# Patient Record
Sex: Female | Born: 1935 | Race: White | Hispanic: No | Smoking: Never smoker
Health system: Southern US, Community
[De-identification: ages and names within clinical notes are randomized; demographics above are authoritative.]

## PROBLEM LIST (undated history)

## (undated) DIAGNOSIS — I5189 Other ill-defined heart diseases: Secondary | ICD-10-CM

## (undated) DIAGNOSIS — I10 Essential (primary) hypertension: Secondary | ICD-10-CM

## (undated) DIAGNOSIS — M1990 Unspecified osteoarthritis, unspecified site: Secondary | ICD-10-CM

## (undated) DIAGNOSIS — M549 Dorsalgia, unspecified: Secondary | ICD-10-CM

## (undated) DIAGNOSIS — N8110 Cystocele, unspecified: Secondary | ICD-10-CM

## (undated) DIAGNOSIS — D509 Iron deficiency anemia, unspecified: Secondary | ICD-10-CM

## (undated) DIAGNOSIS — I5032 Chronic diastolic (congestive) heart failure: Secondary | ICD-10-CM

## (undated) DIAGNOSIS — I517 Cardiomegaly: Secondary | ICD-10-CM

## (undated) DIAGNOSIS — R51 Headache: Secondary | ICD-10-CM

## (undated) DIAGNOSIS — D329 Benign neoplasm of meninges, unspecified: Secondary | ICD-10-CM

## (undated) DIAGNOSIS — G8929 Other chronic pain: Secondary | ICD-10-CM

## (undated) HISTORY — DX: Other ill-defined heart diseases: I51.89

## (undated) HISTORY — DX: Hypercalcemia: E83.52

## (undated) HISTORY — PX: OTHER SURGICAL HISTORY: SHX169

## (undated) HISTORY — PX: ABDOMINAL HYSTERECTOMY: SHX81

---

## 1999-02-17 ENCOUNTER — Encounter: Payer: Self-pay | Admitting: Orthopedic Surgery

## 1999-02-18 ENCOUNTER — Encounter: Payer: Self-pay | Admitting: Orthopedic Surgery

## 1999-02-18 ENCOUNTER — Inpatient Hospital Stay (HOSPITAL_COMMUNITY): Admission: RE | Admit: 1999-02-18 | Discharge: 1999-02-20 | Payer: Self-pay | Admitting: Orthopedic Surgery

## 2002-06-24 ENCOUNTER — Emergency Department (HOSPITAL_COMMUNITY): Admission: EM | Admit: 2002-06-24 | Discharge: 2002-06-24 | Payer: Self-pay | Admitting: *Deleted

## 2004-01-16 ENCOUNTER — Encounter (HOSPITAL_COMMUNITY): Admission: RE | Admit: 2004-01-16 | Discharge: 2004-02-15 | Payer: Self-pay | Admitting: Preventative Medicine

## 2004-02-04 ENCOUNTER — Ambulatory Visit: Admission: RE | Admit: 2004-02-04 | Discharge: 2004-02-04 | Payer: Self-pay | Admitting: Orthopaedic Surgery

## 2008-05-08 ENCOUNTER — Emergency Department (HOSPITAL_COMMUNITY): Admission: EM | Admit: 2008-05-08 | Discharge: 2008-05-08 | Payer: Self-pay | Admitting: Emergency Medicine

## 2010-06-25 ENCOUNTER — Emergency Department (HOSPITAL_COMMUNITY)
Admission: EM | Admit: 2010-06-25 | Discharge: 2010-06-25 | Payer: Self-pay | Source: Home / Self Care | Admitting: Emergency Medicine

## 2010-07-22 ENCOUNTER — Ambulatory Visit (HOSPITAL_COMMUNITY): Admission: RE | Admit: 2010-07-22 | Payer: Self-pay | Source: Home / Self Care | Admitting: Internal Medicine

## 2010-08-06 ENCOUNTER — Encounter: Payer: Self-pay | Admitting: Internal Medicine

## 2010-09-15 LAB — BASIC METABOLIC PANEL
CO2: 26 mEq/L (ref 19–32)
Chloride: 108 mEq/L (ref 96–112)
GFR calc Af Amer: >60 mL/min (ref >60)
GFR calc non Af Amer: 56 mL/min — ABNORMAL LOW (ref >60)
Potassium: 3.4 mEq/L — ABNORMAL LOW (ref 3.5–5.1)
Sodium: 143 mEq/L (ref 135–145)

## 2010-09-15 LAB — URINE CULTURE: Colony Count: 75000

## 2010-09-15 LAB — CBC
HCT: 39.3 % (ref 36.0–46.0)
MCH: 30.9 pg (ref 26.0–34.0)
MCHC: 33.8 g/dL (ref 30.0–36.0)
Platelets: 258 10*3/uL (ref 150–400)
RBC: 4.31 MIL/uL (ref 3.87–5.11)

## 2010-09-15 LAB — DIFFERENTIAL
Basophils Relative: 1 % (ref 0–1)
Lymphs Abs: 1.2 10*3/uL (ref 0.7–4.0)
Neutro Abs: 5.1 10*3/uL (ref 1.7–7.7)
Neutrophils Relative %: 74 % (ref 43–77)

## 2010-09-15 LAB — URINALYSIS, ROUTINE W REFLEX MICROSCOPIC
Hgb urine dipstick: NEGATIVE
Nitrite: NEGATIVE
Protein, ur: NEGATIVE mg/dL
Urobilinogen, UA: 0.2 mg/dL (ref 0.0–1.0)

## 2010-12-17 ENCOUNTER — Emergency Department (HOSPITAL_COMMUNITY): Payer: Medicare Other

## 2010-12-17 ENCOUNTER — Emergency Department (HOSPITAL_COMMUNITY)
Admission: EM | Admit: 2010-12-17 | Discharge: 2010-12-17 | Disposition: A | Discharge status: Home / Self Care | Payer: Medicare Other | Attending: Emergency Medicine | Admitting: Emergency Medicine

## 2010-12-17 DIAGNOSIS — I4949 Other premature depolarization: Secondary | ICD-10-CM | POA: Insufficient documentation

## 2010-12-17 DIAGNOSIS — I10 Essential (primary) hypertension: Secondary | ICD-10-CM | POA: Insufficient documentation

## 2010-12-17 DIAGNOSIS — J984 Other disorders of lung: Secondary | ICD-10-CM | POA: Insufficient documentation

## 2010-12-17 DIAGNOSIS — I700 Atherosclerosis of aorta: Secondary | ICD-10-CM | POA: Insufficient documentation

## 2010-12-17 DIAGNOSIS — M546 Pain in thoracic spine: Secondary | ICD-10-CM | POA: Insufficient documentation

## 2010-12-17 DIAGNOSIS — I252 Old myocardial infarction: Secondary | ICD-10-CM | POA: Insufficient documentation

## 2010-12-17 DIAGNOSIS — M129 Arthropathy, unspecified: Secondary | ICD-10-CM | POA: Insufficient documentation

## 2010-12-17 DIAGNOSIS — R071 Chest pain on breathing: Secondary | ICD-10-CM | POA: Insufficient documentation

## 2010-12-17 LAB — URINALYSIS, ROUTINE W REFLEX MICROSCOPIC
Bilirubin Urine: NEGATIVE
Glucose, UA: NEGATIVE mg/dL
Ketones, ur: NEGATIVE mg/dL
Leukocytes, UA: NEGATIVE
Specific Gravity, Urine: 1.015 (ref 1.005–1.030)
Urobilinogen, UA: 0.2 mg/dL (ref 0.0–1.0)
pH: 6.5 (ref 5.0–8.0)

## 2011-01-16 ENCOUNTER — Emergency Department (HOSPITAL_COMMUNITY)
Admission: EM | Admit: 2011-01-16 | Discharge: 2011-01-16 | Disposition: A | Discharge status: Home / Self Care | Payer: Medicare Other | Attending: Emergency Medicine | Admitting: Emergency Medicine

## 2011-01-16 ENCOUNTER — Encounter: Payer: Self-pay | Admitting: Emergency Medicine

## 2011-01-16 DIAGNOSIS — M546 Pain in thoracic spine: Secondary | ICD-10-CM | POA: Insufficient documentation

## 2011-01-16 DIAGNOSIS — M549 Dorsalgia, unspecified: Secondary | ICD-10-CM

## 2011-01-16 DIAGNOSIS — I10 Essential (primary) hypertension: Secondary | ICD-10-CM | POA: Insufficient documentation

## 2011-01-16 HISTORY — DX: Dorsalgia, unspecified: M54.9

## 2011-01-16 HISTORY — DX: Essential (primary) hypertension: I10

## 2011-01-16 HISTORY — DX: Other chronic pain: G89.29

## 2011-01-16 HISTORY — DX: Headache: R51

## 2011-01-16 HISTORY — DX: Unspecified osteoarthritis, unspecified site: M19.90

## 2011-01-16 MED ORDER — DIAZEPAM 5 MG PO TABS
5.0000 mg | ORAL_TABLET | Freq: Once | ORAL | Status: AC
  Administered 2011-01-16: 5 mg via ORAL
  Filled 2011-01-16: qty 1

## 2011-01-16 MED ORDER — OXYCODONE-ACETAMINOPHEN 5-325 MG PO TABS: 2.0000 | ORAL_TABLET | ORAL | Status: AC | PRN

## 2011-01-16 MED ORDER — METHOCARBAMOL 500 MG PO TABS: 500.0000 mg | ORAL_TABLET | Freq: Two times a day (BID) | ORAL | Status: AC

## 2011-01-16 MED ORDER — OXYCODONE-ACETAMINOPHEN 5-325 MG PO TABS
1.0000 | ORAL_TABLET | Freq: Once | ORAL | Status: AC
  Administered 2011-01-16: 1 via ORAL
  Filled 2011-01-16: qty 1

## 2011-01-16 NOTE — ED Notes (Signed)
Patient had to use restroom - urine specimen collected in case was ordered

## 2011-01-16 NOTE — ED Notes (Signed)
Pt c/o her chronic back pain since last night. Pain to L side mid area. States has this pain a lot. This episode started last night. Nad. C/o dizziness starting pta.

## 2011-01-16 NOTE — ED Provider Notes (Signed)
History     Chief Complaint  Patient presents with  . Back Pain   Patient is a 77 y.o. female presenting with back pain. The history is provided by the patient.  Back Pain  This is a chronic problem. The current episode started more than 1 week ago. The problem occurs constantly. The problem has not changed since onset.The pain is associated with no known injury. The pain is present in the thoracic spine. The quality of the pain is described as stabbing. The pain does not radiate. The pain is at a severity of 6/10. The pain is moderate. The symptoms are aggravated by bending and twisting. The pain is the same all the time. Stiffness is present all day. Pertinent negatives include no numbness, no bowel incontinence, no leg pain, no paresthesias, no tingling and no weakness. She has tried muscle relaxants for the symptoms. The treatment provided no relief.    Past Medical History  Diagnosis Date  . Back pain, chronic   . Headache   . Hypertension   . Arthritis     Past Surgical History  Procedure Date  . Abdominal hysterectomy   . Arm surgery     History reviewed. No pertinent family history.  History  Substance Use Topics  . Smoking status: Not on file  . Smokeless tobacco: Not on file  . Alcohol Use: No    OB History    Grav Para Term Preterm Abortions TAB SAB Ect Mult Living                  Review of Systems  Gastrointestinal: Negative for bowel incontinence.  Musculoskeletal: Positive for back pain.  Neurological: Negative for tingling, weakness, numbness and paresthesias.  All other systems reviewed and are negative.    Physical Exam  BP 166/79  Pulse 66  Temp(Src) 98 F (36.7 C) (Oral)  Resp 16  Ht 5\' 1"  (1.549 m)  Wt 102 lb (46.267 kg)  BMI 19.27 kg/m2  SpO2 99%  Physical Exam  Nursing note and vitals reviewed. Constitutional: She is oriented to person, place, and time. She appears well-developed and well-nourished.  Non-toxic appearance.  HENT:    Head: Normocephalic and atraumatic.  Eyes: Conjunctivae are normal. Pupils are equal, round, and reactive to light.  Neck: Normal range of motion.  Cardiovascular: Normal rate.   Pulmonary/Chest: Effort normal.  Abdominal: Soft. Normal appearance.  Musculoskeletal:       Thoracic back: She exhibits tenderness, pain and spasm. She exhibits no bony tenderness and no deformity.  Neurological: She is alert and oriented to person, place, and time. She has normal strength and normal reflexes. No sensory deficit.  Skin: Skin is warm and dry.  Psychiatric: She has a normal mood and affect.    ED Course  Procedures  MDM   Pt given pain meds and feels better, ambulatory      Toy Baker, MD 01/16/11 1620

## 2011-01-16 NOTE — ED Notes (Signed)
Pt standing at door, dressed. States she feels better and is ready to go. edp aware

## 2011-01-26 ENCOUNTER — Other Ambulatory Visit (HOSPITAL_COMMUNITY): Payer: Self-pay | Admitting: Orthopaedic Surgery

## 2011-01-26 DIAGNOSIS — M549 Dorsalgia, unspecified: Secondary | ICD-10-CM

## 2011-01-28 ENCOUNTER — Encounter (HOSPITAL_COMMUNITY)
Admission: RE | Admit: 2011-01-28 | Discharge: 2011-01-28 | Disposition: A | Discharge status: Home / Self Care | Payer: Medicare Other | Source: Ambulatory Visit | Attending: Orthopaedic Surgery | Admitting: Orthopaedic Surgery

## 2011-01-28 ENCOUNTER — Encounter (HOSPITAL_COMMUNITY): Payer: Self-pay

## 2011-01-28 DIAGNOSIS — M4120 Other idiopathic scoliosis, site unspecified: Secondary | ICD-10-CM | POA: Insufficient documentation

## 2011-01-28 DIAGNOSIS — M549 Dorsalgia, unspecified: Secondary | ICD-10-CM | POA: Insufficient documentation

## 2011-01-28 MED ORDER — TECHNETIUM TC 99M MEDRONATE IV KIT
25.0000 | PACK | Freq: Once | INTRAVENOUS | Status: AC | PRN
  Administered 2011-01-28: 25.1 via INTRAVENOUS

## 2011-04-08 LAB — POCT CARDIAC MARKERS: Myoglobin, poc: 174

## 2011-04-08 LAB — CREATININE, SERUM: GFR calc non Af Amer: >60

## 2011-07-06 ENCOUNTER — Other Ambulatory Visit (HOSPITAL_COMMUNITY): Payer: Self-pay | Admitting: Orthopaedic Surgery

## 2011-07-06 DIAGNOSIS — M8430XA Stress fracture, unspecified site, initial encounter for fracture: Secondary | ICD-10-CM

## 2011-07-08 ENCOUNTER — Encounter (HOSPITAL_COMMUNITY): Payer: Self-pay

## 2011-07-08 ENCOUNTER — Encounter (HOSPITAL_COMMUNITY)
Admission: RE | Admit: 2011-07-08 | Discharge: 2011-07-08 | Disposition: A | Discharge status: Home / Self Care | Payer: Medicare Other | Source: Ambulatory Visit | Attending: Orthopaedic Surgery | Admitting: Orthopaedic Surgery

## 2011-07-08 DIAGNOSIS — M8430XA Stress fracture, unspecified site, initial encounter for fracture: Secondary | ICD-10-CM | POA: Insufficient documentation

## 2011-07-08 MED ORDER — TECHNETIUM TC 99M MEDRONATE IV KIT
25.0000 | PACK | Freq: Once | INTRAVENOUS | Status: AC | PRN
  Administered 2011-07-08: 25 via INTRAVENOUS

## 2012-02-14 ENCOUNTER — Emergency Department (HOSPITAL_COMMUNITY)
Admission: EM | Admit: 2012-02-14 | Discharge: 2012-02-14 | Disposition: A | Discharge status: Home / Self Care | Payer: Medicare Other | Attending: Emergency Medicine | Admitting: Emergency Medicine

## 2012-02-14 ENCOUNTER — Encounter (HOSPITAL_COMMUNITY): Payer: Self-pay | Admitting: Emergency Medicine

## 2012-02-14 ENCOUNTER — Emergency Department (HOSPITAL_COMMUNITY): Payer: Medicare Other

## 2012-02-14 DIAGNOSIS — I10 Essential (primary) hypertension: Secondary | ICD-10-CM | POA: Insufficient documentation

## 2012-02-14 DIAGNOSIS — M549 Dorsalgia, unspecified: Secondary | ICD-10-CM

## 2012-02-14 DIAGNOSIS — K5900 Constipation, unspecified: Secondary | ICD-10-CM | POA: Insufficient documentation

## 2012-02-14 DIAGNOSIS — M546 Pain in thoracic spine: Secondary | ICD-10-CM | POA: Insufficient documentation

## 2012-02-14 DIAGNOSIS — G8929 Other chronic pain: Secondary | ICD-10-CM | POA: Insufficient documentation

## 2012-02-14 MED ORDER — PEG 3350-KCL-NABCB-NACL-NASULF 236 G PO SOLR: ORAL | Status: DC

## 2012-02-14 MED ORDER — HYDROCODONE-ACETAMINOPHEN 5-325 MG PO TABS: 1.0000 | ORAL_TABLET | ORAL | Status: AC | PRN

## 2012-02-14 NOTE — ED Provider Notes (Signed)
History   This chart was scribed for [name], MD scribed by Magnus Sinning. The patient was seen in room APA10/APA10 at 15:08    CSN: [id number]  Arrival date & time 02/14/12  1406   First MD Initiated Contact with Patient 02/14/12 1504      Chief Complaint  Patient presents with  . Back Pain    (Consider location/radiation/quality/duration/timing/severity/associated sxs/prior treatment) HPI Madison Hickman is a 78 y.o. female who presents to the Emergency Department complaining of gradually worsening chronic back pain ,located at  bilateral mid back , with associated constipation, onset yesterday. Patient explains she was constipated yesterday, and that she was relieved after taking a laxative. She explains shortly after taking the supplement and having BMs she began having increasingly worse back pain. Pt states she has history of gradually worsening arthritis in her back . Explains the back pain began 10 years ago on the left side and now intermittently moves from left to right. Pt reports she has been taking Vicodin provided by Dr. Hilda Lias  for treatment of back pain with moderate relief.    Orthopedic Surgeon: Dr. Hilda Lias      Past Medical History  Diagnosis Date  . Back pain, chronic   . Headache   . Hypertension   . Arthritis     Past Surgical History  Procedure Date  . Abdominal hysterectomy   . Arm surgery     History reviewed. No pertinent family history.  History  Substance Use Topics  . Smoking status: Not on file  . Smokeless tobacco: Not on file  . Alcohol Use: No    Review of Systems  All other systems reviewed and are negative.    Allergies  Review of patient's allergies indicates no known allergies.  Home Medications   Current Outpatient Rx  Name Route Sig Dispense Refill  . TRAMADOL HCL ER 100 MG PO TB24 Oral Take 100 mg by mouth daily.      Marland Kitchen UNKNOWN TO PATIENT  BP pill       BP 168/114  Pulse 81  Temp 98.4 F (36.9 C) (Oral)   Resp 18  SpO2 96%  Physical Exam  Nursing note and vitals reviewed. Constitutional: She is oriented to person, place, and time. She appears well-developed and well-nourished. No distress.  HENT:  Head: Normocephalic and atraumatic.  Eyes: Conjunctivae and EOM are normal.  Neck: Neck supple. No tracheal deviation present.  Cardiovascular: Normal rate.   Pulmonary/Chest: Effort normal. No respiratory distress.  Abdominal: She exhibits no distension.  Musculoskeletal: Normal range of motion.  Neurological: She is alert and oriented to person, place, and time. No sensory deficit.  Skin: Skin is dry.  Psychiatric: She has a normal mood and affect. Her behavior is normal.    ED Course  Procedures (including critical care time) DIAGNOSTIC STUDIES: Oxygen Saturation is 96% on room air, normal by my interpretation.    COORDINATION OF CARE:  Dg Abd 1 View  02/14/2012  *RADIOLOGY REPORT*  Clinical Data: Back pain.  ABDOMEN - 1 VIEW  Comparison: 05/08/2008  Findings: Prominent lumbar scoliosis and spondylosis noted.  Cardiomegaly is present. Prominence of stool throughout the colon suggests constipation.  No dilated small bowel is observed.  IMPRESSION:  1.  Prominent lumbar spondylosis and scoliosis. 2.  Prominence of stool throughout the colon suggests constipation. 3.  Cardiomegaly.  Original Report Authenticated By: Dellia Cloud, M.D.     1. Back pain   2. Constipation  MDM  Constipation, aggravated by use of narcotics. KUB x-ray will be obtained to evaluate stool burden.  KUB confirms a large amount of stool in the colon. She'll be sent home with prescription for GoLYTELY prescription for Norco. Active management of constipation.  No exam data present  [name], MD 02/14/12 1610

## 2012-02-14 NOTE — ED Notes (Signed)
Pt comes from home with c/o back pain and "constipation". Pt states she's had back pain "for years" but it is worse now. Pt has been taking percocet for the pain but it is no longer effective and her PCP doesn't want to give her anything else stronger. Pt states that she was constipated but her family gave her meds for the constipation and she is now having BMs. Pt states BMs are more formed than usual and painful but denies any blood in stool. Pt is A&O x4.

## 2012-02-14 NOTE — ED Notes (Signed)
Pt ambulated to restroom with assistance.  

## 2012-04-05 DIAGNOSIS — I5189 Other ill-defined heart diseases: Secondary | ICD-10-CM

## 2012-04-05 HISTORY — DX: Other ill-defined heart diseases: I51.89

## 2012-04-15 ENCOUNTER — Encounter (HOSPITAL_COMMUNITY): Payer: Self-pay

## 2012-04-15 ENCOUNTER — Inpatient Hospital Stay (HOSPITAL_COMMUNITY)
Admission: EM | Admit: 2012-04-15 | Discharge: 2012-04-20 | DRG: 291 | Disposition: A | Discharge status: Skilled Nursing Facility | Payer: Medicare Other | Attending: Internal Medicine | Admitting: Internal Medicine

## 2012-04-15 ENCOUNTER — Emergency Department (HOSPITAL_COMMUNITY): Payer: Medicare Other

## 2012-04-15 ENCOUNTER — Inpatient Hospital Stay (HOSPITAL_COMMUNITY): Payer: Medicare Other

## 2012-04-15 DIAGNOSIS — E611 Iron deficiency: Secondary | ICD-10-CM

## 2012-04-15 DIAGNOSIS — E876 Hypokalemia: Secondary | ICD-10-CM | POA: Diagnosis present

## 2012-04-15 DIAGNOSIS — R410 Disorientation, unspecified: Secondary | ICD-10-CM

## 2012-04-15 DIAGNOSIS — A498 Other bacterial infections of unspecified site: Secondary | ICD-10-CM | POA: Diagnosis present

## 2012-04-15 DIAGNOSIS — M129 Arthropathy, unspecified: Secondary | ICD-10-CM | POA: Diagnosis present

## 2012-04-15 DIAGNOSIS — Z7401 Bed confinement status: Secondary | ICD-10-CM

## 2012-04-15 DIAGNOSIS — R519 Headache, unspecified: Secondary | ICD-10-CM

## 2012-04-15 DIAGNOSIS — K2960 Other gastritis without bleeding: Secondary | ICD-10-CM | POA: Diagnosis present

## 2012-04-15 DIAGNOSIS — G92 Toxic encephalopathy: Secondary | ICD-10-CM | POA: Diagnosis present

## 2012-04-15 DIAGNOSIS — I10 Essential (primary) hypertension: Secondary | ICD-10-CM

## 2012-04-15 DIAGNOSIS — D649 Anemia, unspecified: Secondary | ICD-10-CM

## 2012-04-15 DIAGNOSIS — I5032 Chronic diastolic (congestive) heart failure: Secondary | ICD-10-CM | POA: Diagnosis present

## 2012-04-15 DIAGNOSIS — I359 Nonrheumatic aortic valve disorder, unspecified: Secondary | ICD-10-CM

## 2012-04-15 DIAGNOSIS — M549 Dorsalgia, unspecified: Secondary | ICD-10-CM | POA: Diagnosis present

## 2012-04-15 DIAGNOSIS — G936 Cerebral edema: Secondary | ICD-10-CM | POA: Diagnosis present

## 2012-04-15 DIAGNOSIS — R404 Transient alteration of awareness: Secondary | ICD-10-CM | POA: Diagnosis present

## 2012-04-15 DIAGNOSIS — C700 Malignant neoplasm of cerebral meninges: Secondary | ICD-10-CM | POA: Diagnosis present

## 2012-04-15 DIAGNOSIS — D509 Iron deficiency anemia, unspecified: Secondary | ICD-10-CM | POA: Diagnosis present

## 2012-04-15 DIAGNOSIS — I509 Heart failure, unspecified: Secondary | ICD-10-CM

## 2012-04-15 DIAGNOSIS — I959 Hypotension, unspecified: Secondary | ICD-10-CM | POA: Diagnosis present

## 2012-04-15 DIAGNOSIS — Z90710 Acquired absence of both cervix and uterus: Secondary | ICD-10-CM

## 2012-04-15 DIAGNOSIS — G929 Unspecified toxic encephalopathy: Secondary | ICD-10-CM | POA: Diagnosis present

## 2012-04-15 DIAGNOSIS — R1319 Other dysphagia: Secondary | ICD-10-CM | POA: Diagnosis present

## 2012-04-15 DIAGNOSIS — E538 Deficiency of other specified B group vitamins: Secondary | ICD-10-CM | POA: Diagnosis present

## 2012-04-15 DIAGNOSIS — D329 Benign neoplasm of meninges, unspecified: Secondary | ICD-10-CM | POA: Diagnosis present

## 2012-04-15 DIAGNOSIS — Z66 Do not resuscitate: Secondary | ICD-10-CM | POA: Diagnosis present

## 2012-04-15 DIAGNOSIS — N390 Urinary tract infection, site not specified: Secondary | ICD-10-CM | POA: Diagnosis present

## 2012-04-15 DIAGNOSIS — R1310 Dysphagia, unspecified: Secondary | ICD-10-CM | POA: Diagnosis present

## 2012-04-15 DIAGNOSIS — R51 Headache: Secondary | ICD-10-CM | POA: Diagnosis present

## 2012-04-15 DIAGNOSIS — K5900 Constipation, unspecified: Secondary | ICD-10-CM | POA: Diagnosis present

## 2012-04-15 DIAGNOSIS — I5031 Acute diastolic (congestive) heart failure: Principal | ICD-10-CM | POA: Diagnosis present

## 2012-04-15 DIAGNOSIS — R6881 Early satiety: Secondary | ICD-10-CM | POA: Diagnosis present

## 2012-04-15 DIAGNOSIS — Z23 Encounter for immunization: Secondary | ICD-10-CM

## 2012-04-15 DIAGNOSIS — Z87891 Personal history of nicotine dependence: Secondary | ICD-10-CM

## 2012-04-15 DIAGNOSIS — Z79899 Other long term (current) drug therapy: Secondary | ICD-10-CM

## 2012-04-15 DIAGNOSIS — K219 Gastro-esophageal reflux disease without esophagitis: Secondary | ICD-10-CM | POA: Diagnosis present

## 2012-04-15 DIAGNOSIS — R079 Chest pain, unspecified: Secondary | ICD-10-CM

## 2012-04-15 DIAGNOSIS — G8929 Other chronic pain: Secondary | ICD-10-CM | POA: Diagnosis present

## 2012-04-15 HISTORY — DX: Benign neoplasm of meninges, unspecified: D32.9

## 2012-04-15 HISTORY — DX: Iron deficiency anemia, unspecified: D50.9

## 2012-04-15 LAB — HEPATIC FUNCTION PANEL
ALT: 10 U/L (ref 0–35)
Albumin: 2.9 g/dL — ABNORMAL LOW (ref 3.5–5.2)
Alkaline Phosphatase: 56 U/L (ref 39–117)
Indirect Bilirubin: 0.3 mg/dL (ref 0.3–0.9)
Total Bilirubin: 0.4 mg/dL (ref 0.3–1.2)
Total Protein: 5.7 g/dL — ABNORMAL LOW (ref 6.0–8.3)

## 2012-04-15 LAB — CBC WITH DIFFERENTIAL/PLATELET
Basophils Relative: 1 % (ref 0–1)
HCT: 23.6 % — ABNORMAL LOW (ref 36.0–46.0)
Hemoglobin: 7.1 g/dL — ABNORMAL LOW (ref 12.0–15.0)
Lymphocytes Relative: 24 % (ref 12–46)
Lymphs Abs: 1.1 10*3/uL (ref 0.7–4.0)
MCHC: 30.1 g/dL (ref 30.0–36.0)
Monocytes Absolute: 0.5 10*3/uL (ref 0.1–1.0)
Monocytes Relative: 10 % (ref 3–12)
Neutro Abs: 2.8 10*3/uL (ref 1.7–7.7)
Neutrophils Relative %: 61 % (ref 43–77)
RBC: 3.14 MIL/uL — ABNORMAL LOW (ref 3.87–5.11)
WBC: 4.6 10*3/uL (ref 4.0–10.5)

## 2012-04-15 LAB — VITAMIN B12: Vitamin B-12: 163 pg/mL — ABNORMAL LOW (ref 211–911)

## 2012-04-15 LAB — BASIC METABOLIC PANEL
BUN: 15 mg/dL (ref 6–23)
CO2: 28 mEq/L (ref 19–32)
Chloride: 109 mEq/L (ref 96–112)
GFR calc Af Amer: 85 mL/min — ABNORMAL LOW (ref >90)
Potassium: 2.8 mEq/L — ABNORMAL LOW (ref 3.5–5.1)

## 2012-04-15 LAB — URINALYSIS, ROUTINE W REFLEX MICROSCOPIC
Glucose, UA: NEGATIVE mg/dL
Leukocytes, UA: NEGATIVE
Protein, ur: NEGATIVE mg/dL
Specific Gravity, Urine: 1.015 (ref 1.005–1.030)
Urobilinogen, UA: 0.2 mg/dL (ref 0.0–1.0)

## 2012-04-15 LAB — URINE MICROSCOPIC-ADD ON

## 2012-04-15 LAB — FOLATE: Folate: >20 ng/mL

## 2012-04-15 LAB — TYPE AND SCREEN: Antibody Screen: NEGATIVE

## 2012-04-15 LAB — RETICULOCYTES
RBC.: 3.45 MIL/uL — ABNORMAL LOW (ref 3.87–5.11)
Retic Count, Absolute: 41.4 10*3/uL (ref 19.0–186.0)

## 2012-04-15 LAB — MRSA PCR SCREENING: MRSA by PCR: NEGATIVE

## 2012-04-15 LAB — TROPONIN I: Troponin I: <0.3 ng/mL (ref <0.30)

## 2012-04-15 MED ORDER — PNEUMOCOCCAL VAC POLYVALENT 25 MCG/0.5ML IJ INJ
0.5000 mL | INJECTION | Freq: Once | INTRAMUSCULAR | Status: AC
  Administered 2012-04-15: 0.5 mL via INTRAMUSCULAR
  Filled 2012-04-15: qty 0.5

## 2012-04-15 MED ORDER — SODIUM CHLORIDE 0.9 % IJ SOLN
3.0000 mL | Freq: Two times a day (BID) | INTRAMUSCULAR | Status: DC
  Administered 2012-04-15 – 2012-04-18 (×4): 3 mL via INTRAVENOUS
  Filled 2012-04-15 (×5): qty 3

## 2012-04-15 MED ORDER — NITROGLYCERIN IN D5W 200-5 MCG/ML-% IV SOLN
2.0000 ug/min | INTRAVENOUS | Status: DC
  Administered 2012-04-15: 5 ug/min via INTRAVENOUS
  Filled 2012-04-15: qty 250

## 2012-04-15 MED ORDER — ACETAMINOPHEN 325 MG PO TABS
650.0000 mg | ORAL_TABLET | Freq: Four times a day (QID) | ORAL | Status: DC | PRN
  Administered 2012-04-15 – 2012-04-19 (×3): 650 mg via ORAL
  Filled 2012-04-15 (×3): qty 2

## 2012-04-15 MED ORDER — ONDANSETRON HCL 4 MG PO TABS: 4.0000 mg | ORAL_TABLET | Freq: Four times a day (QID) | ORAL | Status: DC | PRN

## 2012-04-15 MED ORDER — ASPIRIN 81 MG PO CHEW
324.0000 mg | CHEWABLE_TABLET | Freq: Once | ORAL | Status: AC
  Administered 2012-04-15: 324 mg via ORAL
  Filled 2012-04-15: qty 4

## 2012-04-15 MED ORDER — POTASSIUM CHLORIDE CRYS ER 20 MEQ PO TBCR
40.0000 meq | EXTENDED_RELEASE_TABLET | Freq: Three times a day (TID) | ORAL | Status: DC
  Administered 2012-04-15 (×2): 40 meq via ORAL
  Filled 2012-04-15 (×2): qty 2

## 2012-04-15 MED ORDER — NITROGLYCERIN 0.4 MG SL SUBL
0.4000 mg | SUBLINGUAL_TABLET | Freq: Once | SUBLINGUAL | Status: AC
  Administered 2012-04-15: 0.4 mg via SUBLINGUAL
  Filled 2012-04-15: qty 25

## 2012-04-15 MED ORDER — TRAMADOL HCL 50 MG PO TABS
50.0000 mg | ORAL_TABLET | Freq: Once | ORAL | Status: AC
  Administered 2012-04-15: 50 mg via ORAL

## 2012-04-15 MED ORDER — ACETAMINOPHEN 650 MG RE SUPP: 650.0000 mg | Freq: Four times a day (QID) | RECTAL | Status: DC | PRN

## 2012-04-15 MED ORDER — POTASSIUM CHLORIDE 10 MEQ/100ML IV SOLN
10.0000 meq | Freq: Once | INTRAVENOUS | Status: AC
  Administered 2012-04-15: 10 meq via INTRAVENOUS
  Filled 2012-04-15: qty 100

## 2012-04-15 MED ORDER — NITROGLYCERIN IN D5W 200-5 MCG/ML-% IV SOLN
5.0000 ug/min | Freq: Once | INTRAVENOUS | Status: AC
  Administered 2012-04-15: 5 ug/min via INTRAVENOUS
  Filled 2012-04-15: qty 250

## 2012-04-15 MED ORDER — ASPIRIN EC 81 MG PO TBEC
81.0000 mg | DELAYED_RELEASE_TABLET | Freq: Every day | ORAL | Status: DC
  Administered 2012-04-16: 81 mg via ORAL
  Filled 2012-04-15: qty 1

## 2012-04-15 MED ORDER — INFLUENZA VIRUS VACC SPLIT PF IM SUSP
0.2500 mL | Freq: Once | INTRAMUSCULAR | Status: AC
  Administered 2012-04-15: 0.25 mL via INTRAMUSCULAR
  Filled 2012-04-15: qty 0.25

## 2012-04-15 MED ORDER — FUROSEMIDE 10 MG/ML IJ SOLN
40.0000 mg | Freq: Two times a day (BID) | INTRAMUSCULAR | Status: DC
  Administered 2012-04-15 – 2012-04-16 (×2): 40 mg via INTRAVENOUS
  Filled 2012-04-15 (×2): qty 4

## 2012-04-15 MED ORDER — NITROFURANTOIN MONOHYD MACRO 100 MG PO CAPS
100.0000 mg | ORAL_CAPSULE | Freq: Two times a day (BID) | ORAL | Status: DC
  Administered 2012-04-15 – 2012-04-16 (×2): 100 mg via ORAL
  Filled 2012-04-15 (×2): qty 1

## 2012-04-15 MED ORDER — TRAMADOL HCL 50 MG PO TABS
50.0000 mg | ORAL_TABLET | Freq: Four times a day (QID) | ORAL | Status: DC | PRN
  Administered 2012-04-15: 50 mg via ORAL
  Filled 2012-04-15 (×2): qty 1

## 2012-04-15 MED ORDER — MAGNESIUM HYDROXIDE 400 MG/5ML PO SUSP
30.0000 mL | Freq: Every day | ORAL | Status: DC | PRN
  Administered 2012-04-15: 30 mL via ORAL
  Filled 2012-04-15: qty 30

## 2012-04-15 MED ORDER — ALUM & MAG HYDROXIDE-SIMETH 200-200-20 MG/5ML PO SUSP
30.0000 mL | Freq: Four times a day (QID) | ORAL | Status: DC | PRN
  Administered 2012-04-15 – 2012-04-16 (×2): 30 mL via ORAL
  Filled 2012-04-15 (×2): qty 30

## 2012-04-15 MED ORDER — DOCUSATE SODIUM 100 MG PO CAPS
100.0000 mg | ORAL_CAPSULE | Freq: Two times a day (BID) | ORAL | Status: DC
  Administered 2012-04-15 – 2012-04-16 (×3): 100 mg via ORAL
  Filled 2012-04-15 (×4): qty 1

## 2012-04-15 MED ORDER — ALBUTEROL SULFATE (5 MG/ML) 0.5% IN NEBU: 2.5000 mg | INHALATION_SOLUTION | RESPIRATORY_TRACT | Status: DC | PRN

## 2012-04-15 MED ORDER — POTASSIUM CHLORIDE CRYS ER 20 MEQ PO TBCR
40.0000 meq | EXTENDED_RELEASE_TABLET | Freq: Once | ORAL | Status: AC
  Administered 2012-04-15: 40 meq via ORAL
  Filled 2012-04-15: qty 2

## 2012-04-15 MED ORDER — TRAMADOL HCL 50 MG PO TABS
ORAL_TABLET | ORAL | Status: AC
  Administered 2012-04-15: 50 mg via ORAL
  Filled 2012-04-15: qty 1

## 2012-04-15 MED ORDER — FUROSEMIDE 10 MG/ML IJ SOLN
20.0000 mg | Freq: Once | INTRAMUSCULAR | Status: AC
  Administered 2012-04-15: 20 mg via INTRAVENOUS

## 2012-04-15 MED ORDER — SODIUM CHLORIDE 0.9 % IJ SOLN
INTRAMUSCULAR | Status: AC
  Filled 2012-04-15: qty 3

## 2012-04-15 MED ORDER — DEXTROSE 5 % IV SOLN
1.0000 g | Freq: Once | INTRAVENOUS | Status: AC
  Administered 2012-04-15: 1 g via INTRAVENOUS
  Filled 2012-04-15: qty 10

## 2012-04-15 MED ORDER — ONDANSETRON HCL 4 MG/2ML IJ SOLN
4.0000 mg | Freq: Four times a day (QID) | INTRAMUSCULAR | Status: DC | PRN
  Administered 2012-04-15 (×2): 4 mg via INTRAVENOUS
  Filled 2012-04-15 (×2): qty 2

## 2012-04-15 MED ORDER — LISINOPRIL 10 MG PO TABS
10.0000 mg | ORAL_TABLET | Freq: Every day | ORAL | Status: DC
  Administered 2012-04-15: 10 mg via ORAL
  Filled 2012-04-15: qty 1

## 2012-04-15 MED ORDER — FUROSEMIDE 10 MG/ML IJ SOLN
20.0000 mg | Freq: Once | INTRAMUSCULAR | Status: DC
  Filled 2012-04-15: qty 2
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
## 2012-04-15 NOTE — ED Provider Notes (Signed)
History    This chart was scribed for Madison Booze, MD, MD by Smitty Pluck. The patient was seen in room APA14 and the patient's care was started at 7:17AM.   CSN: [id number]  Arrival date & time 04/15/12  4098      Chief Complaint  Patient presents with  . Shortness of Breath    (Consider location/radiation/quality/duration/timing/severity/associated sxs/prior treatment) Patient is a 78 y.o. female presenting with shortness of breath. The history is provided by the patient. No language interpreter was used.  Shortness of Breath  Associated symptoms include shortness of breath.   Madison Hickman is a 78 y.o. female who presents to the Emergency Department complaining of constant, moderate SOB onset 1 day ago at night. Pt reports having chest heaviness and nausea. Pt reports that walking relieves symptoms. Pt denies diaphoresis, cough, vomiting, fever and chills. She reports having swelling in bilateral legs.   PCP is Dr. Fransico Him   Past Medical History  Diagnosis Date  . Back pain, chronic   . Headache   . Hypertension   . Arthritis   . Atrial fibrillation     Past Surgical History  Procedure Date  . Abdominal hysterectomy   . Arm surgery     No family history on file.  History  Substance Use Topics  . Smoking status: Not on file  . Smokeless tobacco: Not on file  . Alcohol Use: No    OB History    Grav Para Term Preterm Abortions TAB SAB Ect Mult Living                  Review of Systems  Respiratory: Positive for shortness of breath.   Gastrointestinal: Positive for nausea.       Constipation  All other systems reviewed and are negative.    Allergies  Review of patient's allergies indicates no known allergies.  Home Medications   Current Outpatient Rx  Name Route Sig Dispense Refill  . PEG 3350-KCL-NABCB-NACL-NASULF 236 G PO SOLR  8 ounces every 15 minutes until you are passing clear liquid 4000 mL 0  . TRAMADOL HCL ER 100 MG PO TB24 Oral Take 100 mg  by mouth daily.      Marland Kitchen UNKNOWN TO PATIENT  BP pill       BP 189/85  Pulse 58  Temp 97.9 F (36.6 C) (Oral)  Resp 20  Ht 5\' 7"  (1.702 m)  Wt 95 lb (43.092 kg)  BMI 14.88 kg/m2  SpO2 95%  Physical Exam  Nursing note and vitals reviewed. Constitutional: She is oriented to person, place, and time. She appears well-developed and well-nourished. No distress.  HENT:  Head: Normocephalic and atraumatic.  Eyes: EOM are normal. Pupils are equal, round, and reactive to light.  Neck: Normal range of motion. Neck supple. No tracheal deviation present.  Cardiovascular: Normal rate.        occasional premature beats with loud S4  Pulmonary/Chest: Effort normal. No respiratory distress. She has rales (bibasilar and louder on the left).  Abdominal: Soft. She exhibits no distension. There is no tenderness.  Musculoskeletal: Normal range of motion.       +3 pitting edema   Neurological: She is alert and oriented to person, place, and time.  Skin: Skin is warm and dry. There is pallor.  Psychiatric: She has a normal mood and affect. Her behavior is normal.    ED Course  Procedures (including critical care time) DIAGNOSTIC STUDIES: Oxygen Saturation is 95% on [place],  normal by my interpretation.    COORDINATION OF CARE: 7:24 AM Discussed ED treatment with pt  7:35 AM Ordered:   Medications  ibuprofen (ADVIL,MOTRIN) 200 MG tablet (not administered)  nitroGLYCERIN (NITROSTAT) SL tablet 0.4 mg (0.4 mg Sublingual Given 04/15/12 0741)  aspirin chewable tablet 324 mg (324 mg Oral Given 04/15/12 0741)  furosemide (LASIX) injection 20 mg (20 mg Intravenous Given 04/15/12 0743)       Labs Reviewed  CBC WITH DIFFERENTIAL - Abnormal; Notable for the following:    RBC 3.14 (*)     Hemoglobin 7.1 (*)     HCT 23.6 (*)     MCV 75.2 (*)     MCH 22.6 (*)     RDW 17.2 (*)     All other components within normal limits  BASIC METABOLIC PANEL - Abnormal; Notable for the following:    Potassium 2.8 (*)      GFR calc non Af Amer 73 (*)     GFR calc Af Amer 85 (*)     All other components within normal limits  PRO B NATRIURETIC PEPTIDE - Abnormal; Notable for the following:    Pro B Natriuretic peptide (BNP) 5133.0 (*)     All other components within normal limits  URINALYSIS, ROUTINE W REFLEX MICROSCOPIC - Abnormal; Notable for the following:    APPearance HAZY (*)     Nitrite POSITIVE (*)     All other components within normal limits  URINE MICROSCOPIC-ADD ON - Abnormal; Notable for the following:    Bacteria, UA MANY (*)     All other components within normal limits  TROPONIN I   Dg Chest Portable 1 View  04/15/2012  *RADIOLOGY REPORT*  Clinical Data: Shortness of breath  PORTABLE CHEST - 1 VIEW  Comparison: Prior chest x-ray 11/19/2011  Findings: Interval development of a increased pulmonary vascular congestion, cephalization and indistinctness of the interstitial markings suggesting mild interstitial edema.  Marked enlargement of the cardiopericardial silhouette is similar to prior.  Enlarged main central pulmonary arteries.  Ectatic, tortuous and atherosclerotic thoracic aorta.  Developing linear opacities in the bilateral bases.  Background changes of hyperinflation and chronic coarsening of the interstitial markings persist consistent with underlying COPD/emphysema.  No acute osseous abnormality.  Bones appear osteopenic.  IMPRESSION:  1.  Mild interstitial edema and unchanged cardiomegaly suggest mild CHF. 2.  Background changes of underlying COPD/emphysema persist 3.  Linear opacities the bilateral bases most consistent with subsegmental atelectasis 4.  Ectatic, atherosclerotic and highly tortuous thoracic aorta   Original Report Authenticated By: Sterling Big, M.D.     Date: 04/15/2012  Rate: 62  Rhythm: normal sinus rhythm and sinus arrhythmia  QRS Axis: normal  Intervals: normal  ST/T Wave abnormalities: ST depression in anterolateral leads  Conduction Disutrbances:none   Narrative Interpretation: Left ventricular hypertrophy, ST depression in anterolateral leads worrisome for ischemia. When compared with ECG of 12/17/2010, nonspecific T-wave changes are no longer present, ST depression is now present.  Old EKG Reviewed: changes noted     1. Congestive heart failure   2. Urinary tract infection   3. Hypokalemia   4. Anemia       MDM  Dyspnea which seems most likely to be due to congestive heart failure based on rales and peripheral edema. Chest heaviness is worrisome for angina pectoris. She will be given Lasix and nitroglycerin and reassessed. Cardiac markers have been ordered.  She got good relief from nitroglycerin and has had a good  he diuresis from low-dose furosemide. She states she is feeling much better. Severe anemia is noted and this may be contributing to her congestive heart failure. Rectal exam is done showing suddenly decreased sphincter tone, no masses. Stool was normal color and Hemoccult negative. Anemia panel has been drawn. She will need to be admitted and consideration will need to be given to transfusion. Oral and IV potassium of been given to treat hypokalemia. Ceftriaxone has been given to treat urinary tract infection.  Is discussed with Dr. Lendell Caprice who agrees to admit the patient.  I personally performed the services described in this documentation, which was scribed in my presence. The recorded information has been reviewed and considered.         Madison Booze, MD 04/15/12 [phone]

## 2012-04-15 NOTE — ED Notes (Signed)
Dr. Sullivan at bedside to examine.   

## 2012-04-15 NOTE — ED Notes (Signed)
C/o lower back pain which she states is chronic - states normally takes a pain pill every 4 hours at home for same.  EDP notified and orders rec'd.

## 2012-04-15 NOTE — Progress Notes (Signed)
PT REPORTS AND IS OBSERVED AT THIS TIME TO HAVE MUCH DIFFICULTY SWALLOWING. BECOMES EASILY CHOKED. SHE STATES SHE EATS SO LITTLE AT HOME. DIET CHANGED TO MECHANICAL SOFT FOR DINNER TO SEE IF THIS IMPROVES.

## 2012-04-15 NOTE — ED Notes (Signed)
Sob onset this am, denies cp,
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
## 2012-04-15 NOTE — Progress Notes (Signed)
*  PRELIMINARY RESULTS* Echocardiogram 2D Echocardiogram has been performed.  Madison Hickman M 04/15/2012, 2:26 PM

## 2012-04-15 NOTE — H&P (Signed)
Hospital Admission Note Date: 04/15/2012  Patient name: Madison Hickman Medical record number: [id number] Date of birth: 09-May-1924 Age: 78 y.o. Gender: female PCP: none  Attending physician: No att. providers found  Chief Complaint:   History of Present Illness:  Madison Hickman is an 78 y.o. female who presents to the ED with chest pressure lasting for about an hour. She was bowling close when it occurred. It is currently gone. She has no primary care physician. She has become progressively more short of breath with exertion over the past several weeks. Her legs have become more swollen over the past month. She has chest pain occasionally in the past, but not this severe. She has never had a cardiac workup. Her family members have been encouraging her to see a physician for years, but she only sees Dr. Hilda Lias for her chronic back pain. She has a history of hypertension, but has not been on medications recently. In the emergency room, she was found to have a BNP of over 5000. Hemoglobin 7.1. Heme-negative on rectal exam. Chest x-ray consistent with mild pulmonary edema. Blood pressure initially was 189/85. Urinalysis consistent with urinary tract infection. She was started on a nitroglycerin drip, given Lasix, Rocephin, aspirin. Troponin normal. EKG shows unchanged lateral T-wave inversion. Denies fevers, cough, bleeding. No known endoscopy.  Past Medical History  Diagnosis Date  . Back pain, chronic   . Headache   . Hypertension   . Arthritis   . Meningioma 04/15/2012    Meds: Prescriptions prior to admission  Medication Sig Dispense Refill  . ibuprofen (ADVIL,MOTRIN) 200 MG tablet Take 600 mg by mouth every 6 (six) hours as needed. For headache      . polyethylene glycol (GOLYTELY) 236 G solution 8 ounces every 15 minutes until you are passing clear liquid  4000 mL  0  . traMADol (ULTRAM-ER) 100 MG 24 hr tablet Take 100 mg by mouth daily.        Marland Kitchen UNKNOWN TO PATIENT BP pill          Allergies: Review of patient's allergies indicates no known allergies.  No family history on file. Past Surgical History  Procedure Date  . Abdominal hysterectomy   . Arm surgery     Review of Systems: Systems reviewed and as per HPI, otherwise negative.  Social history: Previous smoker, used to smoke about a pack and a half of cigarettes a day. Quit "a few years ago. Denies heavy alcohol use. Widowed. Lives with a son, who reportedly has psychiatric and medical problems. Patient is here with 2 daughters-in-law who voice concern about her home living situation. Close status is DO NOT RESUSCITATE.  Family history: Her mom had heart failure and died in her 4s. Her son has a heart problem and psychiatric issues. Her father died of "old age.  Past surgical history hysterectomy  Review of systems: In general no fevers chills weight loss HEENT: Benign brain tumor, chronic headache. Neck no neck stiffness or growths Respiratory as above Cardiovascular as above GI no change in appetite. No nausea vomiting diarrhea. No melena or hematochezia. GU no dysuria or hematuria. M Musculoskeletal: She suffers from chronic back pain and has an injury on her back which she attributes to a heating pad. Hematologic no history of bleeding or blood clots Endocrine no diabetes Skin no rash Neurologic no history of stroke or seizure Psychiatric denies depression hallucinations  Physical Exam: Blood pressure 165/88, pulse 53, temperature 98.2 F (36.8 C), temperature source Oral,  resp. rate 17, height 5\' 1"  (1.549 m), weight 45.8 kg (100 lb 15.5 oz), SpO2 92.00%. BP 165/88  Pulse 53  Temp 98.2 F (36.8 C) (Oral)  Resp 17  Ht 5\' 1"  (1.549 m)  Wt 45.8 kg (100 lb 15.5 oz)  BMI 19.08 kg/m2  SpO2 92%  General Appearance:    Alert, cooperative, no distress, appears stated age  Head:    Normocephalic, without obvious abnormality, atraumatic  Eyes:    PERRL, conjunctiva/corneas clear, EOM's  intact, fundi    benign, both eyes  Ears:    Normal TM's and external ear canals, both ears  Nose:   Nares normal, septum midline, mucosa normal, no drainage    or sinus tenderness  Throat:   Lips, mucosa, and tongue normal; teeth and gums normal  Neck:   Supple, symmetrical, trachea midline, no adenopathy;    thyroid:  no enlargement/tenderness/nodules; no carotid   bruit or JVD  Back:     Symmetric, no curvature, ROM normal, no CVA tenderness  Lungs:     Clear to auscultation bilaterally, respirations unlabored  Chest Wall:    No tenderness or deformity   Heart:    Regular rate and rhythm, S1 and S2 normal, no murmur, rub   or gallop  Breast Exam:    No tenderness, masses, or nipple abnormality  Abdomen:     Soft, non-tender, bowel sounds active all four quadrants,    no masses, no organomegaly  Genitalia:    Normal female without lesion, discharge or tenderness  Rectal:    Normal tone, normal prostate, no masses or tenderness;   guaiac negative stool  Extremities:   Extremities normal, atraumatic, no cyanosis or edema  Pulses:   2+ and symmetric all extremities  Skin:   Skin color, texture, turgor normal, no rashes or lesions  Lymph nodes:   Cervical, supraclavicular, and axillary nodes normal  Neurologic:   CNII-XII intact, normal strength, sensation and reflexes    throughout    Lab results: Basic Metabolic Panel:  Basename 04/15/12 0726  NA 144  K 2.8*  CL 109  CO2 28  GLUCOSE 90  BUN 15  CREATININE 0.79  CALCIUM 10.0  MG --  PHOS --    Basename 04/15/12 0726  WBC 4.6  NEUTROABS 2.8  HGB 7.1*  HCT 23.6*  MCV 75.2*  PLT 285   Cardiac Enzymes:  Basename 04/15/12 0726  CKTOTAL --  CKMB --  CKMBINDEX --  TROPONINI <0.30   BNP:  Basename 04/15/12 0726  PROBNP 5133.0*   Anemia Panel:  Basename 04/15/12 0830  VITAMINB12 --  FOLATE --  FERRITIN --  TIBC --  IRON --  RETICCTPCT 1.2   Urinalysis:  Basename 04/15/12 0758  COLORURINE YELLOW   LABSPEC 1.015  PHURINE 8.0  GLUCOSEU NEGATIVE  HGBUR NEGATIVE  BILIRUBINUR NEGATIVE  KETONESUR NEGATIVE  PROTEINUR NEGATIVE  UROBILINOGEN 0.2  NITRITE POSITIVE*  LEUKOCYTESUR NEGATIVE   Imaging results:  Dg Chest Portable 1 View  04/15/2012  *RADIOLOGY REPORT*  Clinical Data: Shortness of breath  PORTABLE CHEST - 1 VIEW  Comparison: Prior chest x-ray 11/19/2011  Findings: Interval development of a increased pulmonary vascular congestion, cephalization and indistinctness of the interstitial markings suggesting mild interstitial edema.  Marked enlargement of the cardiopericardial silhouette is similar to prior.  Enlarged main central pulmonary arteries.  Ectatic, tortuous and atherosclerotic thoracic aorta.  Developing linear opacities in the bilateral bases.  Background changes of hyperinflation and chronic coarsening of the  interstitial markings persist consistent with underlying COPD/emphysema.  No acute osseous abnormality.  Bones appear osteopenic.  IMPRESSION:  1.  Mild interstitial edema and unchanged cardiomegaly suggest mild CHF. 2.  Background changes of underlying COPD/emphysema persist 3.  Linear opacities the bilateral bases most consistent with subsegmental atelectasis 4.  Ectatic, atherosclerotic and highly tortuous thoracic aorta   Original Report Authenticated By: Sterling Big, M.D.    EKG: Normal sinus rhythm with sinus arrhythmia Minimal voltage criteria for LVH, may be normal variant ST & T wave abnormality, consider inferior ischemia  Assessment & Plan: Active Problems:  CHF (congestive heart failure), new onset: Will continue Lasix, nitroglycerin drip for blood pressure control and vasodilation. Admit to step down because of the nitroglycerin drip. Await echocardiogram. Add ACE inhibitor for accelerated blood pressure. Daily weights. Low salt diet. Oxygen. Check TSH.  Microcytic anemia, with heme-negative stool. Anemia panel is pending. No known history of  endoscopy. Will likely need endoscopy, but possibly outpatient.  Hypokalemia: Replete. Check magnesium level.  Malignant hypertension: See above.  Chest pain: Continue telemetry. Check serial cardiac enzymes.  UTI (urinary tract infection), mild. Rather asymptomatic. Will add Macrobid twice daily.  Chronic back pain: Stop nonsteroidal anti-inflammatories. Tylenol and tramadol is safe  Chronic headache  Meningioma: Last imaging of the brain was in 2009. Questionable home situation: Will consult physical therapy for evaluation. Consult social work.  Calisa Luckenbaugh L 04/15/2012, 3:38 PM

## 2012-04-15 NOTE — ED Notes (Signed)
Patient giving bedpan for urine specimen.

## 2012-04-16 DIAGNOSIS — I10 Essential (primary) hypertension: Secondary | ICD-10-CM

## 2012-04-16 DIAGNOSIS — R404 Transient alteration of awareness: Secondary | ICD-10-CM

## 2012-04-16 DIAGNOSIS — E611 Iron deficiency: Secondary | ICD-10-CM | POA: Diagnosis present

## 2012-04-16 DIAGNOSIS — R1310 Dysphagia, unspecified: Secondary | ICD-10-CM | POA: Diagnosis present

## 2012-04-16 DIAGNOSIS — E538 Deficiency of other specified B group vitamins: Secondary | ICD-10-CM | POA: Diagnosis present

## 2012-04-16 LAB — BASIC METABOLIC PANEL
Calcium: 10.1 mg/dL (ref 8.4–10.5)
Creatinine, Ser: 0.8 mg/dL (ref 0.50–1.10)
GFR calc Af Amer: 75 mL/min — ABNORMAL LOW (ref >90)
GFR calc non Af Amer: 65 mL/min — ABNORMAL LOW (ref >90)
Sodium: 143 mEq/L (ref 135–145)

## 2012-04-16 LAB — CBC
Platelets: 301 10*3/uL (ref 150–400)
RDW: 17.4 % — ABNORMAL HIGH (ref 11.5–15.5)
WBC: 5.7 10*3/uL (ref 4.0–10.5)

## 2012-04-16 MED ORDER — CYANOCOBALAMIN 1000 MCG/ML IJ SOLN
1000.0000 ug | Freq: Every day | INTRAMUSCULAR | Status: DC
  Administered 2012-04-16 – 2012-04-19 (×4): 1000 ug via INTRAMUSCULAR
  Filled 2012-04-16 (×4): qty 1

## 2012-04-16 MED ORDER — LISINOPRIL 10 MG PO TABS
20.0000 mg | ORAL_TABLET | Freq: Every day | ORAL | Status: DC
  Administered 2012-04-16: 20 mg via ORAL
  Filled 2012-04-16: qty 2

## 2012-04-16 MED ORDER — HYDROCODONE-ACETAMINOPHEN 5-325 MG PO TABS
1.0000 | ORAL_TABLET | Freq: Four times a day (QID) | ORAL | Status: DC | PRN
  Administered 2012-04-16 – 2012-04-20 (×6): 1 via ORAL
  Filled 2012-04-16 (×6): qty 1

## 2012-04-16 MED ORDER — POTASSIUM CHLORIDE 20 MEQ/15ML (10%) PO SOLN
ORAL | Status: AC
  Filled 2012-04-16: qty 30

## 2012-04-16 MED ORDER — SODIUM CHLORIDE 0.9 % IJ SOLN
INTRAMUSCULAR | Status: AC
  Administered 2012-04-16: 3 mL via INTRAVENOUS
  Filled 2012-04-16: qty 3

## 2012-04-16 MED ORDER — METOPROLOL TARTRATE 1 MG/ML IV SOLN
5.0000 mg | Freq: Four times a day (QID) | INTRAVENOUS | Status: DC
  Administered 2012-04-16 – 2012-04-18 (×6): 5 mg via INTRAVENOUS
  Filled 2012-04-16 (×7): qty 5

## 2012-04-16 MED ORDER — POTASSIUM CHLORIDE CRYS ER 10 MEQ PO TBCR: 10.0000 meq | EXTENDED_RELEASE_TABLET | Freq: Every day | ORAL | Status: DC

## 2012-04-16 MED ORDER — LORAZEPAM 2 MG/ML IJ SOLN
1.0000 mg | INTRAMUSCULAR | Status: DC | PRN
  Administered 2012-04-16 – 2012-04-18 (×7): 1 mg via INTRAVENOUS
  Administered 2012-04-19: via INTRAVENOUS
  Administered 2012-04-19 – 2012-04-20 (×3): 1 mg via INTRAVENOUS
  Filled 2012-04-16 (×11): qty 1

## 2012-04-16 MED ORDER — FUROSEMIDE 40 MG PO TABS: 40.0000 mg | ORAL_TABLET | Freq: Every day | ORAL | Status: DC

## 2012-04-16 MED ORDER — SODIUM CHLORIDE 0.9 % IJ SOLN
INTRAMUSCULAR | Status: AC
  Filled 2012-04-16: qty 9

## 2012-04-16 MED ORDER — POTASSIUM CHLORIDE CRYS ER 10 MEQ PO TBCR
10.0000 meq | EXTENDED_RELEASE_TABLET | Freq: Every day | ORAL | Status: DC
  Administered 2012-04-16: 10 meq via ORAL
  Filled 2012-04-16: qty 1

## 2012-04-16 MED ORDER — HALOPERIDOL LACTATE 5 MG/ML IJ SOLN
2.5000 mg | Freq: Once | INTRAMUSCULAR | Status: AC
  Administered 2012-04-16: 2.5 mg via INTRAVENOUS
  Filled 2012-04-16: qty 1

## 2012-04-16 NOTE — Progress Notes (Signed)
pt noted to have increased confusion and agitation. Pt hallucinating  about sons being in room. Attempt to reorient caused increased agitation. Pt pulled off scds, and monitor wires. Notified md of change  And of increased bp. New orders received.

## 2012-04-16 NOTE — Progress Notes (Signed)
Overnight, became confused, agitated and combative.  Haldol has not helped much. Son reported to RN that patient was on hydrocodone, not tramadol. No known benzodiazepines, and patient denied alcohol use yesterday while lucid. Nurse reported dysphagia and difficulty swallowing pills yesterday, so patient was to dysphagia diet with thickened liquids, crushing pills.  Subjective: Upset that she has safety mittens on. No headache. No shortness of breath. No chest pain. Some back pain.  Objective: Vital signs in last 24 hours: Filed Vitals:   04/16/12 0419 04/16/12 0500 04/16/12 0600 04/16/12 0750  BP:  184/92    Pulse:  76 79   Temp: 97.6 F (36.4 C)   98 F (36.7 C)  TempSrc: Oral   Oral  Resp:  22 20   Height:      Weight: 50 kg (110 lb 3.7 oz)     SpO2:  93% 92%    Weight change: 2.708 kg (5 lb 15.5 oz)  Intake/Output Summary (Last 24 hours) at 04/16/12 0757 Last data filed at 04/16/12 0710  Gross per 24 hour  Intake  764.5 ml  Output   5228 ml  Net -4463.5 ml   Telemetry, normal sinus rhythm  General: Patient is crouched at the end of the bed, lying sideways. Alert. Disoriented. Answers questions and follows a few commands Lungs: Clear to auscultation bilaterally without wheeze rhonchi or rales Cardiovascular regular rate rhythm without murmurs gallops rubs Abdomen soft nontender nondistended Extremities less edema.  Lab Results: Basic Metabolic Panel:  Lab 04/16/12 1610 04/15/12 1039 04/15/12 0726  NA 143 -- 144  K 4.3 -- 2.8*  CL 109 -- 109  CO2 26 -- 28  GLUCOSE 85 -- 90  BUN 14 -- 15  CREATININE 0.80 -- 0.79  CALCIUM 10.1 -- 10.0  MG -- 2.2 --  PHOS -- -- --   Liver Function Tests:  Lab 04/15/12 1039  AST 20  ALT 10  ALKPHOS 56  BILITOT 0.4  PROT 5.7*  ALBUMIN 2.9*   No results found for this basename: LIPASE:2,AMYLASE:2 in the last 168 hours No results found for this basename: AMMONIA:2 in the last 168 hours CBC:  Lab 04/16/12 0451 04/15/12 0726   WBC 5.7 4.6  NEUTROABS -- 2.8  HGB 7.6* 7.1*  HCT 26.2* 23.6*  MCV 76.4* 75.2*  PLT 301 285   Cardiac Enzymes:  Lab 04/15/12 1736 04/15/12 1231 04/15/12 0726  CKTOTAL -- -- --  CKMB -- -- --  CKMBINDEX -- -- --  TROPONINI <0.30 <0.30 <0.30   BNP:  Lab 04/15/12 0726  PROBNP 5133.0*   D-Dimer: No results found for this basename: DDIMER:2 in the last 168 hours CBG: No results found for this basename: GLUCAP:6 in the last 168 hours Hemoglobin A1C: No results found for this basename: HGBA1C in the last 168 hours Fasting Lipid Panel: No results found for this basename: CHOL,HDL,LDLCALC,TRIG,CHOLHDL,LDLDIRECT in the last 960 hours Thyroid Function Tests:  Lab 04/15/12 1039  TSH 1.805  T4TOTAL --  FREET4 --  T3FREE --  THYROIDAB --   Coagulation: No results found for this basename: LABPROT:4,INR:4 in the last 168 hours Anemia Panel:  Lab 04/15/12 0830  VITAMINB12 163*  FOLATE >20.0  FERRITIN 6*  TIBC 374  IRON 15*  RETICCTPCT 1.2   Urinalysis:  Lab 04/15/12 0758  COLORURINE YELLOW  LABSPEC 1.015  PHURINE 8.0  GLUCOSEU NEGATIVE  HGBUR NEGATIVE  BILIRUBINUR NEGATIVE  KETONESUR NEGATIVE  PROTEINUR NEGATIVE  UROBILINOGEN 0.2  NITRITE POSITIVE*  LEUKOCYTESUR NEGATIVE  Micro Results: Recent Results (from the past 240 hour(s))  MRSA PCR SCREENING     Status: Normal   Collection Time   04/15/12 12:53 PM      Component Value Range Status Comment   MRSA by PCR NEGATIVE  NEGATIVE Final    Studies/Results: US Venous Img Lower Bilateral  04/15/2012  *RADIOLOGY REPORT*  Clinical Data: Bilateral leg pain with edema.  VENOUS DUPLEX ULTRASOUND OF BILATERAL LOWER EXTREMITIES  Technique:  Gray-scale sonography with graded compression, as well as color Doppler and duplex ultrasound, were performed to evaluate the deep venous system of both lower extremities from the level of the common femoral vein through the popliteal and proximal calf veins.  Spectral Doppler was  utilized to evaluate flow at rest and with distal augmentation maneuvers.  Comparison:  None.  Findings: From the level of the common femoral vein to the popliteal vein, there is adequate color flow, compression and augmentation without evidence of a lower extremity deep venous thrombosis on either side.  Limited imaging of calf veins and superficial veins without thrombus.  Bilateral lower extremity edema.  IMPRESSION: No evidence of lower extremity deep venous thrombosis.  This is a call report.   Original Report Authenticated By: Fuller Canada, M.D.    Dg Chest Portable 1 View  04/15/2012  *RADIOLOGY REPORT*  Clinical Data: Shortness of breath  PORTABLE CHEST - 1 VIEW  Comparison: Prior chest x-ray 11/19/2011  Findings: Interval development of a increased pulmonary vascular congestion, cephalization and indistinctness of the interstitial markings suggesting mild interstitial edema.  Marked enlargement of the cardiopericardial silhouette is similar to prior.  Enlarged main central pulmonary arteries.  Ectatic, tortuous and atherosclerotic thoracic aorta.  Developing linear opacities in the bilateral bases.  Background changes of hyperinflation and chronic coarsening of the interstitial markings persist consistent with underlying COPD/emphysema.  No acute osseous abnormality.  Bones appear osteopenic.  IMPRESSION:  1.  Mild interstitial edema and unchanged cardiomegaly suggest mild CHF. 2.  Background changes of underlying COPD/emphysema persist 3.  Linear opacities the bilateral bases most consistent with subsegmental atelectasis 4.  Ectatic, atherosclerotic and highly tortuous thoracic aorta   Original Report Authenticated By: Sterling Big, M.D.    Echocardiogram result pending.  Scheduled Meds:   . aspirin EC  81 mg Oral Daily  . cefTRIAXone (ROCEPHIN) IVPB 1 gram/50 mL D5W  1 g Intravenous Once  . docusate sodium  100 mg Oral BID  . furosemide  40 mg Intravenous BID  . haloperidol lactate   2.5 mg Intravenous Once  . influenza  inactive virus vaccine  0.25 mL Intramuscular Once  . lisinopril  20 mg Oral Daily  . nitrofurantoin (macrocrystal-monohydrate)  100 mg Oral Q12H  . nitroGLYCERIN  5-200 mcg/min Intravenous Once  . pneumococcal 23 valent vaccine  0.5 mL Intramuscular Once  . potassium chloride  10 mEq Intravenous Once  . potassium chloride SA  40 mEq Oral Once  . potassium chloride  40 mEq Oral TID  . sodium chloride  3 mL Intravenous Q12H  . sodium chloride      . sodium chloride      . traMADol  50 mg Oral Once  . DISCONTD: lisinopril  10 mg Oral Daily   Continuous Infusions:   . nitroGLYCERIN 5 mcg/min (04/16/12 0600)   PRN Meds:.acetaminophen, acetaminophen, albuterol, alum & mag hydroxide-simeth, magnesium hydroxide, ondansetron (ZOFRAN) IV, ondansetron, traMADol Assessment/Plan: Active Problems:  CHF (congestive heart failure): Has diuresed over 4 L. Await  echocardiogram report. Will change Lasix to once daily. Lung sounds are clear. Edema has improved.  Microcytic anemia: Patient is iron deficient and B12 deficient. Will eventually need workup of the iron deficiency anemia. Patient is too confused at this time. Could even be done as an outpatient as she is not actively bleeding. Will start iron and B12 supplementation.  Hypokalemia resolved  Malignant hypertension not optimally controlled. May somewhat be related to agitation. Will increase lisinopril. We nitroglycerin drip as able.  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 Haldol has not helped much. Will try IV Ativan.  Chest pain, MI ruled out  UTI (urinary tract infection) continue antibiotics  Iron deficiency  B12 deficiency  Chronic back pain  Chronic headache  Meningioma needs repeat imaging electively. Too agitated at this time to cooperate. Dysphagia: Diet changed. Aspiration precautions. Speech consult therapy pending.   LOS: 1 day   Hoyle Barkdull L 04/16/2012, 7:57 AM

## 2012-04-17 ENCOUNTER — Inpatient Hospital Stay (HOSPITAL_COMMUNITY): Payer: Medicare Other

## 2012-04-17 DIAGNOSIS — R1310 Dysphagia, unspecified: Secondary | ICD-10-CM

## 2012-04-17 DIAGNOSIS — D320 Benign neoplasm of cerebral meninges: Secondary | ICD-10-CM

## 2012-04-17 LAB — BASIC METABOLIC PANEL
Chloride: 101 mEq/L (ref 96–112)
Creatinine, Ser: 0.75 mg/dL (ref 0.50–1.10)
GFR calc Af Amer: 86 mL/min — ABNORMAL LOW (ref >90)
Potassium: 3 mEq/L — ABNORMAL LOW (ref 3.5–5.1)
Sodium: 137 mEq/L (ref 135–145)

## 2012-04-17 LAB — HEMOGLOBIN AND HEMATOCRIT, BLOOD
HCT: 26.2 % — ABNORMAL LOW (ref 36.0–46.0)
Hemoglobin: 7.7 g/dL — ABNORMAL LOW (ref 12.0–15.0)

## 2012-04-17 MED ORDER — HYDRALAZINE HCL 20 MG/ML IJ SOLN
10.0000 mg | Freq: Four times a day (QID) | INTRAMUSCULAR | Status: DC | PRN
  Administered 2012-04-18: 10 mg via INTRAVENOUS
  Filled 2012-04-17: qty 1

## 2012-04-17 MED ORDER — FUROSEMIDE 10 MG/ML IJ SOLN
40.0000 mg | Freq: Every day | INTRAMUSCULAR | Status: DC
  Administered 2012-04-17: 40 mg via INTRAVENOUS
  Filled 2012-04-17: qty 4

## 2012-04-17 MED ORDER — SODIUM CHLORIDE 0.9 % IJ SOLN
INTRAMUSCULAR | Status: AC
  Filled 2012-04-17: qty 3

## 2012-04-17 MED ORDER — POTASSIUM CHLORIDE 10 MEQ/100ML IV SOLN
10.0000 meq | INTRAVENOUS | Status: AC
  Administered 2012-04-17 (×4): 10 meq via INTRAVENOUS
  Filled 2012-04-17 (×4): qty 100

## 2012-04-17 MED ORDER — ENALAPRILAT 1.25 MG/ML IV SOLN
0.6250 mg | Freq: Four times a day (QID) | INTRAVENOUS | Status: DC
  Administered 2012-04-17 – 2012-04-19 (×5): 0.625 mg via INTRAVENOUS
  Filled 2012-04-17 (×5): qty 2

## 2012-04-17 MED ORDER — PANTOPRAZOLE SODIUM 40 MG IV SOLR
40.0000 mg | INTRAVENOUS | Status: DC
  Administered 2012-04-17 – 2012-04-18 (×2): 40 mg via INTRAVENOUS
  Filled 2012-04-17 (×2): qty 40

## 2012-04-17 MED ORDER — ENALAPRILAT 1.25 MG/ML IV SOLN
1.2500 mg | Freq: Four times a day (QID) | INTRAVENOUS | Status: DC
  Administered 2012-04-17 (×2): 1.25 mg via INTRAVENOUS
  Filled 2012-04-17 (×3): qty 2

## 2012-04-17 MED ORDER — DEXTROSE 5 % IV SOLN
1.0000 g | INTRAVENOUS | Status: DC
  Administered 2012-04-17 – 2012-04-19 (×3): 1 g via INTRAVENOUS
  Filled 2012-04-17 (×4): qty 10

## 2012-04-17 NOTE — Progress Notes (Signed)
Remained agitated overnight. Not taking pills reliably. Required sedation. Discussed with patient's son, [name]. He is able to provide more history.  Subjective: Asleep. Son reports that she does not drink alcohol or take benzodiazepines. He reports that she gets confused at home occasionally, worse at night. He also reports that she has complained in the past that she has difficulty eating, with the sensation of food "getting stuck". Confirms that she does not take tramadol. Just hydrocodone.  Objective: Vital signs in last 24 hours: Filed Vitals:   04/17/12 0600 04/17/12 0700 04/17/12 0800 04/17/12 0804  BP: 176/91 174/96 133/72   Pulse: 83 79 74   Temp:    99.3 F (37.4 C)  TempSrc:    Axillary  Resp: 20 26 23    Height:      Weight:      SpO2: 93% 94% 95%    Weight change: 0.1 kg (3.5 oz)  Intake/Output Summary (Last 24 hours) at 04/17/12 0853 Last data filed at 04/17/12 0800  Gross per 24 hour  Intake  420.5 ml  Output   3850 ml  Net -3429.5 ml   Telemetry, normal sinus rhythm with pacs  General: asleep. arousable Lungs: Clear to auscultation bilaterally without wheeze rhonchi or rales Cardiovascular regular rate rhythm without murmurs gallops rubs Abdomen soft nontender nondistended Extremities less edema.  Lab Results: Basic Metabolic Panel:  Lab 04/17/12 1610 04/16/12 0451 04/15/12 1039  NA 137 143 --  K 3.0* 4.3 --  CL 101 109 --  CO2 26 26 --  GLUCOSE 161* 85 --  BUN 11 14 --  CREATININE 0.75 0.80 --  CALCIUM 10.5 10.1 --  MG -- -- 2.2  PHOS -- -- --   Liver Function Tests:  Lab 04/15/12 1039  AST 20  ALT 10  ALKPHOS 56  BILITOT 0.4  PROT 5.7*  ALBUMIN 2.9*   No results found for this basename: LIPASE:2,AMYLASE:2 in the last 168 hours No results found for this basename: AMMONIA:2 in the last 168 hours CBC:  Lab 04/17/12 0455 04/16/12 0451 04/15/12 0726  WBC -- 5.7 4.6  NEUTROABS -- -- 2.8  HGB 7.7* 7.6* --  HCT 26.2* 26.2* --  MCV -- 76.4*  75.2*  PLT -- 301 285   Cardiac Enzymes:  Lab 04/15/12 1736 04/15/12 1231 04/15/12 0726  CKTOTAL -- -- --  CKMB -- -- --  CKMBINDEX -- -- --  TROPONINI <0.30 <0.30 <0.30   BNP:  Lab 04/15/12 0726  PROBNP 5133.0*   D-Dimer: No results found for this basename: DDIMER:2 in the last 168 hours CBG: No results found for this basename: GLUCAP:6 in the last 168 hours Hemoglobin A1C: No results found for this basename: HGBA1C in the last 168 hours Fasting Lipid Panel: No results found for this basename: CHOL,HDL,LDLCALC,TRIG,CHOLHDL,LDLDIRECT in the last 960 hours Thyroid Function Tests:  Lab 04/15/12 1039  TSH 1.805  T4TOTAL --  FREET4 --  T3FREE --  THYROIDAB --   Coagulation: No results found for this basename: LABPROT:4,INR:4 in the last 168 hours Anemia Panel:  Lab 04/15/12 0830  VITAMINB12 163*  FOLATE >20.0  FERRITIN 6*  TIBC 374  IRON 15*  RETICCTPCT 1.2   Urinalysis:  Lab 04/15/12 0758  COLORURINE YELLOW  LABSPEC 1.015  PHURINE 8.0  GLUCOSEU NEGATIVE  HGBUR NEGATIVE  BILIRUBINUR NEGATIVE  KETONESUR NEGATIVE  PROTEINUR NEGATIVE  UROBILINOGEN 0.2  NITRITE POSITIVE*  LEUKOCYTESUR NEGATIVE   Micro Results: Recent Results (from the past 240 hour(s))  MRSA PCR  SCREENING     Status: Normal   Collection Time   04/15/12 12:53 PM      Component Value Range Status Comment   MRSA by PCR NEGATIVE  NEGATIVE Final    Studies/Results: US Venous Img Lower Bilateral  04/15/2012  *RADIOLOGY REPORT*  Clinical Data: Bilateral leg pain with edema.  VENOUS DUPLEX ULTRASOUND OF BILATERAL LOWER EXTREMITIES  Technique:  Gray-scale sonography with graded compression, as well as color Doppler and duplex ultrasound, were performed to evaluate the deep venous system of both lower extremities from the level of the common femoral vein through the popliteal and proximal calf veins.  Spectral Doppler was utilized to evaluate flow at rest and with distal augmentation maneuvers.   Comparison:  None.  Findings: From the level of the common femoral vein to the popliteal vein, there is adequate color flow, compression and augmentation without evidence of a lower extremity deep venous thrombosis on either side.  Limited imaging of calf veins and superficial veins without thrombus.  Bilateral lower extremity edema.  IMPRESSION: No evidence of lower extremity deep venous thrombosis.  This is a call report.   Original Report Authenticated By: Fuller Canada, M.D.    Echocardiogram result pending.  Scheduled Meds:    . cefTRIAXone (ROCEPHIN)  IV  1 g Intravenous Q24H  . cyanocobalamin  1,000 mcg Intramuscular Daily  . enalaprilat  1.25 mg Intravenous Q6H  . furosemide  40 mg Intravenous Daily  . metoprolol  5 mg Intravenous Q6H  . pantoprazole (PROTONIX) IV  40 mg Intravenous Q24H  . potassium chloride  10 mEq Intravenous Q1 Hr x 4  . sodium chloride  3 mL Intravenous Q12H  . sodium chloride      . DISCONTD: aspirin EC  81 mg Oral Daily  . DISCONTD: docusate sodium  100 mg Oral BID  . DISCONTD: furosemide  40 mg Oral Daily  . DISCONTD: lisinopril  20 mg Oral Daily  . DISCONTD: nitrofurantoin (macrocrystal-monohydrate)  100 mg Oral Q12H  . DISCONTD: potassium chloride  10 mEq Oral Daily  . DISCONTD: potassium chloride  10 mEq Oral Daily   Continuous Infusions:    . nitroGLYCERIN 66.667 mcg/min (04/17/12 0800)   PRN Meds:.acetaminophen, acetaminophen, albuterol, alum & mag hydroxide-simeth, HYDROcodone-acetaminophen, LORazepam, magnesium hydroxide, ondansetron (ZOFRAN) IV, ondansetron Assessment/Plan: Active Problems:  CHF (congestive heart failure), echocardiogram pending. Continue Lasix. Patient is diuresising well.   Microcytic anemia, stable without evidence of bleeding currently.   Hypokalemia: Replete IV, as patient is not taking medications.   Malignant hypertension: Patient was started on IV metoprolol last night. Will change ACE inhibitor to IV, do to  delirium and agitation, not taking medications by mouth. Try to wean off nitroglycerin drip. Will give when necessary IV hydralazine.   Acute delirium likely a combination of sundowner syndrome and toxic encephalopathy. Also however, patient has not had any imaging of her brain since 2009. She had a large meningioma in the right frontal area which may be contributing. Will get a CT of the brain today as patient is less agitated.   Chest pain, MI ruled out. Await echocardiogram.   UTI (urinary tract infection): Cultures pending. Switch back to IV antibiotics, she's not taking pills reliably.   Iron deficiency, with heme-negative stool. Given patient's history of oral versus esophageal dysphagia and continued delirium, oral repletion may not be feasible. Will order IV iron for now. Will need eventual upper and lower endoscopy. See below. empiric protoniX. Patient has no known history  of endoscopy.   B12 deficiency, receiving injections   Dysphagia: Nurse reported oral pharyngeal dysphasia symptoms. However, patient's son is reporting also symptoms concerning for esophageal stricture or other. Therefore, inpatient GI evaluation is warranted, once delirium and medical issues stabilized a bit.   Chronic back pain   Chronic headache   Meningioma: The patient's son reports that patient was evaluated by neurology 2009, who did not recommend resection. See above.  Disposition: According to patient's son, [name], patient cares for her mentally ill son, [name]. He has bipolar disorder and is unable to care for himself, or his mother. [name] reports that he is unable to stay with his mother. Social work will be reconsulted. It is not safe for patient to go back home prior to transfer to a facility, unless other family members are available to provide 24 hour supervision.    LOS: 2 days   Gloris Shiroma L 04/17/2012, 8:53 AM

## 2012-04-18 ENCOUNTER — Inpatient Hospital Stay (HOSPITAL_COMMUNITY)
Admit: 2012-04-18 | Discharge: 2012-04-18 | Disposition: A | Discharge status: Home / Self Care | Payer: Medicare Other | Attending: Neurology | Admitting: Neurology

## 2012-04-18 ENCOUNTER — Encounter (HOSPITAL_COMMUNITY): Payer: Self-pay | Admitting: Urgent Care

## 2012-04-18 ENCOUNTER — Encounter (HOSPITAL_COMMUNITY)
Admission: EM | Disposition: A | Discharge status: Skilled Nursing Facility | Payer: Self-pay | Source: Home / Self Care | Attending: Internal Medicine

## 2012-04-18 DIAGNOSIS — K2970 Gastritis, unspecified, without bleeding: Secondary | ICD-10-CM

## 2012-04-18 DIAGNOSIS — K2990 Gastroduodenitis, unspecified, without bleeding: Secondary | ICD-10-CM

## 2012-04-18 DIAGNOSIS — D509 Iron deficiency anemia, unspecified: Secondary | ICD-10-CM

## 2012-04-18 DIAGNOSIS — N390 Urinary tract infection, site not specified: Secondary | ICD-10-CM

## 2012-04-18 DIAGNOSIS — R1310 Dysphagia, unspecified: Secondary | ICD-10-CM

## 2012-04-18 HISTORY — PX: ESOPHAGOGASTRODUODENOSCOPY: SHX1529

## 2012-04-18 LAB — BASIC METABOLIC PANEL
BUN: 17 mg/dL (ref 6–23)
Chloride: 105 mEq/L (ref 96–112)
Creatinine, Ser: 1 mg/dL (ref 0.50–1.10)
GFR calc non Af Amer: 50 mL/min — ABNORMAL LOW (ref >90)
Glucose, Bld: 108 mg/dL — ABNORMAL HIGH (ref 70–99)
Potassium: 3.3 mEq/L — ABNORMAL LOW (ref 3.5–5.1)

## 2012-04-18 SURGERY — ESOPHAGOGASTRODUODENOSCOPY (EGD) WITH ESOPHAGEAL DILATION
Anesthesia: Moderate Sedation

## 2012-04-18 MED ORDER — SODIUM CHLORIDE 0.9 % IV SOLN
500.0000 mg | INTRAVENOUS | Status: DC
  Administered 2012-04-18: 500 mg via INTRAVENOUS
  Filled 2012-04-18 (×2): qty 4

## 2012-04-18 MED ORDER — FUROSEMIDE 20 MG PO TABS
20.0000 mg | ORAL_TABLET | Freq: Every day | ORAL | Status: DC
  Administered 2012-04-18 – 2012-04-19 (×2): 20 mg via ORAL
  Filled 2012-04-18 (×2): qty 1

## 2012-04-18 MED ORDER — MEPERIDINE HCL 100 MG/ML IJ SOLN
INTRAMUSCULAR | Status: DC | PRN
  Administered 2012-04-18: 25 mg via INTRAVENOUS

## 2012-04-18 MED ORDER — PANTOPRAZOLE SODIUM 40 MG PO TBEC
40.0000 mg | DELAYED_RELEASE_TABLET | Freq: Every day | ORAL | Status: DC
  Filled 2012-04-18: qty 1

## 2012-04-18 MED ORDER — SODIUM CHLORIDE 0.9 % IJ SOLN
INTRAMUSCULAR | Status: AC
  Administered 2012-04-18: 10 mL
  Filled 2012-04-18: qty 3

## 2012-04-18 MED ORDER — SODIUM CHLORIDE 0.9 % IV SOLN: INTRAVENOUS | Status: DC

## 2012-04-18 MED ORDER — MEPERIDINE HCL 100 MG/ML IJ SOLN
INTRAMUSCULAR | Status: AC
  Filled 2012-04-18: qty 1

## 2012-04-18 MED ORDER — MINERAL OIL PO OIL
TOPICAL_OIL | ORAL | Status: AC
  Filled 2012-04-18: qty 30

## 2012-04-18 MED ORDER — STERILE WATER FOR IRRIGATION IR SOLN
Status: DC | PRN
  Administered 2012-04-18: 17:00:00

## 2012-04-18 MED ORDER — MIDAZOLAM HCL 5 MG/5ML IJ SOLN
INTRAMUSCULAR | Status: DC | PRN
  Administered 2012-04-18: 1 mg via INTRAVENOUS

## 2012-04-18 MED ORDER — MIDAZOLAM HCL 5 MG/5ML IJ SOLN
INTRAMUSCULAR | Status: AC
  Filled 2012-04-18: qty 5

## 2012-04-18 MED ORDER — METOPROLOL TARTRATE 1 MG/ML IV SOLN
5.0000 mg | Freq: Three times a day (TID) | INTRAVENOUS | Status: DC
  Administered 2012-04-18 – 2012-04-19 (×3): 5 mg via INTRAVENOUS
  Filled 2012-04-18 (×3): qty 5

## 2012-04-18 NOTE — Progress Notes (Signed)
INITIAL ADULT NUTRITION ASSESSMENT Date: 04/18/2012   Time: 1:17 PM Reason for Assessment: Low Braden  ASSESSMENT: Female 78 y.o.  C/o :Chest Pain and dx includes CHF, Microcytic Anemia  : Past Medical History  Diagnosis Date  . Back pain, chronic   . Headache   . Hypertension   . Arthritis   . Meningioma 04/15/2012  . IDA (iron deficiency anemia)    Scheduled Meds:   . cefTRIAXone (ROCEPHIN)  IV  1 g Intravenous Q24H  . cyanocobalamin  1,000 mcg Intramuscular Daily  . enalaprilat  0.625 mg Intravenous Q6H  . furosemide  20 mg Oral Daily  . methylPREDNISolone (SOLU-MEDROL) injection  500 mg Intravenous Q24H  . metoprolol  5 mg Intravenous Q8H  . pantoprazole (PROTONIX) IV  40 mg Intravenous Q24H  . potassium chloride  10 mEq Intravenous Q1 Hr x 4  . sodium chloride  3 mL Intravenous Q12H  . sodium chloride      . sodium chloride      . sodium chloride      . DISCONTD: enalaprilat  1.25 mg Intravenous Q6H  . DISCONTD: furosemide  40 mg Intravenous Daily  . DISCONTD: metoprolol  5 mg Intravenous Q6H   Continuous Infusions:  PRN Meds:.acetaminophen, acetaminophen, albuterol, alum & mag hydroxide-simeth, hydrALAZINE, HYDROcodone-acetaminophen, LORazepam, magnesium hydroxide, ondansetron (ZOFRAN) IV, ondansetron  Ht: 5\' 1"  (154.9 cm)  Wt: 100 lb 15.5 oz (45.8 kg) (daily wt monitoring)   Ideal Wt: 47.8 kg  % Ideal Wt: 96%  Usual Wt:  Wt Readings from Last 10 Encounters:  04/18/12 100 lb 15.5 oz (45.8 kg)  04/18/12 100 lb 15.5 oz (45.8 kg)  01/16/11 102 lb (46.267 kg)     Body mass index is 19.08 kg/(m^2). Normal Range  Food/Nutrition Related Hx: hx of very poor appetite and po intake. Daughter-in-law provided hx. Reports significant wt loss but unable to quantify at this time. Daily wt monitoring. Very poor appetite and po intake 0-20%, pt always been a small person and not a "big eater". Currently she is NPO for EGD. ST eval pending. Hx of CHF. Unclear if  increased fluid may be masking wt loss. Her Braden Score is=12 which places her high risk for skin breakdown. Based on diet hx review pt is chronically undernourished. Unable to dx malnutrition at this time. Will cont to follow and re-evaluate.   CMP     Component Value Date/Time   NA 141 04/18/2012 0510   K 3.3* 04/18/2012 0510   CL 105 04/18/2012 0510   CO2 23 04/18/2012 0510   GLUCOSE 108* 04/18/2012 0510   BUN 17 04/18/2012 0510   CREATININE 1.00 04/18/2012 0510   CALCIUM 11.2* 04/18/2012 0510   PROT 5.7* 04/15/2012 1039   ALBUMIN 2.9* 04/15/2012 1039   AST 20 04/15/2012 1039   ALT 10 04/15/2012 1039   ALKPHOS 56 04/15/2012 1039   BILITOT 0.4 04/15/2012 1039   GFRNONAA 50* 04/18/2012 0510   GFRAA 57* 04/18/2012 0510    Intake/Output Summary (Last 24 hours) at 04/18/12 1320 Last data filed at 04/18/12 0946  Gross per 24 hour  Intake    503 ml  Output   2550 ml  Net  -2047 ml   *output noted  Diet Order: NPO  Supplements/Tube Feeding:  IVF:    Estimated Nutritional Needs:   Kcal:1380-1610 kcal Protein:55-65 gr Fluid:1200 ml daily   NUTRITION DIAGNOSIS: -Inadequate oral intake (NI-2.1).  Status: Ongoing  RELATED TO: swallow difficulty, decreased appetite  AS EVIDENCE  BY: pt hx early satiety, coughing with solid foods at times  MONITORING/EVALUATION(Goals): -Monitor po intake, diet advancement, wt changes, labs, results of EGD/swallow eval. -Goal: Pt to meet >/= 90% of their estimated nutrition needs; not met  EDUCATION NEEDS: -No education needs identified at this time  INTERVENTION: -Add appropriate supplement when diet advanced -RD will follow for nutrition needs  Dietitian [phone]  DOCUMENTATION CODES Per approved criteria  -Not Applicable    Francene Boyers 04/18/2012, 1:17 PM

## 2012-04-18 NOTE — Progress Notes (Signed)
Dr. Darrick Penna notified of increased HR.  No new orders received.  Will cont to monitor.

## 2012-04-18 NOTE — Consult Note (Signed)
Referring Provider:Dr Lilly Cove Primary Care Physician:  No primary provider on file. Primary Gastroenterologist:  Dr. Jonette Eva  Reason for Consultation:  IDA, dysphagia  HPI: Madison Hickman is a 78 y.o. female admitted with chest pain and CHF.  She is found to have a microcytic anemia. Hemoglobin 7.13 days ago, currently 9.3. Hemoglobin was 13 approx 1 year ago. Her iron 15 and ferritin is 6. She is also found to have B12 deficiency. She has long-standing history of dysphagia. She has problems mostly swallowing solid foods with episodes of "choking". She barely eats or drinks per her daughter-in-law who is at bedside. She has progressively lost a significant amount of weight. They have encouraged her to go to the doctor, however she rarely seeks medical care. She has had some heartburn and indigestion and has not been on PPI. It is clear that she has been taking chronic NSAIDs including Advil and others however daughter in law not sure about exactly what she has been taking.  She has an enlarging meningioma with subsequent edema shift. Neurology has been consulted.  She has chronic constipation. She denies any rectal bleeding or melena. Very poor appetite and early satiety. Her daughter-in-law states she "eats like a bird" and always has. She is easily nauseated and vomits easily, however gives no history of recent persistent emesis. She has never had colonoscopy or EGD.  She c/o left shoulder pain, weakness, fatigue & malaise.  She has had chest pain, but denies any right now.  Past Medical History  Diagnosis Date  . Back pain, chronic   . Headache   . Hypertension   . Arthritis   . Meningioma 04/15/2012  . IDA (iron deficiency anemia)     Past Surgical History  Procedure Date  . Abdominal hysterectomy   . Arm surgery     Prior to Admission medications   Medication Sig Start Date End Date Taking? Authorizing Provider  ibuprofen (ADVIL,MOTRIN) 200 MG tablet Take 600 mg by mouth  every 6 (six) hours as needed. For headache   Yes Historical Provider, MD  polyethylene glycol (GOLYTELY) 236 G solution 8 ounces every 15 minutes until you are passing clear liquid 02/14/12  Yes [name], MD  traMADol (ULTRAM-ER) 100 MG 24 hr tablet Take 100 mg by mouth daily.     Yes Historical Provider, MD  UNKNOWN TO PATIENT BP pill    Yes Historical Provider, MD    Current Facility-Administered Medications  Medication Dose Route Frequency Provider Last Rate Last Dose  . acetaminophen (TYLENOL) tablet 650 mg  650 mg Oral Q6H PRN Christiane Ha, MD   650 mg at 04/15/12 1933   Or  . acetaminophen (TYLENOL) suppository 650 mg  650 mg Rectal Q6H PRN Christiane Ha, MD      . albuterol (PROVENTIL) (5 MG/ML) 0.5% nebulizer solution 2.5 mg  2.5 mg Nebulization Q2H PRN Christiane Ha, MD      . alum & mag hydroxide-simeth (MAALOX/MYLANTA) 200-200-20 MG/5ML suspension 30 mL  30 mL Oral Q6H PRN Christiane Ha, MD   30 mL at 04/16/12 0900  . cefTRIAXone (ROCEPHIN) 1 g in dextrose 5 % 50 mL IVPB  1 g Intravenous Q24H Christiane Ha, MD   1 g at 04/17/12 1132  . cyanocobalamin ((VITAMIN B-12)) injection 1,000 mcg  1,000 mcg Intramuscular Daily Christiane Ha, MD   1,000 mcg at 04/17/12 1106  . enalaprilat (VASOTEC) injection 0.625 mg  0.625 mg Intravenous Q6H Corinna  Burman Freestone, MD   0.625 mg at 04/18/12 0520  . furosemide (LASIX) tablet 20 mg  20 mg Oral Daily Nimish C Gosrani, MD      . hydrALAZINE (APRESOLINE) injection 10 mg  10 mg Intravenous Q6H PRN Christiane Ha, MD   10 mg at 04/18/12 0013  . HYDROcodone-acetaminophen (NORCO/VICODIN) 5-325 MG per tablet 1 tablet  1 tablet Oral Q6H PRN Christiane Ha, MD   1 tablet at 04/18/12 0231  . LORazepam (ATIVAN) injection 1 mg  1 mg Intravenous Q4H PRN Christiane Ha, MD   1 mg at 04/17/12 2033  . magnesium hydroxide (MILK OF MAGNESIA) suspension 30 mL  30 mL Oral Daily PRN Christiane Ha, MD   30 mL at 04/15/12  1928  . methylPREDNISolone sodium succinate (SOLU-MEDROL) 500 mg in sodium chloride 0.9 % 50 mL IVPB  500 mg Intravenous Q24H Beryle Beams, MD      . metoprolol (LOPRESSOR) injection 5 mg  5 mg Intravenous Q8H Nimish C Gosrani, MD      . ondansetron (ZOFRAN) tablet 4 mg  4 mg Oral Q6H PRN Christiane Ha, MD       Or  . ondansetron (ZOFRAN) injection 4 mg  4 mg Intravenous Q6H PRN Christiane Ha, MD   4 mg at 04/15/12 2130  . pantoprazole (PROTONIX) injection 40 mg  40 mg Intravenous Q24H Christiane Ha, MD   40 mg at 04/17/12 4540  . potassium chloride 10 mEq in 100 mL IVPB  10 mEq Intravenous Q1 Hr x 4 Christiane Ha, MD   10 mEq at 04/17/12 1324  . sodium chloride 0.9 % injection 3 mL  3 mL Intravenous Q12H Christiane Ha, MD   3 mL at 04/16/12 0846  . sodium chloride 0.9 % injection           . sodium chloride 0.9 % injection           . sodium chloride 0.9 % injection           . sodium chloride 0.9 % injection           . DISCONTD: enalaprilat (VASOTEC) injection 1.25 mg  1.25 mg Intravenous Q6H Christiane Ha, MD   1.25 mg at 04/17/12 1323  . DISCONTD: furosemide (LASIX) injection 40 mg  40 mg Intravenous Daily Christiane Ha, MD   40 mg at 04/17/12 1106  . DISCONTD: metoprolol (LOPRESSOR) injection 5 mg  5 mg Intravenous Q6H Christiane Ha, MD   5 mg at 04/18/12 0520    Allergies as of 04/15/2012  . (No Known Allergies)    Family History:There is no known family history of colorectal carcinoma , liver disease, or inflammatory bowel disease.   History   Social History  . Marital Status: Widowed    Spouse Name: N/A    Number of Children: 3  . Years of Education: N/A   Occupational History  . retired Neurosurgeon    Social History Main Topics  . Smoking status: Never Smoker   . Smokeless tobacco: Not on file  . Alcohol Use: No  . Drug Use: No  . Sexually Active: Not on file   Other Topics Concern  . Not on file   Social History  Narrative   Lives w/ mentally-challenged son2 other living children    Review of Systems: See HPI, otherwise negative ROS  Physical Exam: Vital signs in last 24 hours: Temp:  [97.6 F (  36.4 C)-99.7 F (37.6 C)] 97.6 F (36.4 C) (10/13 2322) Pulse Rate:  [61-91] 91  (10/14 0800) Resp:  [15-35] 21  (10/14 0800) BP: (88-195)/(39-178) 123/71 mmHg (10/14 0800) SpO2:  [93 %-98 %] 97 % (10/14 0800) Weight:  [100 lb 15.5 oz (45.8 kg)] 100 lb 15.5 oz (45.8 kg) (10/14 0500) Last BM Date: 04/16/12 No LMP recorded. Patient has had a hysterectomy. General:   Alert,  Cachectic, and cooperative in NAD.  Accompanied by daughter-in-law. Head:  Normocephalic and atraumatic. Eyes:  Sclera clear, no icterus.   Conjunctiva pink. Ears:  Normal auditory acuity. Nose:  No deformity, discharge, or lesions. Mouth:  No deformity or lesions,oropharynx pink & moist. Neck:  Supple; no masses or thyromegaly. Lungs:  Clear throughout to auscultation.   No wheezes, crackles, or rhonchi. No acute distress. Heart:  Regular rate and rhythm; no murmurs, clicks, rubs,  or gallops. Abdomen:  Flat.  Normal bowel sounds.  No bruits.  Soft, non-tender and non-distended without masses, hepatosplenomegaly or hernias noted.  No guarding or rebound tenderness.   Rectal:  Deferred. Msk:  Symmetrical  Pulses:  Normal pulses noted. Extremities:  No edema.  Chronic arthritis changes. Neurologic:  Alert and oriented x2;  grossly normal neurologically. Skin:  Intact without significant lesions or rashes. Lymph Nodes:  No significant cervical adenopathy. Psych:  Alert and cooperative. Normal mood and affect.  Intake/Output from previous day: 10/13 0701 - 10/14 0700 In: 534 [P.O.:60; I.V.:20; IV Piggyback:454] Out: 2550 [Urine:2550] Intake/Output this shift: Total I/O In: 30 [P.O.:30] Out: -   Lab Results:  Basename 04/18/12 0510 04/17/12 0455 04/16/12 0451  WBC -- -- 5.7  HGB 9.3* 7.7* 7.6*  HCT 31.3* 26.2* 26.2*    PLT -- -- 301   BMET  Basename 04/18/12 0510 04/17/12 0455 04/16/12 0451  NA 141 137 143  K 3.3* 3.0* 4.3  CL 105 101 109  CO2 23 26 26   GLUCOSE 108* 161* 85  BUN 17 11 14   CREATININE 1.00 0.75 0.80  CALCIUM 11.2* 10.5 10.1   LFT  Basename 04/15/12 1039  PROT 5.7*  ALBUMIN 2.9*  AST 20  ALT 10  ALKPHOS 56  BILITOT 0.4  BILIDIR 0.1  IBILI 0.3  LIPASE --  AMYLASE --    Studies/Results: Ct Head Wo Contrast  04/17/2012  *RADIOLOGY REPORT*  Clinical Data: Headache.  Confusion.  Meningioma.  CT HEAD WITHOUT CONTRAST  Technique:  Contiguous axial images were obtained from the base of the skull through the vertex without contrast.  Comparison: 05/08/2008  Findings: Partially calcified meningioma in the right frontal region shows mild increase in size, now measuring approximately 0.5 x 3.4 cm.  There is increased associated vasogenic edema in the high right frontal lobe, with increased mass effect on the right frontal horn.  Mild right to left midline shift shows mild increase in the frontal region measuring approximately 10 mm.  No evidence of intracranial hemorrhage, acute cerebral infarction or abnormal extra-axial fluid collections.  No evidence of hydrocephalus.  IMPRESSION:  Mild increase in size of right frontal meningioma, with increased vasogenic edema in the high right frontal lobe.  Mild increase in mass effect and right to left midline shift.   Original Report Authenticated By: Danae Orleans, M.D.     Impression: Madison Hickman is a pleasant 79 y.o. female admitted with chest pain & new onset CHF.  She was found to have profound iron deficiency anemia.  Also noted to have B-12 deficiency.  She has constipation, weight loss, early satiety, GERD & dysphagia.  Cannot r/o GI malignancy including colorectal or esophageal carcinoma.  She is quite weak & family is unsure she will be able to drink prep for colonoscopy at this time.  Spoke w/ son, Melitta Tigue [phone]) & he really  does not want to pursue invasive work-up if at all possible.  Discussed w/ Dr Darrick Penna.  Plan for EGD with possible esophageal dilation if needed.  Son, [name], agrees with this plan.    Plan: 1. Agree w/ Protonix 40mg  daily 2. EGD with possible esophageal dilation with Dr Darrick Penna.  I have discussed risks & benefits which include, but are not limited to, bleeding, infection, perforation & drug reaction.  Son agrees with this plan & written consent will be obtained.   3. NPO for procedure 4. Further evaluation of IDA pending EGD.  May need CT imaging if unable to prep for colonoscopy.   LOS: 3 days   Lorenza Burton  04/18/2012, 9:43 AM [facility]

## 2012-04-18 NOTE — Progress Notes (Signed)
EEG completed at bedside as ordered °

## 2012-04-18 NOTE — Progress Notes (Addendum)
Subjective: This 78 year old frail lady has multiple problems. She has a frontal meningioma with right-to-left shift and vasogenic edema, iron deficiency anemia and a degree of delirium. She has had difficulty in controlling blood pressure. She has gram-negative rod UTI. She has difficult living situation.           Physical Exam: Blood pressure 92/75, pulse 71, temperature 97.6 F (36.4 C), temperature source Axillary, resp. rate 26, height 5\' 1"  (1.549 m), weight 45.8 kg (100 lb 15.5 oz), SpO2 98.00%. She looks very frail. She is currently hypotensive. Heart sounds are present and normal sinus rhythm. Lung fields are clear. There are no obvious murmurs. She does appear to be alert and mostly orientated in time and place. She is able to answer questions appropriately. She looks somewhat cachectic.   Investigations:  Recent Results (from the past 240 hour(s))  URINE CULTURE     Status: Normal (Preliminary result)   Collection Time   04/15/12  8:30 AM      Component Value Range Status Comment   Specimen Description URINE, CATHETERIZED   Final    Special Requests NONE   Final    Culture  Setup Time 04/16/2012 01:43   Final    Colony Count >=100,000 COLONIES/ML   Final    Culture GRAM NEGATIVE RODS   Final    Report Status PENDING   Incomplete   MRSA PCR SCREENING     Status: Normal   Collection Time   04/15/12 12:53 PM      Component Value Range Status Comment   MRSA by PCR NEGATIVE  NEGATIVE Final      Basic Metabolic Panel:  Basename 04/18/12 0510 04/17/12 0455 04/15/12 1039  NA 141 137 --  K 3.3* 3.0* --  CL 105 101 --  CO2 23 26 --  GLUCOSE 108* 161* --  BUN 17 11 --  CREATININE 1.00 0.75 --  CALCIUM 11.2* 10.5 --  MG -- -- 2.2  PHOS -- -- --   Liver Function Tests:  Basename 04/15/12 1039  AST 20  ALT 10  ALKPHOS 56  BILITOT 0.4  PROT 5.7*  ALBUMIN 2.9*     CBC:  Basename 04/18/12 0510 04/17/12 0455 04/16/12 0451  WBC -- -- 5.7  NEUTROABS  -- -- --  HGB 9.3* 7.7* --  HCT 31.3* 26.2* --  MCV -- -- 76.4*  PLT -- -- 301    Ct Head Wo Contrast  04/17/2012  *RADIOLOGY REPORT*  Clinical Data: Headache.  Confusion.  Meningioma.  CT HEAD WITHOUT CONTRAST  Technique:  Contiguous axial images were obtained from the base of the skull through the vertex without contrast.  Comparison: 05/08/2008  Findings: Partially calcified meningioma in the right frontal region shows mild increase in size, now measuring approximately 0.5 x 3.4 cm.  There is increased associated vasogenic edema in the high right frontal lobe, with increased mass effect on the right frontal horn.  Mild right to left midline shift shows mild increase in the frontal region measuring approximately 10 mm.  No evidence of intracranial hemorrhage, acute cerebral infarction or abnormal extra-axial fluid collections.  No evidence of hydrocephalus.  IMPRESSION:  Mild increase in size of right frontal meningioma, with increased vasogenic edema in the high right frontal lobe.  Mild increase in mass effect and right to left midline shift.   Original Report Authenticated By: Danae Orleans, M.D.       Medications: I have reviewed the patient's current  medications.  Impression: 1. Right frontal meningioma with vasogenic edema and right to left shift. 2. Microcytic anemia with history of dysphagia. 3. Hypercalcemia,? Related to malignancy. 4. Acute diastolic congestive heart failure, ejection fraction 50-55%,currently compensated. 5. Gram-negative rod UTI. Await identification. 6. Malignant hypertension, now improved control.     Plan: 1. Reduce IV metoprolol. 2. Gastroenterology consultation. I think this lady does need endoscopy, initially EGD. 3. Neurology consultation. I don't really think she is a candidate for surgery but I would like an opinion. In the meantime steroids could possibly be useful. 4. Disposition-this lady cannot return home and she will need alternative  disposition from the hospital, probably assisted living facility. DO NOT RESUSCITATE is appropriate.     LOS: 3 days   Wilson Singer Pager [phone]  04/18/2012, 7:47 AM

## 2012-04-18 NOTE — Consult Note (Signed)
[facility] [name] A. [name], MD     [email or web address]          Madison Hickman is an 78 y.o. female.  HPI: The patient is an 78 year old white female who presents with what appears to be congestive heart failure. The patient has been noted to be somewhat confused and hence a CT scan was obtained. The scan shows a right frontal meningioma which apparently has been old. This was evaluated 2009 and surgery was not recommended. The meningioma apparently shows increased size and hence a neurological consultation. The patient does have multiple causes to explain her confusion including hypercalcemia, microcystic anemia with hemoglobin of 7.9, and a acute urinary tract infection.The patient also has hypokalemia.   Past Medical History  Diagnosis Date  . Back pain, chronic   . Headache   . Hypertension   . Arthritis   . Meningioma 04/15/2012    Past Surgical History  Procedure Date  . Abdominal hysterectomy   . Arm surgery     No family history on file.  Social History:  does not have a smoking history on file. She does not have any smokeless tobacco history on file. She reports that she does not drink alcohol or use illicit drugs.  Allergies: No Known Allergies  Medications:  Prior to Admission medications   Medication Sig Start Date End Date Taking? Authorizing Provider  ibuprofen (ADVIL,MOTRIN) 200 MG tablet Take 600 mg by mouth every 6 (six) hours as needed. For headache   Yes Historical Provider, MD  polyethylene glycol (GOLYTELY) 236 G solution 8 ounces every 15 minutes until you are passing clear liquid 02/14/12  Yes [name], MD  traMADol (ULTRAM-ER) 100 MG 24 hr tablet Take 100 mg by mouth daily.     Yes Historical Provider, MD  UNKNOWN TO PATIENT BP pill    Yes Historical Provider, MD   Scheduled Meds:   . cefTRIAXone (ROCEPHIN)  IV  1 g Intravenous Q24H  . cyanocobalamin  1,000 mcg Intramuscular Daily  . enalaprilat  0.625 mg Intravenous Q6H  . furosemide   20 mg Oral Daily  . metoprolol  5 mg Intravenous Q8H  . pantoprazole (PROTONIX) IV  40 mg Intravenous Q24H  . potassium chloride  10 mEq Intravenous Q1 Hr x 4  . sodium chloride  3 mL Intravenous Q12H  . sodium chloride      . sodium chloride      . sodium chloride      . DISCONTD: aspirin EC  81 mg Oral Daily  . DISCONTD: docusate sodium  100 mg Oral BID  . DISCONTD: enalaprilat  1.25 mg Intravenous Q6H  . DISCONTD: furosemide  40 mg Intravenous Daily  . DISCONTD: furosemide  40 mg Oral Daily  . DISCONTD: lisinopril  20 mg Oral Daily  . DISCONTD: metoprolol  5 mg Intravenous Q6H  . DISCONTD: nitrofurantoin (macrocrystal-monohydrate)  100 mg Oral Q12H  . DISCONTD: potassium chloride  10 mEq Oral Daily   Continuous Infusions:   . DISCONTD: nitroGLYCERIN 66.667 mcg/min (04/17/12 0800)   PRN Meds:.acetaminophen, acetaminophen, albuterol, alum & mag hydroxide-simeth, hydrALAZINE, HYDROcodone-acetaminophen, LORazepam, magnesium hydroxide, ondansetron (ZOFRAN) IV, ondansetron   Results for orders placed during the hospital encounter of 04/15/12 (from the past 48 hour(s))  BASIC METABOLIC PANEL     Status: Abnormal   Collection Time   04/17/12  4:55 AM      Component Value Range Comment   Sodium 137  135 - 145 mEq/L  Potassium 3.0 (*) 3.5 - 5.1 mEq/L DELTA CHECK NOTED   Chloride 101  96 - 112 mEq/L    CO2 26  19 - 32 mEq/L    Glucose, Bld 161 (*) 70 - 99 mg/dL    BUN 11  6 - 23 mg/dL    Creatinine, Ser 4.09  0.50 - 1.10 mg/dL    Calcium 81.1  8.4 - 10.5 mg/dL    GFR calc non Af Amer 75 (*) >90 mL/min    GFR calc Af Amer 86 (*) >90 mL/min   HEMOGLOBIN AND HEMATOCRIT, BLOOD     Status: Abnormal   Collection Time   04/17/12  4:55 AM      Component Value Range Comment   Hemoglobin 7.7 (*) 12.0 - 15.0 g/dL    HCT 91.4 (*) 78.2 - 46.0 %   HEMOGLOBIN AND HEMATOCRIT, BLOOD     Status: Abnormal   Collection Time   04/18/12  5:10 AM      Component Value Range Comment   Hemoglobin  9.3 (*) 12.0 - 15.0 g/dL DELTA CHECK NOTED   HCT 31.3 (*) 36.0 - 46.0 %   BASIC METABOLIC PANEL     Status: Abnormal   Collection Time   04/18/12  5:10 AM      Component Value Range Comment   Sodium 141  135 - 145 mEq/L    Potassium 3.3 (*) 3.5 - 5.1 mEq/L    Chloride 105  96 - 112 mEq/L    CO2 23  19 - 32 mEq/L    Glucose, Bld 108 (*) 70 - 99 mg/dL    BUN 17  6 - 23 mg/dL    Creatinine, Ser 9.56  0.50 - 1.10 mg/dL    Calcium 21.3 (*) 8.4 - 10.5 mg/dL    GFR calc non Af Amer 50 (*) >90 mL/min    GFR calc Af Amer 57 (*) >90 mL/min     Ct Head Wo Contrast  04/17/2012  *RADIOLOGY REPORT*  Clinical Data: Headache.  Confusion.  Meningioma.  CT HEAD WITHOUT CONTRAST  Technique:  Contiguous axial images were obtained from the base of the skull through the vertex without contrast.  Comparison: 05/08/2008  Findings: Partially calcified meningioma in the right frontal region shows mild increase in size, now measuring approximately 0.5 x 3.4 cm.  There is increased associated vasogenic edema in the high right frontal lobe, with increased mass effect on the right frontal horn.  Mild right to left midline shift shows mild increase in the frontal region measuring approximately 10 mm.  No evidence of intracranial hemorrhage, acute cerebral infarction or abnormal extra-axial fluid collections.  No evidence of hydrocephalus.  IMPRESSION:  Mild increase in size of right frontal meningioma, with increased vasogenic edema in the high right frontal lobe.  Mild increase in mass effect and right to left midline shift.   Original Report Authenticated By: Danae Orleans, M.D.     Review of Systems  Unable to perform ROS: mental status change   Blood pressure 123/71, pulse 91, temperature 97.6 F (36.4 C), temperature source Axillary, resp. rate 21, height 5\' 1"  (1.549 m), weight 45.8 kg (100 lb 15.5 oz), SpO2 97.00%. Physical Exam GENERAL: This is a pleasant thin lady who is in no acute distress.  HEENT:  Retro-palatal space Is fine. Neck is supple. Head is normocephalic atraumatic.  ABDOMEN: soft  EXTREMITIES: No edema   BACK: Unremarkable.  SKIN: Normal by inspection.    MENTAL STATUS:  The patient is awake and alert. She is somewhat resistant to the evaluation. She is oriented to person and place. She does follow commands well.  CRANIAL NERVES: Pupils are equal, round and reactive to light and accomodation; extra ocular movements are full, there is no significant nystagmus; visual fields are full; upper and lower facial muscles are normal in strength and symmetric, there is no flattening of the nasolabial folds; tongue is midline; uvula is midline; shoulder elevation is normal.  MOTOR: Bulk and tone are normal. She has at least antigravity strength but the exact strength is not obtainable due to lack of cooperation.  COORDINATION: Left finger to nose is normal, right finger to nose is normal, No rest tremor; no intention tremor; no postural tremor; no bradykinesia.  REFLEXES: Deep tendon reflexes are symmetrical and normal. Babinski reflexes are flexor bilaterally.   SENSATION: Normal to light touch.  The patient's head CT scan is reviewed. It is compared to the MRI and head CT scan done in 2009. There is mild increase in the size from 2009. This can both the one from today and the previous scan both shows significant vasogenic edema.  Assessment/Plan: 1. Mild to moderate encephalopathy due to multiple etiologies including multiple metabolic arrangements and possible effect of meningioma. The patient does seem to have significant vasogenic edema which actually was present from the previous scan.  I do not believe that surgery is absolutely warranted at this time given the Medical conditions and age. This can be managed medically. Additionally, the patient and family seem not to be very excited about doing surgery. I would suggest that the patient be placed on steroids. I will give her a  Medrol 500 mg for 2 days. Subsequently, she can be placed on prednisone 60 mg for a couple weeks and wean afterwards. An EEG will be obtained as the manager by can be associated with seizures.  Emmanuela Ghazi 04/18/2012, 8:38 AM

## 2012-04-18 NOTE — Progress Notes (Signed)
PT Cancellation Note  Patient Details Name: Madison Hickman MRN: [id number] DOB: Mar 13, 1924   Cancelled Treatment:    Reason Eval/Treat Not Completed: Patient's level of consciousness;patient only oriented to name and communicating with moans.Patient unable to open eyes is restless and wanting to lay sideways in bed. Made her comfortable and will attempt therapy at later date when appropriate.   [name] ATKINSO 04/18/2012, 12:56 PM

## 2012-04-18 NOTE — Clinical Documentation Improvement (Signed)
CHF DOCUMENTATION CLARIFICATION QUERY  THIS DOCUMENT IS NOT A PERMANENT PART OF THE MEDICAL RECORD  TO RESPOND TO THE THIS QUERY, FOLLOW THE INSTRUCTIONS BELOW:  1. If needed, update documentation for the patient's encounter via the notes activity.  2. Access this query again and click edit on the In [employer].  3. After updating, or not, click F2 to complete all highlighted (required) fields concerning your review. Select "additional documentation in the medical record" OR "no additional documentation provided".  4. Click Sign note button.  5. The deficiency will fall out of your In Basket *Please let us know if you are not able to complete this workflow by phone or e-mail (listed below).  Please update your documentation within the medical record to reflect your response to this query.                                                                                    04/18/12  Dear Dr. [facility],  In a better effort to capture your patient's severity of illness, reflect appropriate length of stay and utilization of resources, a review of the patient medical record has revealed the following indicators the diagnosis of Heart Failure.    Based on your clinical judgment, please clarify and document in a progress note and/or discharge summary the clinical condition associated with the following supporting information:  In responding to this query please exercise your independent judgment.  The fact that a query is asked, does not imply that any particular answer is desired or expected.  Please clarify acuity and type of "newly diagnosed CHF"  Possible Clinical Conditions?  Chronic Systolic Congestive Heart Failure Chronic Diastolic Congestive Heart Failure Chronic Systolic & Diastolic Congestive Heart Failure Acute Systolic Congestive Heart Failure Acute Diastolic Congestive Heart Failure Acute Systolic & Diastolic Congestive Heart Failure Acute on Chronic Systolic  Congestive Heart Failure Acute on Chronic Diastolic Congestive Heart Failure Acute on Chronic Systolic & Diastolic  Congestive Heart Failure Other Condition________________________________________ Cannot Clinically Determine  Supporting Information:  Clinical Information:  Risk Factors: Newly diagnosed CHF Profound iron deficiency anemia  Advanced age Pulmonary edema  Signs & Symptoms: +3 pitting edema Rales Shortness of breath  Diagnostics: BNP: 5133  Echo results: EF: 50-55%  Radiology: CXR=pulmonary edema  Treatment: Daily weights Monitor intake & outputs Diuretics: IV Lasix 20mg  in ED 10/11 Lasix 40mg  po 10/12 IV Lasix 40mg  10/13 Lasix po 20mg  10/14  Reviewed: additional documentation in the medical record  Thank You,  Debora T Williams RN, MSN Clinical Documentation Specialist: Office# [phone] [facility] Information Management [facility]

## 2012-04-18 NOTE — Consult Note (Signed)
REVIEWED.  

## 2012-04-18 NOTE — H&P (Signed)
  Primary Care Physician:  No primary provider on file. Primary Gastroenterologist:  Dr. Darrick Penna  Pre-Procedure History & Physical: HPI:  Madison Hickman is a 78 y.o. female here for  ANEMIA/DYSPHAGIA. Uses NSAIDS.   Past Medical History  Diagnosis Date  . Back pain, chronic   . Headache   . Hypertension   . Arthritis   . Meningioma 04/15/2012  . IDA (iron deficiency anemia)     Past Surgical History  Procedure Date  . Abdominal hysterectomy   . Arm surgery     Prior to Admission medications   Medication Sig Start Date End Date Taking? Authorizing Provider  ibuprofen (ADVIL,MOTRIN) 200 MG tablet Take 600 mg by mouth every 6 (six) hours as needed. For headache   Yes Historical Provider, MD  traMADol (ULTRAM-ER) 100 MG 24 hr tablet Take 100 mg by mouth daily.     Yes Historical Provider, MD  UNKNOWN TO PATIENT BP pill    Yes Historical Provider, MD    Allergies as of 04/15/2012  . (No Known Allergies)    History reviewed. No pertinent family history.  History   Social History  . Marital Status: Widowed    Spouse Name: N/A    Number of Children: 3  . Years of Education: N/A   Occupational History  . retired Neurosurgeon    Social History Main Topics  . Smoking status: Never Smoker   . Smokeless tobacco: Not on file  . Alcohol Use: No  . Drug Use: No  . Sexually Active: Not on file   Other Topics Concern  . Not on file   Social History Narrative   Lives w/ mentally-challenged son2 other living children    Review of Systems: See HPI, otherwise negative ROS   Physical Exam: BP 167/94  Pulse 77  Temp 97.7 F (36.5 C) (Axillary)  Resp 20  Ht 5\' 1"  (1.549 m)  Wt 100 lb 15.5 oz (45.8 kg)  BMI 19.08 kg/m2  SpO2 97% General:   Alert,  pleasant and cooperative in NAD Head:  Normocephalic and atraumatic. Neck:  Supple; Lungs:  Clear throughout to auscultation.    Heart:  Regular rate and rhythm. Abdomen:  Soft, nontender and nondistended. Normal bowel  sounds, without guarding, and without rebound.   Neurologic:  Alert and  oriented x4;  grossly normal neurologically.  Impression/Plan:     Anemia/DYSPHAGIA  PLAN:  EGD/DIL TODAY

## 2012-04-18 NOTE — Clinical Social Work Note (Signed)
CSW discussed pt with MD. Awaiting further testing to determine appropriate d/c plan. CSW to continue to follow.   Derenda Fennel, [place] [phone]

## 2012-04-18 NOTE — Op Note (Addendum)
[facility] [address] [place], [address]   ENDOSCOPY PROCEDURE REPORT  PATIENT: Madison, Hickman  MR#: [id number] BIRTHDATE: 04/14/1924 , 88  yrs. old GENDER: Female  ENDOSCOPIST: Jonette Eva, MD REFFERED BY:   DR. Karilyn Cota  PROCEDURE DATE:  04/18/2012 PROCEDURE:   EGD with biopsy and EGD with dilatation over guidewire   INDICATIONS:1.  dysphagia.   2.  iron deficiency anemia. MEDICATIONS: Demerol 25 mg IV and Versed 2 mg IV TOPICAL ANESTHETIC: Cetacaine Spray  DESCRIPTION OF PROCEDURE:   After the risks benefits and alternatives of the procedure were thoroughly explained, informed consent was obtained.  The EG-[id number] ([id number])  endoscope was introduced through the mouth and advanced to the second portion of the duodenum.  The instrument was slowly withdrawn as the mucosa was carefully examined.  Prior to withdrawal of the scope, the guidwire was placed.  The esophagus was dilated successfully.  The patient was recovered in endoscopy and discharged home in satisfactory condition.      ESOPHAGUS: The mucosa of the esophagus appeared normal.   A small hiatal hernia was noted.  STOMACH: Moderate non-erosive gastritis (inflammation) was found in the entire examined stomach.  Multiple biopsies were performed using cold forceps.  DUODENUM: The duodenal mucosa showed no abnormalities in the duodenal bulb.   Dilation was then performed at the gastroesphageal junction  Dilator: Savary over guidewire Size(s): 12.8 mm-16 mm Resistance: minimal  COMPLICATIONS: There were no complications.   ENDOSCOPIC IMPRESSION: 1.   The mucosa of the esophagus appeared normal 2.   Small hiatal hernia 3.   Non-erosive gastritis (inflammation) was found in the entire examined stomach; multiple biopsies 4.   The duodenal mucosa showed no abnormalities in the duodenal bulb 5.    NO OBVIOUS SOURCE FOR PROFOUND ANEMIA IDENTIFIED.  MODERATE GASTRITIS CONTRIBUTES TO  ANEMIA.  EMPIRIC DILATIO PERFORMED DUE TO DYPSHAGIA.  NO DEFINITE STRICTURE APPRECIATED.  RECOMMENDATIONS: 1.  await biopsy results 2.  DAILY PPI  SOFT MECHANICAL DIET.  ASSIST WITH MEALS.  AVOID NSAIDS. MAY USE ASA IF MEDICALLY NECESSARY.  PT UNLIKELY ABLE TO TAKE A BOWEL PREP FOR COLONOSCOPY.  CONSIDER CT A/P TO EVALUATE FOR COLON MASS.      _______________________________ Rosalie DoctorJonette Eva, MD 04/18/2012 4:53 PM Revised: 04/18/2012 4:53 PM     PATIENT NAME:  Madison, Hickman MR#: [id number]

## 2012-04-18 NOTE — Progress Notes (Signed)
SLP Cancellation Note  Patient Details Name: Madison Hickman MRN: [id number] DOB: 03/04/1924   Cancelled treatment:       Reason Eval/Treat Not Completed: Patient at procedure or test/unavailable (Pt NPO pending EGD)   Kyrielle Urbanski 04/18/2012, 12:17 PM

## 2012-04-18 NOTE — Progress Notes (Signed)
Consult for neurology and gastroenterology for patient.  Dr. [name] just responded to the page.  Awaiting GI to respond back to the page notifying of consult for patient.  Consult requested by Dr. Karilyn Cota.

## 2012-04-19 ENCOUNTER — Inpatient Hospital Stay (HOSPITAL_COMMUNITY): Payer: Medicare Other

## 2012-04-19 DIAGNOSIS — R1310 Dysphagia, unspecified: Secondary | ICD-10-CM

## 2012-04-19 DIAGNOSIS — D509 Iron deficiency anemia, unspecified: Secondary | ICD-10-CM

## 2012-04-19 LAB — URINE CULTURE

## 2012-04-19 LAB — CBC
HCT: 30.7 % — ABNORMAL LOW (ref 36.0–46.0)
Hemoglobin: 9 g/dL — ABNORMAL LOW (ref 12.0–15.0)
MCH: 21.9 pg — ABNORMAL LOW (ref 26.0–34.0)
MCHC: 29.3 g/dL — ABNORMAL LOW (ref 30.0–36.0)
RDW: 17.5 % — ABNORMAL HIGH (ref 11.5–15.5)

## 2012-04-19 LAB — COMPREHENSIVE METABOLIC PANEL
BUN: 29 mg/dL — ABNORMAL HIGH (ref 6–23)
Calcium: 11.2 mg/dL — ABNORMAL HIGH (ref 8.4–10.5)
Creatinine, Ser: 0.81 mg/dL (ref 0.50–1.10)
GFR calc Af Amer: 74 mL/min — ABNORMAL LOW (ref >90)
Glucose, Bld: 124 mg/dL — ABNORMAL HIGH (ref 70–99)
Total Protein: 7.4 g/dL (ref 6.0–8.3)

## 2012-04-19 MED ORDER — POTASSIUM CHLORIDE CRYS ER 20 MEQ PO TBCR
40.0000 meq | EXTENDED_RELEASE_TABLET | Freq: Once | ORAL | Status: AC
  Administered 2012-04-19: 40 meq via ORAL
  Filled 2012-04-19: qty 2

## 2012-04-19 MED ORDER — SODIUM CHLORIDE 0.9 % IJ SOLN
INTRAMUSCULAR | Status: AC
  Filled 2012-04-19: qty 3

## 2012-04-19 MED ORDER — IOHEXOL 300 MG/ML  SOLN
100.0000 mL | Freq: Once | INTRAMUSCULAR | Status: AC | PRN
  Administered 2012-04-19: 100 mL via INTRAVENOUS

## 2012-04-19 MED ORDER — METOPROLOL TARTRATE 25 MG PO TABS
25.0000 mg | ORAL_TABLET | Freq: Two times a day (BID) | ORAL | Status: DC
  Administered 2012-04-19 (×2): 25 mg via ORAL
  Filled 2012-04-19 (×2): qty 1

## 2012-04-19 MED ORDER — ENALAPRIL MALEATE 5 MG PO TABS
10.0000 mg | ORAL_TABLET | Freq: Two times a day (BID) | ORAL | Status: DC
  Administered 2012-04-19 (×2): 10 mg via ORAL
  Filled 2012-04-19 (×2): qty 2

## 2012-04-19 NOTE — Progress Notes (Signed)
Pt transferred to room 301. Pt continues to be hypertensive, otherwise vitals stable. Report given to Loletta Specter RN.

## 2012-04-19 NOTE — Procedures (Signed)
[facility] Devra Stare A. [name], MD     [email or web address]         FACILITY: [facility]  PHYSICIAN: Harvest Stanco A. [name], M.D.  DATE OF PROCEDURE:  EEG INTERPRETATION  INDICATION: The patient is a 78 year old lady who presented on mental status. There is a history of a right frontal meningioma. The study is being done to evaluate for nonconvulsive seizures.  MEDICATIONS:  Scheduled Meds:   . cefTRIAXone (ROCEPHIN)  IV  1 g Intravenous Q24H  . cyanocobalamin  1,000 mcg Intramuscular Daily  . enalapril  10 mg Oral BID  . furosemide  20 mg Oral Daily  . meperidine      . methylPREDNISolone (SOLU-MEDROL) injection  500 mg Intravenous Q24H  . metoprolol tartrate  25 mg Oral BID  . midazolam      . mineral oil      . pantoprazole  40 mg Oral QAC breakfast  . potassium chloride  40 mEq Oral Once  . sodium chloride  3 mL Intravenous Q12H  . DISCONTD: enalaprilat  0.625 mg Intravenous Q6H  . DISCONTD: metoprolol  5 mg Intravenous Q8H  . DISCONTD: pantoprazole (PROTONIX) IV  40 mg Intravenous Q24H   Continuous Infusions:   . sodium chloride     PRN Meds:.acetaminophen, acetaminophen, albuterol, alum & mag hydroxide-simeth, hydrALAZINE, HYDROcodone-acetaminophen, LORazepam, magnesium hydroxide, ondansetron (ZOFRAN) IV, ondansetron, DISCONTD: meperidine, DISCONTD: midazolam, DISCONTD: simethicone susp in sterile water 1000 mL irrigation   ANALYSIS: A 16-channel recording using standard 10/20 measurements is conducted for 21 minutes. There is a well-formed posterior dominant rhythm consists highest and a half hertz bilaterally. There is beta activity observed in the frontal areas. Awake and sleep activities are recorded. K complexes and sleep spindles are observed. There is no focal or lateralized slowing there is no epileptic form activity observed.   IMPRESSION: This recording of the awake and sleep states shows mild generalized slowing. Otherwise unremarkable.   Shatoya Roets A. [name],  M.D. Diplomate, Biomedical engineer of Psychiatry and Neurology ( Neurology).

## 2012-04-19 NOTE — Progress Notes (Signed)
Subjective: Pt denies any abdominal pain, nausea or vomiting.  Pleasantly confused & states "I want to go home".  Objective: Vital signs in last 24 hours: Temp:  [97.7 F (36.5 C)-100.7 F (38.2 C)] 97.7 F (36.5 C) (10/14 1521) Pulse Rate:  [65-112] 77  (10/15 0600) Resp:  [18-37] 24  (10/15 0600) BP: (89-198)/(53-138) 179/108 mmHg (10/15 0600) SpO2:  [76 %-100 %] 97 % (10/15 0600) Weight:  [89 lb 4.6 oz (40.5 kg)] 89 lb 4.6 oz (40.5 kg) (10/15 0500) Last BM Date: 04/18/12 No LMP recorded. Patient has had a hysterectomy. Body mass index is 16.87 kg/(m^2). General:   Alert, pleasantly confused.  Cooperative in NAD Eyes:  Sclera clear, no icterus.   Conjunctiva pink. Mouth:  oropharynx pink & moist. Heart:  Regular rate and rhythm Abdomen:   Normal bowel sounds.  Soft, nontender and nondistended.  Msk:  Symmetrical without gross deformities.  Extremities:  Without edema. Neurologic:  Alert, but disoriented.    Intake/Output from previous day: 10/14 0701 - 10/15 0700 In: 93 [P.O.:30; I.V.:3; IV Piggyback:60] Out: 1050 [Urine:1050]  Lab Results:  [facility] 04/19/12 0432 04/18/12 0510 04/17/12 0455  WBC 4.7 -- --  HGB 9.0* 9.3* 7.7*  HCT 30.7* 31.3* 26.2*  PLT 371 -- --   BMET  Basename 04/19/12 0432 04/18/12 0510 04/17/12 0455  NA 144 141 137  K 3.4* 3.3* 3.0*  CL 106 105 101  CO2 23 23 26   GLUCOSE 124* 108* 161*  BUN 29* 17 11  CREATININE 0.81 1.00 0.75  CALCIUM 11.2* 11.2* 10.5   LFT  Basename 04/19/12 0432  PROT 7.4  ALBUMIN 3.7  AST 24  ALT 12  ALKPHOS 68  BILITOT 0.5  BILIDIR --  IBILI --  LIPASE --  AMYLASE --    Studies/Results: Ct Head Wo Contrast  04/17/2012  *RADIOLOGY REPORT*  Clinical Data: Headache.  Confusion.  Meningioma.  CT HEAD WITHOUT CONTRAST  Technique:  Contiguous axial images were obtained from the base of the skull through the vertex without contrast.  Comparison: 05/08/2008  Findings: Partially calcified meningioma in the right  frontal region shows mild increase in size, now measuring approximately 0.5 x 3.4 cm.  There is increased associated vasogenic edema in the high right frontal lobe, with increased mass effect on the right frontal horn.  Mild right to left midline shift shows mild increase in the frontal region measuring approximately 10 mm.  No evidence of intracranial hemorrhage, acute cerebral infarction or abnormal extra-axial fluid collections.  No evidence of hydrocephalus.  IMPRESSION:  Mild increase in size of right frontal meningioma, with increased vasogenic edema in the high right frontal lobe.  Mild increase in mass effect and right to left midline shift.   Original Report Authenticated By: Danae Orleans, M.D.     Assessment: 1. Iron deficiency anemia:  Source unknown.  CT A/P pending.   2. B12 deficiency: New diagnosis.  3. Dysphagia:  S/p empiric Savory dilation.  Plan: 1. FU CT A/P 2. Continue PPI indefinitely 3. B-12 repletion per attending/PCP  LOS: 4 days   Lorenza Burton  04/19/2012, 7:52 AM

## 2012-04-19 NOTE — Evaluation (Signed)
Clinical/Bedside Swallow Evaluation Patient Details  Name: Madison Hickman MRN: [id number] Date of Birth: [birth year]  Today's Date: 04/19/2012 Time: 1191-4782 SLP Time Calculation (min): 40 min  Past Medical History:  Past Medical History  Diagnosis Date  . Back pain, chronic   . Headache   . Hypertension   . Arthritis   . Meningioma 04/15/2012  . IDA (iron deficiency anemia)    Past Surgical History:  Past Surgical History  Procedure Date  . Abdominal hysterectomy   . Arm surgery    HPI:  Madison Hickman is an 78 yo female who was admitted from home (lives with a son) with a UTI. Pmx= CHF, chest pain, meningioma, sundowning, and toxic encephalopathy. GI was consulted and EGD with empirical dilation was done yesterday.    Assessment / Plan / Recommendation Clinical Impression  No overt signs and symptoms of aspiration at bedside, however pt only agreeable to a small amount. She reports that she has trouble with solids at home and gets full quickly. Suspect esophageal component (delayed emptying due to presbyesophagus) given that EGD showed no stricture (but was dilated). Pt is extremely thin and likely eats very little. Recommend D3 (mechanical soft) with thin liquids when alert and upright. Consume frequent, smaller meals 4-6x/day and follow with liquid wash. Pt states that she cooks for herself at home (unsure if this is accurate). Encourage high caloric foods/supplements per RD.    Aspiration Risk  Mild    Diet Recommendation Dysphagia 3 (Mechanical Soft);Thin liquid   Liquid Administration via: Cup;Straw Medication Administration: Whole meds with liquid (crush or break large pills in puree as needed) Supervision: Intermittent supervision to cue for compensatory strategies Compensations: Check for pocketing Postural Changes and/or Swallow Maneuvers: Seated upright 90 degrees;Upright 30-60 min after meal    Other  Recommendations Oral Care Recommendations: Oral care  BID Other Recommendations: Clarify dietary restrictions   Follow Up Recommendations  24 hour supervision/assistance    Frequency and Duration min 2x/week  1 week      SLP Swallow Goals Patient will consume recommended diet without observed clinical signs of aspiration with: Set-up   Swallow Study Prior Functional Status   Lived at home with a son.    General Date of Onset: 04/15/12 HPI: Madison Hickman is an 78 yo female who was admitted from home (lives with a son) with a UTI. Pmx= CHF, chest pain, meningioma, sundowning, and toxic encephalopathy. GI was consulted and EGD with empirical dilation was done yesterday.  Type of Study: Bedside swallow evaluation Previous Swallow Assessment: None on record Diet Prior to this Study: Dysphagia 3 (soft);Thin liquids Temperature Spikes Noted: No Respiratory Status: Room air Behavior/Cognition: Alert;Cooperative;Confused Oral Cavity - Dentition: Edentulous Self-Feeding Abilities: Needs set up Patient Positioning: Upright in bed Baseline Vocal Quality: Clear Volitional Cough: Weak Volitional Swallow: Able to elicit    Oral/Motor/Sensory Function Labial ROM: Within Functional Limits Labial Symmetry: Within Functional Limits Labial Strength: Within Functional Limits Labial Sensation: Within Functional Limits Lingual ROM: Reduced right;Reduced left Lingual Symmetry: Within Functional Limits Lingual Strength: Reduced Lingual Sensation: Within Functional Limits Facial ROM: Within Functional Limits Facial Symmetry: Within Functional Limits Facial Strength: Within Functional Limits Facial Sensation: Within Functional Limits Velum: Within Functional Limits Mandible: Within Functional Limits   Ice Chips Ice chips: Within functional limits Presentation: Spoon   Thin Liquid Thin Liquid: Within functional limits Presentation: Cup;Spoon;Straw    Nectar Thick Nectar Thick Liquid: Not tested   Honey Thick Honey Thick Liquid: Not tested  Puree Puree: Within functional limits Presentation: Spoon   Solid      Thank you,  Havery Moros, CCC-SLP [phone]  Solid: Within functional limits Presentation: Self Fed Other Comments: WFL in setting of edentulous status; Pt soften cracker in mouth some before mastication and follows with liquid wash.       [name] 04/19/2012,10:45 AM

## 2012-04-19 NOTE — Clinical Social Work Note (Signed)
CSW met with pt and pt's daughter-in-law at bedside following updated PT recommendation for SNF. CSW discussed placement process and provided SNF list. Pt is agreeable to [facility] and requests [facility] if possible. Contact information left with daughter-in-law for pt's son to reach CSW to discuss further. CSW will fax out FL2 and follow up with bed offers when available.   Derenda Fennel, [place] [phone]

## 2012-04-19 NOTE — Clinical Social Work Placement (Signed)
Clinical Social Work Department CLINICAL SOCIAL WORK PLACEMENT NOTE 04/19/2012  Patient:  Madison Hickman, Madison Hickman  Account Number:  [id number] Admit date:  04/15/2012  Clinical Social Worker:  Derenda Fennel, LCSW  Date/time:  04/19/2012 02:10 PM  Clinical Social Work is seeking post-discharge placement for this patient at the following level of care:   SKILLED NURSING   (*CSW will update this form in Epic as items are completed)   04/19/2012  Patient/family provided with [facility] Department of Clinical Social Work's list of facilities offering this level of care within the geographic area requested by the patient (or if unable, by the patient's family).  04/19/2012  Patient/family informed of their freedom to choose among providers that offer the needed level of care, that participate in Medicare, Medicaid or managed care program needed by the patient, have an available bed and are willing to accept the patient.  04/19/2012  Patient/family informed of [facility]' ownership interest in [facility], as well as of the fact that they are under no obligation to receive care at this facility.  PASARR submitted to EDS on 04/19/2012 PASARR number received from EDS on 04/19/2012  FL2 transmitted to all facilities in geographic area requested by pt/family on  04/19/2012 FL2 transmitted to all facilities within larger geographic area on   Patient informed that his/her managed care company has contracts with or will negotiate with  certain facilities, including the following:     Patient/family informed of bed offers received:   Patient chooses bed at  Physician recommends and patient chooses bed at    Patient to be transferred to  on   Patient to be transferred to facility by   The following physician request were entered in Epic:   Additional Comments:  Derenda Fennel, LCSW [phone]

## 2012-04-19 NOTE — Progress Notes (Signed)
Physical Therapy Treatment Patient Details Name: Madison Hickman MRN: [id number] DOB: Jan 25, 1924 Today's Date: 04/19/2012 Time: 1308-6578 PT Time Calculation (min): 42 min  PT Assessment / Plan / Recommendation Comments on Treatment Session  Pt is seen for gait assessment today.  She is very fatigued by her report, but agreeable to walk.  Today, she states that she uses a can for gait, so this was provided to her.  She was extremely unsteady and needed max assist to maintain stance with the cane. She and was instructed in gait with a walker and gait was more stable, but she still needed min assist to maintain stability when gait was challenged with turns of the head or turning with the walker.  Since her gait is more compromised than I had originally thought, I now would recommend SNF at d/c to attempt to improve gait stability after this recent illness.  I have discussed this with the pt.            Follow Up Recommendations  Post acute inpatient     Does the patient have the potential to tolerate intense rehabilitation  No, Recommend SNF  Barriers to Discharge        Equipment Recommendations  None recommended by PT    Recommendations for Other Services    Frequency     Plan Discharge plan needs to be updated;Frequency remains appropriate    Precautions / Restrictions     Pertinent Vitals/Pain     Mobility  Bed Mobility Supine to Sit: 5: Supervision Sit to Supine: 5: Supervision Details for Bed Mobility Assistance: trnasfer in bed more labored today, but pt is still able to perform without assist Transfers Sit to Stand: 4: Min assist;From bed Stand to Sit: 4: Min assist Details for Transfer Assistance: Pt is very unsteady upon standing and needs to be stabilized Ambulation/Gait Ambulation/Gait Assistance: 4: Min assist (max assist with cane) Ambulation Distance (Feet): 60 Feet Assistive device: Rolling walker;Straight cane Ambulation/Gait Assistance Details: pt states that  she uses a cane at home for gait...balance is significantly declined from last visit...she was instructed in gait with a walker, but has instability when she is challenged with movement of her head, or with turns. Gait Pattern: Trunk flexed;Shuffle Stairs: No Wheelchair Mobility Wheelchair Mobility: No    Exercises     PT Diagnosis:    PT Problem List:   PT Treatment Interventions:     PT Goals Acute Rehab PT Goals PT Goal: Ambulate - Progress: Progressing toward goal PT Goal: Up/Down Stairs - Progress: Not met  Visit Information  Last PT Received On: 04/19/12    Subjective Data  Subjective: I'm tired   Cognition       Balance     End of Session PT - End of Session Equipment Utilized During Treatment: Gait belt Activity Tolerance: Patient limited by fatigue (unable to do any ex due to fatigue) Patient left: in bed;with call bell/phone within reach;with bed alarm set;with family/visitor present   GP     Myrlene Broker L 04/19/2012, 1:49 PM

## 2012-04-19 NOTE — Progress Notes (Signed)
Subjective: This 78 year old lady had EGD yesterday. There was no major abnormalities apart from nonerosive gastritis in the entire stomach found. She was seen by neurology, Dr [name], who has started her on high-dose intravenous steroids and would not recommend any kind of surgery at this point. The family is not keen on any kind of surgery either. She does appear to be more alert now.           Physical Exam: Blood pressure 179/108, pulse 77, temperature 97.7 F (36.5 C), temperature source Axillary, resp. rate 24, height 5\' 1"  (1.549 m), weight 40.5 kg (89 lb 4.6 oz), SpO2 97.00%. She looks very frail.  Heart sounds are present and normal sinus rhythm. Lung fields are clear. There are no obvious murmurs. She does appear to be alert and mostly orientated in time and place. She is able to answer questions appropriately. She looks somewhat cachectic.   Investigations:  Recent Results (from the past 240 hour(s))  URINE CULTURE     Status: Normal   Collection Time   04/15/12  8:30 AM      Component Value Range Status Comment   Specimen Description URINE, CATHETERIZED   Final    Special Requests NONE   Final    Culture  Setup Time 04/16/2012 01:43   Final    Colony Count >=100,000 COLONIES/ML   Final    Culture ESCHERICHIA COLI   Final    Report Status 04/19/2012 FINAL   Final    Organism ID, Bacteria ESCHERICHIA COLI   Final   MRSA PCR SCREENING     Status: Normal   Collection Time   04/15/12 12:53 PM      Component Value Range Status Comment   MRSA by PCR NEGATIVE  NEGATIVE Final      Basic Metabolic Panel:  Basename 04/19/12 0432 04/18/12 0510  NA 144 141  K 3.4* 3.3*  CL 106 105  CO2 23 23  GLUCOSE 124* 108*  BUN 29* 17  CREATININE 0.81 1.00  CALCIUM 11.2* 11.2*  MG -- --  PHOS -- --   Liver Function Tests:  [facility] 04/19/12 0432  AST 24  ALT 12  ALKPHOS 68  BILITOT 0.5  PROT 7.4  ALBUMIN 3.7     CBC:  Basename 04/19/12 0432 04/18/12 0510   WBC 4.7 --  NEUTROABS -- --  HGB 9.0* 9.3*  HCT 30.7* 31.3*  MCV 74.7* --  PLT 371 --    Ct Head Wo Contrast  04/17/2012  *RADIOLOGY REPORT*  Clinical Data: Headache.  Confusion.  Meningioma.  CT HEAD WITHOUT CONTRAST  Technique:  Contiguous axial images were obtained from the base of the skull through the vertex without contrast.  Comparison: 05/08/2008  Findings: Partially calcified meningioma in the right frontal region shows mild increase in size, now measuring approximately 0.5 x 3.4 cm.  There is increased associated vasogenic edema in the high right frontal lobe, with increased mass effect on the right frontal horn.  Mild right to left midline shift shows mild increase in the frontal region measuring approximately 10 mm.  No evidence of intracranial hemorrhage, acute cerebral infarction or abnormal extra-axial fluid collections.  No evidence of hydrocephalus.  IMPRESSION:  Mild increase in size of right frontal meningioma, with increased vasogenic edema in the high right frontal lobe.  Mild increase in mass effect and right to left midline shift.   Original Report Authenticated By: Danae Orleans, M.D.  Medications: I have reviewed the patient's current medications.  Impression: 1. Right frontal meningioma with vasogenic edema and right to left shift. 2. Microcytic anemia with history of dysphagia.  EGD only showed nonerosive gastritis. During EGD, she also had esophageal dilatation. 3. Hypercalcemia,? Related to malignancy. 4. Acute diastolic congestive heart failure, ejection fraction 50-55%,currently compensated. 5. Escherichia coli UTI. Sensitive to ceftriaxone. 6. Malignant hypertension, now improved control.     Plan: 1. Discontinue IV antihypertensives and start oral antihypertensives. 2. Start a diet based on recommendations from speech language pathology. CT scan of the abdomen to look for any colonic mass. 3. Continue with IV steroids. 4. Patient can move to the  floor. 5. Disposition-this lady cannot return home and she will need alternative disposition from the hospital, probably assisted living facility. DO NOT RESUSCITATE is appropriate.     LOS: 4 days   Wilson Singer Pager [phone]  04/19/2012, 7:34 AM

## 2012-04-19 NOTE — Progress Notes (Signed)
[facility] Detria Cummings A. [name], MD     [email or web address]         Subjective: Interval History: No new complaints are reported. Overall things seems to be about the same.  Objective: Vital signs in last 24 hours: Temp:  [97.7 F (36.5 C)-100.7 F (38.2 C)] 97.7 F (36.5 C) (10/15 1010) Pulse Rate:  [65-112] 73  (10/15 1010) Resp:  [18-37] 22  (10/15 1010) BP: (89-198)/(53-138) 174/82 mmHg (10/15 1010) SpO2:  [76 %-100 %] 94 % (10/15 1010) Weight:  [40.5 kg (89 lb 4.6 oz)] 40.5 kg (89 lb 4.6 oz) (10/15 0500)  Intake/Output from previous day: 10/14 0701 - 10/15 0700 In: 93 [P.O.:30; I.V.:3; IV Piggyback:60] Out: 1050 [Urine:1050] Intake/Output this shift:   Nutritional status: Dysphagia  The patient is awake and alert. She is cooperative with evaluation but is not fully oriented. She thinks that she is at church. She does follow commands well and is conversational and mostly lucid. She moves both sides well.  Lab Results:  Basename 04/19/12 0432 04/18/12 0510  WBC 4.7 --  HGB 9.0* 9.3*  HCT 30.7* 31.3*  PLT 371 --  NA 144 141  K 3.4* 3.3*  CL 106 105  CO2 23 23  GLUCOSE 124* 108*  BUN 29* 17  CREATININE 0.81 1.00  CALCIUM 11.2* 11.2*  LABA1C -- --   Lipid Panel No results found for this basename: CHOL,TRIG,HDL,CHOLHDL,VLDL,LDLCALC in the last 72 hours  Studies/Results: No results found.  Medications:  Scheduled Meds:   . cefTRIAXone (ROCEPHIN)  IV  1 g Intravenous Q24H  . cyanocobalamin  1,000 mcg Intramuscular Daily  . enalapril  10 mg Oral BID  . furosemide  20 mg Oral Daily  . meperidine      . methylPREDNISolone (SOLU-MEDROL) injection  500 mg Intravenous Q24H  . metoprolol tartrate  25 mg Oral BID  . midazolam      . mineral oil      . pantoprazole  40 mg Oral QAC breakfast  . potassium chloride  40 mEq Oral Once  . sodium chloride  3 mL Intravenous Q12H  . DISCONTD: enalaprilat  0.625 mg Intravenous Q6H  . DISCONTD: metoprolol  5 mg  Intravenous Q8H  . DISCONTD: pantoprazole (PROTONIX) IV  40 mg Intravenous Q24H   Continuous Infusions:   . sodium chloride     PRN Meds:.acetaminophen, acetaminophen, albuterol, alum & mag hydroxide-simeth, hydrALAZINE, HYDROcodone-acetaminophen, LORazepam, magnesium hydroxide, ondansetron (ZOFRAN) IV, ondansetron, DISCONTD: meperidine, DISCONTD: midazolam, DISCONTD: simethicone susp in sterile water 1000 mL irrigation   EEG shows mild slowing otherwise unremarkable.  Assessment/Plan: 1. Multifactorial encephalopathy including toxic metabolic derangements and likely effect from meningioma with vasogenic edema. So far she has responded well to high-dose steroids. This would continue for another day and then she can be switched to oral prednisone 60 mg for couple weeks and subsequently weaned afterwards.   LOS: 4 days   Kraig Genis

## 2012-04-20 ENCOUNTER — Inpatient Hospital Stay
Admission: RE | Admit: 2012-04-20 | Discharge: 2012-05-27 | Disposition: A | Discharge status: Home / Self Care | Payer: PRIVATE HEALTH INSURANCE | Source: Ambulatory Visit | Attending: Internal Medicine | Admitting: Internal Medicine

## 2012-04-20 DIAGNOSIS — E538 Deficiency of other specified B group vitamins: Secondary | ICD-10-CM

## 2012-04-20 LAB — BASIC METABOLIC PANEL
BUN: 29 mg/dL — ABNORMAL HIGH (ref 6–23)
CO2: 23 mEq/L (ref 19–32)
Chloride: 107 mEq/L (ref 96–112)
GFR calc Af Amer: 87 mL/min — ABNORMAL LOW (ref >90)
Potassium: 3.6 mEq/L (ref 3.5–5.1)

## 2012-04-20 MED ORDER — FUROSEMIDE 20 MG PO TABS: 20.0000 mg | ORAL_TABLET | Freq: Every day | ORAL | Status: DC

## 2012-04-20 MED ORDER — PREDNISONE 20 MG PO TABS: 40.0000 mg | ORAL_TABLET | Freq: Every day | ORAL | Status: DC

## 2012-04-20 MED ORDER — METOPROLOL TARTRATE 25 MG PO TABS: 25.0000 mg | ORAL_TABLET | Freq: Two times a day (BID) | ORAL | Status: DC

## 2012-04-20 MED ORDER — SULFAMETHOXAZOLE-TMP DS 800-160 MG PO TABS: 0.5000 | ORAL_TABLET | Freq: Two times a day (BID) | ORAL | Status: DC

## 2012-04-20 MED ORDER — PANTOPRAZOLE SODIUM 40 MG PO TBEC: 40.0000 mg | DELAYED_RELEASE_TABLET | Freq: Every day | ORAL | Status: DC

## 2012-04-20 MED ORDER — SULFAMETHOXAZOLE-TRIMETHOPRIM 400-80 MG PO TABS: 1.0000 | ORAL_TABLET | Freq: Two times a day (BID) | ORAL | Status: DC

## 2012-04-20 MED ORDER — HYDROCODONE-ACETAMINOPHEN 5-325 MG PO TABS: 1.0000 | ORAL_TABLET | Freq: Four times a day (QID) | ORAL | Status: DC | PRN

## 2012-04-20 MED ORDER — ENALAPRIL MALEATE 10 MG PO TABS: 10.0000 mg | ORAL_TABLET | Freq: Two times a day (BID) | ORAL | Status: DC

## 2012-04-20 NOTE — Care Management Note (Signed)
    Page 1 of 1   04/20/2012     8:58:48 AM   CARE MANAGEMENT NOTE 04/20/2012  Patient:  Madison Hickman, Madison Hickman   Account Number:  [id number]  Date Initiated:  04/20/2012  Documentation initiated by:  Rosemary Holms  Subjective/Objective Assessment:   Pt to be placed in SNF.     Action/Plan:   Anticipated DC Date:  04/20/2012   Anticipated DC Plan:  SKILLED NURSING FACILITY  In-house referral  Clinical Social Worker      DC Planning Services  CM consult      Choice offered to / List presented to:             Status of service:  Completed, signed off Medicare Important Message given?   (If response is "NO", the following Medicare IM given date fields will be blank) Date Medicare IM given:   Date Additional Medicare IM given:    Discharge Disposition:  SKILLED NURSING FACILITY  Per UR Regulation:    If discussed at Long Length of Stay Meetings, dates discussed:    Comments:  04/20/12 Rosemary Holms RN BSN CM

## 2012-04-20 NOTE — Clinical Social Work Note (Signed)
Pt d/c today to SNF. CSW tried to discuss bed offers with pt but she was asleep. Pt's son accepts bed at [facility]. Pt was agreeable to this yesterday. D/C summary faxed. Pt to transfer with RN. Out of facility DNR in packet.   Derenda Fennel, [place] [phone]

## 2012-04-20 NOTE — Progress Notes (Signed)
Subjective: Interval History: The staff reports that the patient is somewhat more drowsy today. She is currently getting her Foley catheter attended to/change.  Objective: Vital signs in last 24 hours: Temp:  [97.7 F (36.5 C)-97.8 F (36.6 C)] 97.7 F (36.5 C) (10/16 0411) Pulse Rate:  [68-93] 93  (10/16 0411) Resp:  [17-23] 17  (10/16 0411) BP: (154-177)/(76-113) 154/76 mmHg (10/16 0411) SpO2:  [90 %-97 %] 90 % (10/16 0411) Weight:  [40.6 kg (89 lb 8.1 oz)] 40.6 kg (89 lb 8.1 oz) (10/16 0411)  Intake/Output from previous day: 10/15 0701 - 10/16 0700 In: 150 [P.O.:150] Out: 1400 [Urine:1400] Intake/Output this shift:   Nutritional status: Dysphagia  The patient appears to be in some discomfort while the catheter change. She is drowsy but only drives to light sternal rub. She does not follow commands. She stated that she is doing okay however. Pupils are 5 mm and reactive. She does resist eye opening. At the time. Corneal reflexes are intact. Facial muscle strength is symmetric. She has good strength bilaterally.  Lab Results:  Basename 04/20/12 0440 04/19/12 0432 04/18/12 0510  WBC -- 4.7 --  HGB -- 9.0* 9.3*  HCT -- 30.7* 31.3*  PLT -- 371 --  NA 143 144 --  K 3.6 3.4* --  CL 107 106 --  CO2 23 23 --  GLUCOSE 126* 124* --  BUN 29* 29* --  CREATININE 0.73 0.81 --  CALCIUM 11.4* 11.2* --  LABA1C -- -- --   Lipid Panel No results found for this basename: CHOL,TRIG,HDL,CHOLHDL,VLDL,LDLCALC in the last 72 hours  Studies/Results: Ct Abdomen Pelvis W Contrast  04/19/2012  *RADIOLOGY REPORT*  Clinical Data: Abdominal pain.  Confusion.  Iron deficiency anemia. Query colon mass.  CT ABDOMEN AND PELVIS WITH CONTRAST  Technique:  Multidetector CT imaging of the abdomen and pelvis was performed following the standard protocol during bolus administration of intravenous contrast.  Contrast: OMNIPAQUE IOHEXOL 300 MG/ML  SOLN  Comparison: 02/14/2012  Findings: Dextroconvex  lower thoracic and levoconvex lumbar scoliosis noted with rotary component.  Emphysema noted at the lung bases.  Cardiomegaly noted with mild pectus excavatum and ectatic lower thoracic aorta with mural thrombus.  Despite efforts by the patient and technologist, motion artifact is present on some series of today's examination and could not be totally eliminated.  This reduces diagnostic sensitivity and specificity.  1.1 cm hypodense lesion in the dome of segment seven on image 7 of series 2, technically nonspecific.  A 1.6 cm in diameter cyst is present in the segment eight.  Several additional small hypodense lesions are present scattered in the liver, technically too small to characterize.  Mild intrahepatic biliary dilatation noted.  The common bile duct is within normal limits for age.  Gallbladder obscured by motion artifact.  Spleen unremarkable.  Fullness of the left adrenal gland noted without well-defined mass.  Punctate calcification along the right inferior renal hilum, image 31 of series 2, favors vascular calcification over nonobstructive calculus.  Mild distal esophageal wall thickening is present. There is a paucity of intra-abdominal fat, leading to poor separation of adjacent structures which reduces diagnostic sensitivity and specificity.  Irregular speckled density and lucency in the right iliac bone, query prior graft harvesting, alternatively a chondroid lesion. Roughly similar appearance on 05/08/2008.  A Foley catheters present in the urinary bladder which is otherwise empty.  Oral contrast medium makes its way to the right colon.  No dilated small bowel.  IMPRESSION:  1.  Scoliosis.  2.  Emphysema. 3.  Cardiomegaly. 4.  Hypodense lesions in the liver appear to represent cysts or large enough to be characterized.  Smaller hepatic lesions are technically nonspecific although statistically likely to be cysts. 5.  Mild intrahepatic biliary dilatation, etiology uncertain. Common bile duct appears  within normal limits.  No Klatskin tumor observed. 6.  Mild circumferential wall thickening in the distal esophagus, query esophagitis. 7.  Speckled irregular density lucency in the right iliac bone. This could represent a chondroid lesion or prior graft harvesting site, correlate with the history of graft harvesting.  This lesion measures up to 3.8 cm in diameter, and accordingly may warrant observation, although coarse and trabeculae in this vicinity on prior lumbar spine radiographs of 2009 suggests that it is present at that time. 8.  Although I do not observe the colon mass, please note that non- virtual colonography CT is not a highly sensitive way to assess for various forms of colon malignancy.  Colonoscopy may be warranted.   Original Report Authenticated By: Dellia Cloud, M.D.     Medications:  Scheduled Meds:   . cyanocobalamin  1,000 mcg Intramuscular Daily  . enalapril  10 mg Oral BID  . furosemide  20 mg Oral Daily  . metoprolol tartrate  25 mg Oral BID  . pantoprazole  40 mg Oral QAC breakfast  . predniSONE  40 mg Oral Q breakfast  . sodium chloride  3 mL Intravenous Q12H  . sodium chloride      . sulfamethoxazole-trimethoprim  0.5 tablet Oral Q12H  . DISCONTD: cefTRIAXone (ROCEPHIN)  IV  1 g Intravenous Q24H  . DISCONTD: methylPREDNISolone (SOLU-MEDROL) injection  500 mg Intravenous Q24H  . DISCONTD: sulfamethoxazole-trimethoprim  1 tablet Oral Q12H   Continuous Infusions:   . DISCONTD: sodium chloride     PRN Meds:.acetaminophen, acetaminophen, albuterol, alum & mag hydroxide-simeth, hydrALAZINE, HYDROcodone-acetaminophen, iohexol, LORazepam, magnesium hydroxide, ondansetron (ZOFRAN) IV, ondansetron   Assessment/Plan:  Multifactorial encephalopathy including multiple toxic metabolic causes. Right frontal meningioma with extensive metastatic edema. She is status post IV Solu-Medrol and now has been placed on oral prednisone. I would continue this for a week or 2  and then taper off.    LOS: 5 days   Deavion Strider

## 2012-04-20 NOTE — Discharge Summary (Signed)
Physician Discharge Summary  Madison Hickman JYN:[id number] DOB: Nov 12, 1923 DOA: 04/15/2012    Admit date: 04/15/2012 Discharge date: 04/20/2012  Recommendations for  Follow-up:  1. Repeat urine culture in one week. 2. Monitor hemoglobin. May require intermittent blood transfusion depending on wishes of patient's son. 3. Consider hospice care in the future.   Discharge Diagnoses: 1. Right frontal meningioma with vasogenic edema and right to left shift. Not a surgical candidate. 2. Microcytic anemia with history of dysphagia, status post EGD which showed nonerosive gastritis. Colonoscopy not felt to be appropriate. 3. Escherichia coli UTI treated with intravenous ceftriaxone for 4 days, complete a further 5 days with Bactrim. 4. Hypertension, with improved control. 5. Hypercalcemia, mild, possibly related to occult malignancy, we'll need to keep monitored. 6. B12 deficiency, will require monthly B12 injections. 7. Diastolic congestive heart failure, ejection fraction 50-55%, clinically compensated. 8. Palliative performance score of 30-40 percent. Likely candidate for hospice services in the future.  Discharge Condition: Stable.  Diet recommendation: Dysphasia 3 (mechanical soft); thin liquid.  Filed Weights   04/18/12 0500 04/19/12 0500 04/20/12 0411  Weight: 45.8 kg (100 lb 15.5 oz) 40.5 kg (89 lb 4.6 oz) 40.6 kg (89 lb 8.1 oz)    History of present illness:  This 78 year old lady presents to the hospital with symptoms of chest pressure lasting for approximately 1 hour. Please see initial history as outlined below: Madison Hickman is an 78 y.o. female who presents to the ED with chest pressure lasting for about an hour. She was bowling close when it occurred. It is currently gone. She has no primary care physician. She has become progressively more short of breath with exertion over the past several weeks. Her legs have become more swollen over the past month. She has chest pain  occasionally in the past, but not this severe. She has never had a cardiac workup. Her family members have been encouraging her to see a physician for years, but she only sees Dr. Hilda Lias for her chronic back pain. She has a history of hypertension, but has not been on medications recently. In the emergency room, she was found to have a BNP of over 5000. Hemoglobin 7.1. Heme-negative on rectal exam. Chest x-ray consistent with mild pulmonary edema. Blood pressure initially was 189/85. Urinalysis consistent with urinary tract infection. She was started on a nitroglycerin drip, given Lasix, Rocephin, aspirin. Troponin normal. EKG shows unchanged lateral T-wave inversion. Denies fevers, cough, bleeding. No known endoscopy.  Hospital Course:  She was found to be anemic on admission and also clinically in congestive heart failure. This was treated with intravenous diuretics and she underwent EGD by gastroenterology, Dr. Darrick Penna. EGD showed nonerosive gastritis. During the EGD the patient also had esophageal dilatation performed. In view of her altered mental status, a CT head scan was done. This showed the presence of a right frontal meningioma with slight increase in size and increased vasogenic edema. She was seen by neurology, Dr [name], who prescribed for her 2 days of high dose intravenous steroids and has recommended oral steroids to be tapered off for approximately one month. She was reevaluated by physical therapy, who felt that in her deconditioned state, she would be more appropriate for skilled nursing facility rather than assisted living facility. Urine culture grew Escherichia coli which was being appropriately treated with intravenous ceftriaxone. Since the EGD was largely nonrevealing, a CT scan of the abdomen was done. This too did not give any cause of her microcytic anemia. In view  of her poor condition, it was felt not appropriate to perform colonoscopy. Dr. Darrick Penna felt that she would not tolerate  doing a bowel prep for this. Therefore I think she has achieved maximum benefit from hospitalization. I've spoken to the son at length regarding her overall condition and likely prognosis, which I believe is probably less than 6 months. I think that if she continues to decline, hospice care would be appropriate. The patient remains a DO NOT RESUSCITATE. Procedures:   EGD on 04/18/2012.   Echocardiogram as outlined below: [name], Corinna Cheri Kearns, Corinna ATTENDING [name] PERFORMING Delma Freeze [place] SONOGRAPHER Nestor Ramp cc:  ------------------------------------------------------------ LV EF: 50% - 55%  ------------------------------------------------------------ Indications: CHF - 428.0. Anemia  ------------------------------------------------------------ History: PMH: Dyspnea. Congestive heart failure.  ------------------------------------------------------------ Study Conclusions  - Left ventricle: Abnormal septal motion The cavity size was mildly dilated. Wall thickness was increased in a pattern of mild LVH. Systolic function was normal. The estimated ejection fraction was in the range of 50% to 55%. - Aortic valve: Mild regurgitation. - Mitral valve: Calcified annulus. Mild to moderate regurgitation. - Left atrium: The atrium was mildly dilated. - Pericardium, extracardiac: A trivial pericardial effusion was identified. Transthoracic echocardiography. M-mode, complete 2D, spectral Doppler, and color Doppler. Height: Height: 170.2cm. Height: 67in. Weight: Weight: 43.1kg. Weight: 94.8lb. Body mass index: BMI: 14.9kg/m^2. Body surface area: BSA: 1.53m^2. Patient status: Inpatient. Location: Bedside.   Consultations:   Neurology, Dr [name].  Gastroenterology, Dr. Darrick Penna.  Discharge Exam: Filed Vitals:   04/19/12 1010 04/19/12 1416 04/19/12 2053 04/20/12 0411  BP: 174/82 160/86 160/110 154/76  Pulse: 73 69 68 93  Temp: 97.7  F (36.5 C) 97.7 F (36.5 C) 97.8 F (36.6 C) 97.7 F (36.5 C)  TempSrc: Axillary Axillary Axillary Axillary  Resp: 22 20 18 17   Height:      Weight:    40.6 kg (89 lb 8.1 oz)  SpO2: 94% 90%  90%    General:  She looks very frail. She is contracted. She is bed bound. Cardiovascular:  Heart sounds are present and normal without murmurs. There is no clinical evidence of heart failure at the present time. Respiratory:  Lung fields are clear. She is alert intermittently. There are no obvious focal neurological signs.  Discharge Instructions  Discharge Orders    Future Orders Please Complete By Expires   Diet - low sodium heart healthy      Increase activity slowly          Medication List     As of 04/20/2012  8:06 AM    STOP taking these medications         ibuprofen 200 MG tablet   Commonly known as: ADVIL,MOTRIN      polyethylene glycol 236 G solution   Commonly known as: GoLYTELY/NuLYTELY      traMADol 100 MG 24 hr tablet   Commonly known as: ULTRAM-ER      UNKNOWN TO PATIENT      TAKE these medications         enalapril 10 MG tablet   Commonly known as: VASOTEC   Take 1 tablet (10 mg total) by mouth 2 (two) times daily.      furosemide 20 MG tablet   Commonly known as: LASIX   Take 1 tablet (20 mg total) by mouth daily.      HYDROcodone-acetaminophen 5-325 MG per tablet   Commonly known as: NORCO/VICODIN   Take 1 tablet by mouth every 6 (six) hours as  needed.      metoprolol tartrate 25 MG tablet   Commonly known as: LOPRESSOR   Take 1 tablet (25 mg total) by mouth 2 (two) times daily.      pantoprazole 40 MG tablet   Commonly known as: PROTONIX   Take 1 tablet (40 mg total) by mouth daily before breakfast.      predniSONE 20 MG tablet   Commonly known as: DELTASONE   Take 2 tablets (40 mg total) by mouth daily with breakfast.      sulfamethoxazole-trimethoprim 400-80 MG per tablet   Commonly known as: BACTRIM,SEPTRA   Take 1 tablet by mouth  every 12 (twelve) hours.          The results of significant diagnostics from this hospitalization (including imaging, microbiology, ancillary and laboratory) are listed below for reference.    Significant Diagnostic Studies: Ct Head Wo Contrast  04/17/2012  *RADIOLOGY REPORT*  Clinical Data: Headache.  Confusion.  Meningioma.  CT HEAD WITHOUT CONTRAST  Technique:  Contiguous axial images were obtained from the base of the skull through the vertex without contrast.  Comparison: 05/08/2008  Findings: Partially calcified meningioma in the right frontal region shows mild increase in size, now measuring approximately 0.5 x 3.4 cm.  There is increased associated vasogenic edema in the high right frontal lobe, with increased mass effect on the right frontal horn.  Mild right to left midline shift shows mild increase in the frontal region measuring approximately 10 mm.  No evidence of intracranial hemorrhage, acute cerebral infarction or abnormal extra-axial fluid collections.  No evidence of hydrocephalus.  IMPRESSION:  Mild increase in size of right frontal meningioma, with increased vasogenic edema in the high right frontal lobe.  Mild increase in mass effect and right to left midline shift.   Original Report Authenticated By: Danae Orleans, M.D.    Ct Abdomen Pelvis W Contrast  04/19/2012  *RADIOLOGY REPORT*  Clinical Data: Abdominal pain.  Confusion.  Iron deficiency anemia. Query colon mass.  CT ABDOMEN AND PELVIS WITH CONTRAST  Technique:  Multidetector CT imaging of the abdomen and pelvis was performed following the standard protocol during bolus administration of intravenous contrast.  Contrast: OMNIPAQUE IOHEXOL 300 MG/ML  SOLN  Comparison: 02/14/2012  Findings: Dextroconvex lower thoracic and levoconvex lumbar scoliosis noted with rotary component.  Emphysema noted at the lung bases.  Cardiomegaly noted with mild pectus excavatum and ectatic lower thoracic aorta with mural thrombus.   Despite efforts by the patient and technologist, motion artifact is present on some series of today's examination and could not be totally eliminated.  This reduces diagnostic sensitivity and specificity.  1.1 cm hypodense lesion in the dome of segment seven on image 7 of series 2, technically nonspecific.  A 1.6 cm in diameter cyst is present in the segment eight.  Several additional small hypodense lesions are present scattered in the liver, technically too small to characterize.  Mild intrahepatic biliary dilatation noted.  The common bile duct is within normal limits for age.  Gallbladder obscured by motion artifact.  Spleen unremarkable.  Fullness of the left adrenal gland noted without well-defined mass.  Punctate calcification along the right inferior renal hilum, image 31 of series 2, favors vascular calcification over nonobstructive calculus.  Mild distal esophageal wall thickening is present. There is a paucity of intra-abdominal fat, leading to poor separation of adjacent structures which reduces diagnostic sensitivity and specificity.  Irregular speckled density and lucency in the right iliac bone, query prior  graft harvesting, alternatively a chondroid lesion. Roughly similar appearance on 05/08/2008.  A Foley catheters present in the urinary bladder which is otherwise empty.  Oral contrast medium makes its way to the right colon.  No dilated small bowel.  IMPRESSION:  1.  Scoliosis. 2.  Emphysema. 3.  Cardiomegaly. 4.  Hypodense lesions in the liver appear to represent cysts or large enough to be characterized.  Smaller hepatic lesions are technically nonspecific although statistically likely to be cysts. 5.  Mild intrahepatic biliary dilatation, etiology uncertain. Common bile duct appears within normal limits.  No Klatskin tumor observed. 6.  Mild circumferential wall thickening in the distal esophagus, query esophagitis. 7.  Speckled irregular density lucency in the right iliac bone. This could  represent a chondroid lesion or prior graft harvesting site, correlate with the history of graft harvesting.  This lesion measures up to 3.8 cm in diameter, and accordingly may warrant observation, although coarse and trabeculae in this vicinity on prior lumbar spine radiographs of 2009 suggests that it is present at that time. 8.  Although I do not observe the colon mass, please note that non- virtual colonography CT is not a highly sensitive way to assess for various forms of colon malignancy.  Colonoscopy may be warranted.   Original Report Authenticated By: Dellia Cloud, M.D.    US Venous Img Lower Bilateral  04/15/2012  *RADIOLOGY REPORT*  Clinical Data: Bilateral leg pain with edema.  VENOUS DUPLEX ULTRASOUND OF BILATERAL LOWER EXTREMITIES  Technique:  Gray-scale sonography with graded compression, as well as color Doppler and duplex ultrasound, were performed to evaluate the deep venous system of both lower extremities from the level of the common femoral vein through the popliteal and proximal calf veins.  Spectral Doppler was utilized to evaluate flow at rest and with distal augmentation maneuvers.  Comparison:  None.  Findings: From the level of the common femoral vein to the popliteal vein, there is adequate color flow, compression and augmentation without evidence of a lower extremity deep venous thrombosis on either side.  Limited imaging of calf veins and superficial veins without thrombus.  Bilateral lower extremity edema.  IMPRESSION: No evidence of lower extremity deep venous thrombosis.  This is a call report.   Original Report Authenticated By: Fuller Canada, M.D.    Dg Chest Portable 1 View  04/15/2012  *RADIOLOGY REPORT*  Clinical Data: Shortness of breath  PORTABLE CHEST - 1 VIEW  Comparison: Prior chest x-ray 11/19/2011  Findings: Interval development of a increased pulmonary vascular congestion, cephalization and indistinctness of the interstitial markings suggesting mild  interstitial edema.  Marked enlargement of the cardiopericardial silhouette is similar to prior.  Enlarged main central pulmonary arteries.  Ectatic, tortuous and atherosclerotic thoracic aorta.  Developing linear opacities in the bilateral bases.  Background changes of hyperinflation and chronic coarsening of the interstitial markings persist consistent with underlying COPD/emphysema.  No acute osseous abnormality.  Bones appear osteopenic.  IMPRESSION:  1.  Mild interstitial edema and unchanged cardiomegaly suggest mild CHF. 2.  Background changes of underlying COPD/emphysema persist 3.  Linear opacities the bilateral bases most consistent with subsegmental atelectasis 4.  Ectatic, atherosclerotic and highly tortuous thoracic aorta   Original Report Authenticated By: Sterling Big, M.D.     Microbiology: Recent Results (from the past 240 hour(s))  URINE CULTURE     Status: Normal   Collection Time   04/15/12  8:30 AM      Component Value Range Status Comment  Specimen Description URINE, CATHETERIZED   Final    Special Requests NONE   Final    Culture  Setup Time 04/16/2012 01:43   Final    Colony Count >=100,000 COLONIES/ML   Final    Culture ESCHERICHIA COLI   Final    Report Status 04/19/2012 FINAL   Final    Organism ID, Bacteria ESCHERICHIA COLI   Final   MRSA PCR SCREENING     Status: Normal   Collection Time   04/15/12 12:53 PM      Component Value Range Status Comment   MRSA by PCR NEGATIVE  NEGATIVE Final      Labs: Basic Metabolic Panel:  Lab 04/20/12 9811 04/19/12 0432 04/18/12 0510 04/17/12 0455 04/16/12 0451 04/15/12 1039  NA 143 144 141 137 143 --  K 3.6 3.4* 3.3* 3.0* 4.3 --  CL 107 106 105 101 109 --  CO2 23 23 23 26 26  --  GLUCOSE 126* 124* 108* 161* 85 --  BUN 29* 29* 17 11 14  --  CREATININE 0.73 0.81 1.00 0.75 0.80 --  CALCIUM 11.4* 11.2* 11.2* 10.5 10.1 --  MG -- -- -- -- -- 2.2  PHOS -- -- -- -- -- --   Liver Function Tests:  Lab 04/19/12 0432  04/15/12 1039  AST 24 20  ALT 12 10  ALKPHOS 68 56  BILITOT 0.5 0.4  PROT 7.4 5.7*  ALBUMIN 3.7 2.9*     CBC:  Lab 04/19/12 0432 04/18/12 0510 04/17/12 0455 04/16/12 0451 04/15/12 0726  WBC 4.7 -- -- 5.7 4.6  NEUTROABS -- -- -- -- 2.8  HGB 9.0* 9.3* 7.7* 7.6* 7.1*  HCT 30.7* 31.3* 26.2* 26.2* 23.6*  MCV 74.7* -- -- 76.4* 75.2*  PLT 371 -- -- 301 285   Cardiac Enzymes:  Lab 04/15/12 1736 04/15/12 1231 04/15/12 0726  CKTOTAL -- -- --  CKMB -- -- --  CKMBINDEX -- -- --  TROPONINI <0.30 <0.30 <0.30   BNP: BNP (last 3 results)  Basename 04/15/12 0726  PROBNP 5133.0*      Time coordinating discharge: *Greater than  Signed:  Layten Aiken C  [facility] 04/20/2012, 8:06 AM

## 2012-04-20 NOTE — Clinical Social Work Placement (Signed)
Clinical Social Work Department CLINICAL SOCIAL WORK PLACEMENT NOTE 04/20/2012  Patient:  Madison Hickman, Madison Hickman  Account Number:  [id number] Admit date:  04/15/2012  Clinical Social Worker:  Derenda Fennel, LCSW  Date/time:  04/19/2012 02:10 PM  Clinical Social Work is seeking post-discharge placement for this patient at the following level of care:   SKILLED NURSING   (*CSW will update this form in Epic as items are completed)   04/19/2012  Patient/family provided with [facility] Department of Clinical Social Work's list of facilities offering this level of care within the geographic area requested by the patient (or if unable, by the patient's family).  04/19/2012  Patient/family informed of their freedom to choose among providers that offer the needed level of care, that participate in Medicare, Medicaid or managed care program needed by the patient, have an available bed and are willing to accept the patient.  04/19/2012  Patient/family informed of [facility]' ownership interest in [facility], as well as of the fact that they are under no obligation to receive care at this facility.  PASARR submitted to EDS on 04/19/2012 PASARR number received from [employer] on 04/19/2012  FL2 transmitted to all facilities in geographic area requested by pt/family on  04/19/2012 FL2 transmitted to all facilities within larger geographic area on   Patient informed that his/her managed care company has contracts with or will negotiate with  certain facilities, including the following:     Patient/family informed of bed offers received:  04/20/2012 Patient chooses bed at [facility] Physician recommends and patient chooses bed at  [facility]  Patient to be transferred to [facility] on  04/20/2012 Patient to be transferred to facility by RN  The following physician request were entered in Epic:   Additional Comments:  Derenda Fennel, LCSW [phone]

## 2012-04-21 ENCOUNTER — Telehealth: Payer: Self-pay | Admitting: Gastroenterology

## 2012-04-21 NOTE — Telephone Encounter (Signed)
PLEASE CALL PT'S FACILITY AND NOTIFY SON.  Her stomach Bx showed H. Pylori infection. She needs AMOXICILLIN 500 mg 2 po BID for 10 days and Biaxin 500 mg po bid for 10 days, #qs, rfx0. She needs PROTONIX 40 mg BID for 10 days then 1 po QD. Med side effects include NVD, abd pain, and metallic taste.

## 2012-04-21 NOTE — Telephone Encounter (Signed)
Path faxed to PCP 

## 2012-04-21 NOTE — Telephone Encounter (Signed)
Chip Boer at the [facility] is aware of the new orders and the results. She will let the son know about the new orders.

## 2012-04-21 NOTE — Progress Notes (Signed)
REVIEWED.  

## 2012-04-21 NOTE — Telephone Encounter (Signed)
REVIEWED MED LIST WITH PHARMACY.

## 2012-05-25 LAB — GLUCOSE, CAPILLARY: Glucose-Capillary: 74 mg/dL (ref 70–99)

## 2012-06-21 ENCOUNTER — Encounter: Payer: Self-pay | Admitting: Family Medicine

## 2012-06-21 ENCOUNTER — Ambulatory Visit (INDEPENDENT_AMBULATORY_CARE_PROVIDER_SITE_OTHER): Payer: Medicare Other | Admitting: Family Medicine

## 2012-06-21 VITALS — BP 132/80 | HR 67 | Resp 18 | Ht 60.0 in | Wt 105.0 lb

## 2012-06-21 DIAGNOSIS — I10 Essential (primary) hypertension: Secondary | ICD-10-CM

## 2012-06-21 DIAGNOSIS — R609 Edema, unspecified: Secondary | ICD-10-CM

## 2012-06-21 DIAGNOSIS — E46 Unspecified protein-calorie malnutrition: Secondary | ICD-10-CM

## 2012-06-21 DIAGNOSIS — D329 Benign neoplasm of meninges, unspecified: Secondary | ICD-10-CM

## 2012-06-21 DIAGNOSIS — E538 Deficiency of other specified B group vitamins: Secondary | ICD-10-CM

## 2012-06-21 DIAGNOSIS — I509 Heart failure, unspecified: Secondary | ICD-10-CM

## 2012-06-21 DIAGNOSIS — D320 Benign neoplasm of cerebral meninges: Secondary | ICD-10-CM

## 2012-06-21 DIAGNOSIS — D509 Iron deficiency anemia, unspecified: Secondary | ICD-10-CM

## 2012-06-21 LAB — CBC
Hemoglobin: 11 g/dL — ABNORMAL LOW (ref 12.0–15.0)
MCH: 26.6 pg (ref 26.0–34.0)
MCV: 86 fL (ref 78.0–100.0)
RBC: 4.13 MIL/uL (ref 3.87–5.11)

## 2012-06-21 LAB — BASIC METABOLIC PANEL
Chloride: 111 mEq/L (ref 96–112)
Potassium: 3.6 mEq/L (ref 3.5–5.3)

## 2012-06-21 MED ORDER — METOPROLOL TARTRATE 25 MG PO TABS: 25.0000 mg | ORAL_TABLET | Freq: Two times a day (BID) | ORAL | Status: DC

## 2012-06-21 MED ORDER — ENALAPRIL MALEATE 10 MG PO TABS: 10.0000 mg | ORAL_TABLET | Freq: Two times a day (BID) | ORAL | Status: DC

## 2012-06-21 MED ORDER — PANTOPRAZOLE SODIUM 40 MG PO TBEC: 40.0000 mg | DELAYED_RELEASE_TABLET | Freq: Every day | ORAL | Status: DC

## 2012-06-21 MED ORDER — HYDROCODONE-ACETAMINOPHEN 5-325 MG PO TABS: 1.0000 | ORAL_TABLET | Freq: Four times a day (QID) | ORAL | Status: DC | PRN

## 2012-06-21 MED ORDER — FERROUS SULFATE 325 (65 FE) MG PO TABS: 325.0000 mg | ORAL_TABLET | Freq: Every day | ORAL | Status: DC

## 2012-06-21 MED ORDER — FUROSEMIDE 20 MG PO TABS: 20.0000 mg | ORAL_TABLET | Freq: Every day | ORAL | Status: DC

## 2012-06-21 NOTE — Patient Instructions (Addendum)
Restart lasix TED hose for the leg swelling Continue blood pressure medications Pain medication refilled  Get the labs done F/U 2 months

## 2012-06-21 NOTE — Progress Notes (Signed)
  Subjective:    Patient ID: Madison Hickman, female    DOB: 09-Dec-1923, 78 y.o.   MRN: [id number]  HPI   Patient here to establish care. She's not had a primary care provider this year. She was seen by Dr. Fransico Him for a few visits before he establish as a specialist for endocrinology only. She was also followed by Dr. Hilda Lias orthopedic surgeon secondary to chronic back pain and was prescribed narcotic medication. She was recently admitted secondary to severe anemia thought to be due to gastritis and H. pylori. She's also known to have frontal meningioma which was treated with IV steroids and prednisone taper. She was treated for urinary tract infection in the hospital as well as uncontrolled blood pressure. Per the discharge summary she's not a surgical candidate regarding her frontal meningioma and she has had some vasogenic edema with mass shift. Her palliative care performance score was very low at 30% and she was given a poor prognosis. [facility] and now resides at home with her son. She states that she is doing well. She is working with physical therapy and is up and about. She has gained 5 pounds since discharge from the hospital. She has a fair appetite. She denies any headache but does have chronic low back pain which she requires a pain pill at least once a day. There were about her leg swelling which has worsened since she has been discharge without her Lasix. She also has history of diastolic dysfunction. Medications and history reviewed POA- Son Madison Hickman Lives with Madison Hickman  Review of Systems  GEN- denies fatigue, fever, weight loss,weakness, recent illness HEENT- denies eye drainage, change in vision, nasal discharge, CVS- denies chest pain, palpitations, +leg swelling  RESP- denies SOB, cough, wheeze ABD- denies N/V, change in stools, abd pain GU- denies dysuria, hematuria, dribbling, incontinence MSK- + joint pain, muscle aches,  injury Neuro- denies headache, dizziness, syncope, seizure activity      Objective:   Physical Exam GEN- NAD, alert and oriented x3, cachetic appearing , yellowed tint to skin HEENT- PERRL, EOMI, non injected sclera, pink conjunctiva, MMM, oropharynx clear Neck- Supple, no JVD CVS- RRR, no murmur RESP-CTAB ABS-NABS,soft,NT,ND EXT- 2+ pitting edema edema Pulses- Radial, DP- 2+ Psych- normal affect and mood        Assessment & Plan:

## 2012-06-22 DIAGNOSIS — E46 Unspecified protein-calorie malnutrition: Secondary | ICD-10-CM | POA: Insufficient documentation

## 2012-06-22 NOTE — Assessment & Plan Note (Signed)
Protein malnutrition, liberal diet at this time to help gain weight,

## 2012-06-22 NOTE — Assessment & Plan Note (Signed)
Check CBC, on iron tabs , Hb came back at 11, no change to regimen

## 2012-06-22 NOTE — Assessment & Plan Note (Signed)
She has not had B12 injections since hospital admission, will recheck level and decide on appropriate therapy

## 2012-06-22 NOTE — Assessment & Plan Note (Signed)
Blood pressure a little elevated however compared to hospital admission looks good, lasix addition will also help with the mild volume overload

## 2012-06-22 NOTE — Assessment & Plan Note (Signed)
Reviewed echo, diastolic dysfunction, worsening leg edema, restart lasix 20mg , unsure why this was not included on discharge, check metabolic panel

## 2012-06-22 NOTE — Assessment & Plan Note (Signed)
TED hose to be ordered, I do not think she will tolerate compression hose

## 2012-06-22 NOTE — Assessment & Plan Note (Signed)
No active treatment, poor prognosis, pt understands no cure for brain tumor, no behavioral changes at this time

## 2012-06-24 ENCOUNTER — Ambulatory Visit: Payer: Medicare Other | Admitting: Family Medicine

## 2012-08-09 ENCOUNTER — Telehealth: Payer: Self-pay | Admitting: Family Medicine

## 2012-08-09 MED ORDER — HYDROCODONE-ACETAMINOPHEN 5-325 MG PO TABS: 1.0000 | ORAL_TABLET | Freq: Four times a day (QID) | ORAL | Status: DC | PRN

## 2012-08-09 NOTE — Telephone Encounter (Signed)
Med printed to be signed  

## 2012-08-14 ENCOUNTER — Emergency Department (HOSPITAL_COMMUNITY): Payer: Medicare Other

## 2012-08-14 ENCOUNTER — Other Ambulatory Visit: Payer: Self-pay

## 2012-08-14 ENCOUNTER — Inpatient Hospital Stay (HOSPITAL_COMMUNITY)
Admission: EM | Admit: 2012-08-14 | Discharge: 2012-08-17 | DRG: 689 | Disposition: A | Discharge status: Skilled Nursing Facility | Payer: Medicare Other | Attending: Internal Medicine | Admitting: Internal Medicine

## 2012-08-14 ENCOUNTER — Encounter (HOSPITAL_COMMUNITY): Payer: Self-pay | Admitting: Internal Medicine

## 2012-08-14 DIAGNOSIS — E46 Unspecified protein-calorie malnutrition: Secondary | ICD-10-CM

## 2012-08-14 DIAGNOSIS — R609 Edema, unspecified: Secondary | ICD-10-CM

## 2012-08-14 DIAGNOSIS — D509 Iron deficiency anemia, unspecified: Secondary | ICD-10-CM

## 2012-08-14 DIAGNOSIS — D320 Benign neoplasm of cerebral meninges: Secondary | ICD-10-CM

## 2012-08-14 DIAGNOSIS — R079 Chest pain, unspecified: Secondary | ICD-10-CM

## 2012-08-14 DIAGNOSIS — C700 Malignant neoplasm of cerebral meninges: Secondary | ICD-10-CM | POA: Diagnosis present

## 2012-08-14 DIAGNOSIS — N390 Urinary tract infection, site not specified: Principal | ICD-10-CM

## 2012-08-14 DIAGNOSIS — I10 Essential (primary) hypertension: Secondary | ICD-10-CM

## 2012-08-14 DIAGNOSIS — R1310 Dysphagia, unspecified: Secondary | ICD-10-CM

## 2012-08-14 DIAGNOSIS — G936 Cerebral edema: Secondary | ICD-10-CM

## 2012-08-14 DIAGNOSIS — R404 Transient alteration of awareness: Secondary | ICD-10-CM | POA: Diagnosis present

## 2012-08-14 DIAGNOSIS — I5032 Chronic diastolic (congestive) heart failure: Secondary | ICD-10-CM

## 2012-08-14 DIAGNOSIS — E43 Unspecified severe protein-calorie malnutrition: Secondary | ICD-10-CM | POA: Diagnosis present

## 2012-08-14 DIAGNOSIS — D329 Benign neoplasm of meninges, unspecified: Secondary | ICD-10-CM

## 2012-08-14 DIAGNOSIS — R55 Syncope and collapse: Secondary | ICD-10-CM

## 2012-08-14 DIAGNOSIS — R51 Headache: Secondary | ICD-10-CM | POA: Diagnosis present

## 2012-08-14 DIAGNOSIS — Z79899 Other long term (current) drug therapy: Secondary | ICD-10-CM

## 2012-08-14 DIAGNOSIS — A498 Other bacterial infections of unspecified site: Secondary | ICD-10-CM | POA: Diagnosis present

## 2012-08-14 DIAGNOSIS — R519 Headache, unspecified: Secondary | ICD-10-CM | POA: Diagnosis present

## 2012-08-14 DIAGNOSIS — R627 Adult failure to thrive: Secondary | ICD-10-CM

## 2012-08-14 DIAGNOSIS — G8929 Other chronic pain: Secondary | ICD-10-CM

## 2012-08-14 DIAGNOSIS — E860 Dehydration: Secondary | ICD-10-CM | POA: Diagnosis present

## 2012-08-14 DIAGNOSIS — Z66 Do not resuscitate: Secondary | ICD-10-CM | POA: Diagnosis present

## 2012-08-14 DIAGNOSIS — M549 Dorsalgia, unspecified: Secondary | ICD-10-CM | POA: Diagnosis present

## 2012-08-14 DIAGNOSIS — M129 Arthropathy, unspecified: Secondary | ICD-10-CM | POA: Diagnosis present

## 2012-08-14 DIAGNOSIS — R410 Disorientation, unspecified: Secondary | ICD-10-CM

## 2012-08-14 DIAGNOSIS — R600 Localized edema: Secondary | ICD-10-CM

## 2012-08-14 DIAGNOSIS — IMO0002 Reserved for concepts with insufficient information to code with codable children: Secondary | ICD-10-CM

## 2012-08-14 DIAGNOSIS — I509 Heart failure, unspecified: Secondary | ICD-10-CM | POA: Diagnosis present

## 2012-08-14 DIAGNOSIS — E876 Hypokalemia: Secondary | ICD-10-CM

## 2012-08-14 DIAGNOSIS — E538 Deficiency of other specified B group vitamins: Secondary | ICD-10-CM

## 2012-08-14 DIAGNOSIS — E611 Iron deficiency: Secondary | ICD-10-CM

## 2012-08-14 LAB — COMPREHENSIVE METABOLIC PANEL
ALT: 7 U/L (ref 0–35)
Albumin: 3.5 g/dL (ref 3.5–5.2)
Alkaline Phosphatase: 57 U/L (ref 39–117)
BUN: 18 mg/dL (ref 6–23)
Calcium: 11.1 mg/dL — ABNORMAL HIGH (ref 8.4–10.5)
GFR calc Af Amer: 88 mL/min — ABNORMAL LOW (ref >90)
Glucose, Bld: 92 mg/dL (ref 70–99)
Potassium: 3 mEq/L — ABNORMAL LOW (ref 3.5–5.1)
Sodium: 143 mEq/L (ref 135–145)
Total Protein: 6.3 g/dL (ref 6.0–8.3)

## 2012-08-14 LAB — CBC WITH DIFFERENTIAL/PLATELET
Basophils Relative: 1 % (ref 0–1)
Eosinophils Absolute: 0.3 10*3/uL (ref 0.0–0.7)
Eosinophils Relative: 6 % — ABNORMAL HIGH (ref 0–5)
MCH: 29.5 pg (ref 26.0–34.0)
MCHC: 32.2 g/dL (ref 30.0–36.0)
MCV: 91.7 fL (ref 78.0–100.0)
Neutrophils Relative %: 58 % (ref 43–77)
Platelets: 271 10*3/uL (ref 150–400)

## 2012-08-14 LAB — URINALYSIS, ROUTINE W REFLEX MICROSCOPIC
Leukocytes, UA: NEGATIVE
Nitrite: POSITIVE — AB
Protein, ur: NEGATIVE mg/dL
Specific Gravity, Urine: 1.015 (ref 1.005–1.030)
Urobilinogen, UA: 0.2 mg/dL (ref 0.0–1.0)

## 2012-08-14 LAB — URINE MICROSCOPIC-ADD ON

## 2012-08-14 LAB — TROPONIN I: Troponin I: <0.3 ng/mL (ref <0.30)

## 2012-08-14 MED ORDER — DEXAMETHASONE SODIUM PHOSPHATE 4 MG/ML IJ SOLN
4.0000 mg | Freq: Four times a day (QID) | INTRAMUSCULAR | Status: DC
  Administered 2012-08-14 – 2012-08-15 (×2): 4 mg via INTRAVENOUS
  Filled 2012-08-14 (×2): qty 1

## 2012-08-14 MED ORDER — POTASSIUM CHLORIDE CRYS ER 20 MEQ PO TBCR
40.0000 meq | EXTENDED_RELEASE_TABLET | Freq: Once | ORAL | Status: AC
  Administered 2012-08-14: 40 meq via ORAL
  Filled 2012-08-14: qty 2

## 2012-08-14 MED ORDER — NITROGLYCERIN 0.4 MG SL SUBL
SUBLINGUAL_TABLET | SUBLINGUAL | Status: AC
  Administered 2012-08-14: 20:00:00
  Filled 2012-08-14: qty 25

## 2012-08-14 MED ORDER — HYDROCODONE-ACETAMINOPHEN 5-325 MG PO TABS
1.0000 | ORAL_TABLET | Freq: Once | ORAL | Status: AC
  Administered 2012-08-14: 1 via ORAL
  Filled 2012-08-14: qty 1

## 2012-08-14 MED ORDER — HYDROMORPHONE HCL PF 1 MG/ML IJ SOLN: 0.5000 mg | INTRAMUSCULAR | Status: DC | PRN

## 2012-08-14 MED ORDER — ONDANSETRON HCL 4 MG PO TABS: 4.0000 mg | ORAL_TABLET | Freq: Four times a day (QID) | ORAL | Status: DC | PRN

## 2012-08-14 MED ORDER — TRAZODONE HCL 50 MG PO TABS: 25.0000 mg | ORAL_TABLET | Freq: Every evening | ORAL | Status: DC | PRN

## 2012-08-14 MED ORDER — SORBITOL 70 % SOLN: 30.0000 mL | Freq: Every day | Status: DC | PRN

## 2012-08-14 MED ORDER — NITROGLYCERIN 0.4 MG SL SUBL
SUBLINGUAL_TABLET | SUBLINGUAL | Status: AC
  Filled 2012-08-14: qty 25

## 2012-08-14 MED ORDER — POTASSIUM CHLORIDE IN NACL 20-0.9 MEQ/L-% IV SOLN
INTRAVENOUS | Status: DC
  Administered 2012-08-14 – 2012-08-15 (×2): via INTRAVENOUS

## 2012-08-14 MED ORDER — ACETAMINOPHEN 325 MG PO TABS
650.0000 mg | ORAL_TABLET | Freq: Four times a day (QID) | ORAL | Status: DC | PRN
  Administered 2012-08-17: 650 mg via ORAL
  Filled 2012-08-14 (×2): qty 2

## 2012-08-14 MED ORDER — CEPHALEXIN 500 MG PO CAPS
500.0000 mg | ORAL_CAPSULE | Freq: Once | ORAL | Status: AC
  Administered 2012-08-14: 500 mg via ORAL
  Filled 2012-08-14: qty 1

## 2012-08-14 MED ORDER — HYDROCODONE-ACETAMINOPHEN 5-325 MG PO TABS
1.0000 | ORAL_TABLET | Freq: Four times a day (QID) | ORAL | Status: DC | PRN
  Administered 2012-08-15 – 2012-08-17 (×6): 1 via ORAL
  Filled 2012-08-14 (×7): qty 1

## 2012-08-14 MED ORDER — POLYETHYLENE GLYCOL 3350 17 G PO PACK: 17.0000 g | PACK | Freq: Every day | ORAL | Status: DC | PRN

## 2012-08-14 MED ORDER — PANTOPRAZOLE SODIUM 40 MG PO TBEC
40.0000 mg | DELAYED_RELEASE_TABLET | Freq: Every day | ORAL | Status: DC
  Administered 2012-08-15 – 2012-08-17 (×3): 40 mg via ORAL
  Filled 2012-08-14 (×3): qty 1

## 2012-08-14 MED ORDER — FLEET ENEMA 7-19 GM/118ML RE ENEM: 1.0000 | ENEMA | Freq: Once | RECTAL | Status: AC | PRN

## 2012-08-14 MED ORDER — VITAMIN B-12 1000 MCG PO TABS
1000.0000 ug | ORAL_TABLET | Freq: Every day | ORAL | Status: DC
  Administered 2012-08-15 – 2012-08-16 (×2): 1000 ug via ORAL
  Filled 2012-08-14 (×2): qty 1

## 2012-08-14 MED ORDER — ONDANSETRON HCL 4 MG/2ML IJ SOLN: 4.0000 mg | Freq: Four times a day (QID) | INTRAMUSCULAR | Status: DC | PRN

## 2012-08-14 MED ORDER — ACETAMINOPHEN 650 MG RE SUPP: 650.0000 mg | Freq: Four times a day (QID) | RECTAL | Status: DC | PRN

## 2012-08-14 MED ORDER — SODIUM CHLORIDE 0.9 % IV SOLN
Freq: Once | INTRAVENOUS | Status: AC
Start: 2012-08-14 — End: 2012-08-14
  Administered 2012-08-14: 19:00:00 via INTRAVENOUS

## 2012-08-14 NOTE — H&P (Signed)
[facility] History and Physical  Madison Hickman  QMV:[id number]  DOB: 1923/11/22   DOA: 08/14/2012   PCP:   Milinda Antis, MD   Chief Complaint:    HPI: Madison Hickman is an 79 y.o. female.   Elderly Caucasian lady Found to have a large meningioma with mass effect and vasogenic edema some months ago; was not thickened it for surgery, has a poor prognosis and it was felt would like would need hospice care in the future. Patient was discharged to skilled nursing facility but has been back home for some weeks now, and has been in steady decline. She lives with her son who is mentally ill and not really able to care for her. Because of her progressive decline, and because she sustained a fall at home,her older son Madison Hickman brought her to the hospital this evening for evaluation because he felt she was unsafe at home.  The patient was discussed with her son Madison Hickman, and his primary concern is that she should eat place where she can be cared for, she is DO NOT RESUSCITATE, and he feels she would benefit from hospice care. Her home situation is untenable. The patient is admitted for supportive care, social work evaluation, and hospice evaluation.  There is no history of fever chest pains. There is no behavioral disorder, but she does complain of increasing urge incontinence. She has no incontinence of bowel. She had an episode of syncope and collapse on the way to the bathroom today;  Rewiew of Systems:   Unable to complete a complete review of systems because patient is very weak and is often distracted, although at other times she seems very alert and appropriate  Past Medical History  Diagnosis Date  . Back pain, chronic   . Headache   . Hypertension   . Arthritis   . Meningioma 04/15/2012  . IDA (iron deficiency anemia)   . Diastolic dysfunction Oct 2013    EF 50-55%   . Hypercalcemia     Past Surgical History  Procedure Laterality Date  . Abdominal hysterectomy    . Arm surgery       Medications:  HOME MEDS: Prior to Admission medications   Medication Sig Start Date End Date Taking? Authorizing Provider  enalapril (VASOTEC) 10 MG tablet Take 1 tablet (10 mg total) by mouth 2 (two) times daily. 06/21/12  Yes Salley Scarlet, MD  ferrous sulfate 325 (65 FE) MG tablet Take 1 tablet (325 mg total) by mouth daily with breakfast. 06/21/12  Yes Salley Scarlet, MD  furosemide (LASIX) 20 MG tablet Take 1 tablet (20 mg total) by mouth daily. 06/21/12 06/21/13 Yes Salley Scarlet, MD  HYDROcodone-acetaminophen (NORCO/VICODIN) 5-325 MG per tablet Take 1 tablet by mouth every 6 (six) hours as needed for pain. 08/09/12  Yes Salley Scarlet, MD  metoprolol tartrate (LOPRESSOR) 25 MG tablet Take 1 tablet (25 mg total) by mouth 2 (two) times daily. 06/21/12  Yes Salley Scarlet, MD  pantoprazole (PROTONIX) 40 MG tablet Take 1 tablet (40 mg total) by mouth daily before breakfast. 06/21/12  Yes Salley Scarlet, MD     Allergies:  No Known Allergies  Social History:   reports that she has never smoked. She does not have any smokeless tobacco history on file. She reports that she does not drink alcohol or use illicit drugs.  Family History: History reviewed. No pertinent family history. Unable to obtain because of patient's mental status   Physical Exam:  Filed Vitals:   08/14/12 1800 08/14/12 1900 08/14/12 1945 08/14/12 2219  BP: 154/88 197/86 167/97 175/93  Pulse: 66 56 86 84  Temp:    98.4 F (36.9 C)  TempSrc:    Rectal  Resp: 22 25 20 22   Weight:      SpO2: 98% 97% 96% 97%   Blood pressure 175/93, pulse 84, temperature 98.4 F (36.9 C), temperature source Rectal, resp. rate 22, weight 45.36 kg (100 lb), SpO2 97.00%.  GEN:  Cachectic elderly Caucasian lady in lying in the stretcher in no acute distress;  PSYCH:  alert and oriented x2;  HEENT: Mucous membranes pink and anicteric; PERRLA; EOM intact; no cervical lymphadenopathy nor thyromegaly or carotid bruit;  no JVD; Breasts:: Not examined CHEST WALL: No tenderness CHEST: Normal respiration, clear to auscultation bilaterally HEART: Regular rate and rhythm; no murmurs rubs or gallops BACK: kyphosis or scoliosis; no CVA tenderness ABDOMEN: soft non-tender; no masses, no organomegaly, normal abdominal bowel sounds; no pannus; no intertriginous candida. Rectal Exam: Not done EXTREMITIES:  age-appropriate arthropathy of the hands and knees; no edema; no ulcerations. Generalized muscle waist Genitalia: not examined PULSES: 2+ and symmetric SKIN: Normal hydration no rash or ulceration CNS: Cranial nerves 2-12 grossly intact no focal lateralizing neurologic deficit   Labs on Admission:  Basic Metabolic Panel:  Recent Labs Lab 08/14/12 1802  NA 143  K 3.0*  CL 106  CO2 27  GLUCOSE 92  BUN 18  CREATININE 0.71  CALCIUM 11.1*   Liver Function Tests:  Recent Labs Lab 08/14/12 1802  AST 15  ALT 7  ALKPHOS 57  BILITOT 0.2*  PROT 6.3  ALBUMIN 3.5   No results found for this basename: LIPASE, AMYLASE,  in the last 168 hours No results found for this basename: AMMONIA,  in the last 168 hours CBC:  Recent Labs Lab 08/14/12 1802  WBC 6.2  NEUTROABS 3.6  HGB 11.7*  HCT 36.3  MCV 91.7  PLT 271   Cardiac Enzymes:  Recent Labs Lab 08/14/12 1802  TROPONINI <0.30   BNP: No components found with this basename: POCBNP,  D-dimer: No components found with this basename: D-DIMER,  CBG: No results found for this basename: GLUCAP,  in the last 168 hours  Radiological Exams on Admission: Ct Head Wo Contrast  08/14/2012  *RADIOLOGY REPORT*  Clinical Data: Hypertension.  Meningioma.  CT HEAD WITHOUT CONTRAST  Technique:  Contiguous axial images were obtained from the base of the skull through the vertex without contrast.  Comparison: 04/17/2012  Findings: Right frontal meningioma, 2.25 cm in width (formerly 2.4 cm) with considerable associated vasogenic edema in the right frontal lobe  similar to prior.  There is 10 mm of stable right to left midline shift along the frontal lobes.  Brain stem, cerebellum, cerebral peduncles, thalami, and basal ganglia remain intact. Periventricular and corona radiata white matter hypodensities are most compatible with chronic ischemic microvascular white matter disease.  There is mild effacement of the frontal horn the right lateral ventricle, similar to prior.  No intraventricular hemorrhage or acute CVA noted. The visualized paranasal sinuses appear clear.  IMPRESSION:  1.  Essentially stable right frontal meningioma associated with considerable chronic right frontal lobe vasogenic edema. There is 10 mm of stable right-to-left midline shift.   Original Report Authenticated By: Gaylyn Rong, M.D.    Dg Chest Portable 1 View  08/14/2012  *RADIOLOGY REPORT*  Clinical Data: Chest pain, weakness, dizziness, nausea  PORTABLE CHEST - 1 VIEW  Comparison: 04/15/2012  Findings: The patient is rotated to the right on today's exam, resulting in reduced diagnostic sensitivity and specificity. Tortuous and atherosclerotic aorta is again noted with cardiomegaly and dextroconvex lower thoracic scoliosis.  There is evidence old granulomatous disease and emphysema.  Cephalized blood flow noted.  IMPRESSION:  1.  Emphysema with cardiomegaly and cephalization of blood flow suggesting mild congestive heart failure. 2.  Tortuous, atherosclerotic, and ectatic thoracic aorta.   Original Report Authenticated By: Gaylyn Rong, M.D.     EKG: Independently reviewed. Multiple premature complexes   Assessment/Plan Present on Admission:  . B12 deficiency . Syncope and collapse . Adult failure to thrive . Hypokalemia . Meningioma . Malnutrition . Vasogenic cerebral edema . UTI (lower urinary tract infection) . Hypercalcemia   PLAN: We'll admit this lady to a MedSurg bed for hydration, repletion of potassium, will restart Decadron, and consult social services.  Because of her poor prognosis will not initiate a cardiology workup for syncope, and in any case there is a high likelihood that the syncope was related to her visit to thrive and brain tumor  Other plans as per orders.  Code Status: DNR Family Communication: Her son Madison Hickman has indicated that she'll be talking to social services this morning to try to arrange disposition  Disposition Plan: Likely skilled nursing facility    Ariannie Penaloza Nocturnist [facility] Pager [phone]   08/14/2012, 11:24 PM

## 2012-08-14 NOTE — ED Notes (Signed)
EMS administered 324 mg baby aspirin.

## 2012-08-14 NOTE — ED Notes (Signed)
Patient family verbalizing concern that patient be admitted due to patient living on her own.

## 2012-08-14 NOTE — ED Notes (Signed)
Pt reports that she passed out while at home on the commode.  Reports intermittent chest pain over the last week.  Pt is a poor historian.

## 2012-08-14 NOTE — ED Provider Notes (Signed)
History  This chart was scribed for [name], MD by Erskine Emery, ED Scribe. This patient was seen in room APA06/APA06 and the patient's care was started at 18:06.   CSN: [id number]  Arrival date & time 08/14/12  1752   First MD Initiated Contact with Patient 08/14/12 1806      Chief Complaint  Patient presents with  . Chest Pain  . Weakness  . Dizziness  . Nausea    (Consider location/radiation/quality/duration/timing/severity/associated sxs/prior Treatment) Virginie P Hickman is a 79 y.o. female brought in by ambulance, who presents to the Emergency Department complaining of LOC this afternoon while trying to get up off the commode. Pt reports she went to urinate, got lightheaded upon standing back up, then lost consciousness. No one witnessed the syncope. Pt reports some associated urinary frequency, difficulty ambulating, a cough, chest pain, and chills but denies any recent black or tarry stools or fever. Pt was recently diagnosed with bronchitis. Her son reports she had a nose bleed a few days ago. He says she lives with her other son, who does not provide her attentive care.  Patient is a 79 y.o. female presenting with syncope. The history is provided by the patient and a relative. No language interpreter was used.  Loss of Consciousness  This is a new problem. The current episode started 1 to 2 hours ago. The problem occurs hourly. The problem has not changed since onset.Length of episode of loss of consciousness: unknown. The problem is associated with standing up. Associated symptoms include chest pain, light-headedness and weakness. Pertinent negatives include abdominal pain, back pain, congestion, fever, headaches and seizures. She has tried nothing for the symptoms. The treatment provided no relief. Her past medical history is significant for HTN.   Dr. Jeanice Lim is the pt's PCP.  Past Medical History  Diagnosis Date  . Back pain, chronic   . Headache   . Hypertension   .  Arthritis   . Meningioma 04/15/2012  . IDA (iron deficiency anemia)   . Diastolic dysfunction Oct 2013    EF 50-55%   . Hypercalcemia     Past Surgical History  Procedure Laterality Date  . Abdominal hysterectomy    . Arm surgery      No family history on file.  History  Substance Use Topics  . Smoking status: Never Smoker   . Smokeless tobacco: Not on file  . Alcohol Use: No    OB History   Grav Para Term Preterm Abortions TAB SAB Ect Mult Living                  Review of Systems  Constitutional: Positive for chills. Negative for fever.  HENT: Positive for nosebleeds. Negative for congestion, sinus pressure and ear discharge.   Eyes: Negative for discharge.  Respiratory: Positive for cough.   Cardiovascular: Positive for chest pain and syncope.  Gastrointestinal: Negative for abdominal pain and diarrhea.  Genitourinary: Negative for frequency and hematuria.  Musculoskeletal: Negative for back pain.  Skin: Negative for rash.  Neurological: Positive for syncope, weakness and light-headedness. Negative for seizures and headaches.  Psychiatric/Behavioral: Negative for hallucinations.  All other systems reviewed and are negative.    Allergies  Review of patient's allergies indicates no known allergies.  Home Medications   Current Outpatient Rx  Name  Route  Sig  Dispense  Refill  . enalapril (VASOTEC) 10 MG tablet   Oral   Take 1 tablet (10 mg total) by mouth 2 (two)  times daily.   60 tablet   3   . ferrous sulfate 325 (65 FE) MG tablet   Oral   Take 1 tablet (325 mg total) by mouth daily with breakfast.   30 tablet   3   . furosemide (LASIX) 20 MG tablet   Oral   Take 1 tablet (20 mg total) by mouth daily.   30 tablet   3   . HYDROcodone-acetaminophen (NORCO/VICODIN) 5-325 MG per tablet   Oral   Take 1 tablet by mouth every 6 (six) hours as needed.   60 tablet   1   . metoprolol tartrate (LOPRESSOR) 25 MG tablet   Oral   Take 1 tablet (25 mg  total) by mouth 2 (two) times daily.   60 tablet   3   . pantoprazole (PROTONIX) 40 MG tablet   Oral   Take 1 tablet (40 mg total) by mouth daily before breakfast.   30 tablet   3     Triage Vitals: BP 181/101  Pulse 71  Temp(Src) 98.2 F (36.8 C) (Oral)  Resp 20  Wt 100 lb (45.36 kg)  BMI 19.53 kg/m2  SpO2 96%  Physical Exam  Nursing note and vitals reviewed. Constitutional: She is oriented to person, place, and time. She appears well-developed.  HENT:  Head: Normocephalic and atraumatic.  Eyes: Conjunctivae and EOM are normal. No scleral icterus.  Neck: Neck supple. No thyromegaly present.  Cardiovascular: Normal rate.  An irregular rhythm present. Exam reveals no gallop and no friction rub.   No murmur heard. Pulmonary/Chest: No stridor. She has no wheezes. She has no rales. She exhibits no tenderness.  A few crackles at both bases.  Abdominal: She exhibits no distension. There is no tenderness. There is no rebound.  Musculoskeletal: Normal range of motion. She exhibits no edema.  Lymphadenopathy:    She has no cervical adenopathy.  Neurological: She is oriented to person, place, and time.  Skin: No rash noted. No erythema.  Psychiatric: She has a normal mood and affect. Her behavior is normal.    ED Course  Procedures (including critical care time) DIAGNOSTIC STUDIES: Oxygen Saturation is 96% on room air, adequate by my interpretation.    COORDINATION OF CARE: 18:11--I evaluated the patient.  18:20--I rechecked and reevaluated the patient and we discussed a treatment plan including EKG, urinalysis, blood work, and chest x-ray to which the pt and her son agreed.   20:35--I rechecked the pt who states she is not in pain, she is just feeling sore. I notified the pt that her tests look fine other than a bladder infection. I explained to her that we would admit her over night for observation to be safe.   Labs Reviewed  CBC WITH DIFFERENTIAL  COMPREHENSIVE  METABOLIC PANEL  TROPONIN I   No results found.   No diagnosis found.    Date: 08/14/2012  Rate:72  Rhythm: premature atrial contractions (PAC)  QRS Axis: left  Intervals: normal  ST/T Wave abnormalities: nonspecific ST changes  Conduction Disutrbances:none  Narrative Interpretation:   Old EKG Reviewed: changes noted   MDM     The chart was scribed for me under my direct supervision.  I personally performed the history, physical, and medical decision making and all procedures in the evaluation of this patient.[name], MD 08/14/12 2039

## 2012-08-15 ENCOUNTER — Encounter (HOSPITAL_COMMUNITY): Payer: Self-pay | Admitting: General Practice

## 2012-08-15 DIAGNOSIS — I10 Essential (primary) hypertension: Secondary | ICD-10-CM

## 2012-08-15 DIAGNOSIS — N390 Urinary tract infection, site not specified: Principal | ICD-10-CM

## 2012-08-15 DIAGNOSIS — G936 Cerebral edema: Secondary | ICD-10-CM

## 2012-08-15 LAB — COMPREHENSIVE METABOLIC PANEL
AST: 15 U/L (ref 0–37)
Alkaline Phosphatase: 59 U/L (ref 39–117)
BUN: 14 mg/dL (ref 6–23)
CO2: 26 mEq/L (ref 19–32)
Chloride: 109 mEq/L (ref 96–112)
Creatinine, Ser: 0.63 mg/dL (ref 0.50–1.10)
GFR calc non Af Amer: 79 mL/min — ABNORMAL LOW (ref >90)
Potassium: 3.7 mEq/L (ref 3.5–5.1)
Total Bilirubin: 0.2 mg/dL — ABNORMAL LOW (ref 0.3–1.2)

## 2012-08-15 LAB — CBC
HCT: 38.7 % (ref 36.0–46.0)
MCV: 91.3 fL (ref 78.0–100.0)
RBC: 4.24 MIL/uL (ref 3.87–5.11)
WBC: 5.5 10*3/uL (ref 4.0–10.5)

## 2012-08-15 MED ORDER — ENALAPRIL MALEATE 5 MG PO TABS
10.0000 mg | ORAL_TABLET | Freq: Two times a day (BID) | ORAL | Status: DC
  Administered 2012-08-15 – 2012-08-16 (×3): 10 mg via ORAL
  Filled 2012-08-15 (×4): qty 2

## 2012-08-15 MED ORDER — DEXTROSE 5 % IV SOLN
1.0000 g | INTRAVENOUS | Status: DC
  Administered 2012-08-15 – 2012-08-16 (×2): 1 g via INTRAVENOUS
  Filled 2012-08-15 (×4): qty 10

## 2012-08-15 MED ORDER — HALOPERIDOL LACTATE 5 MG/ML IJ SOLN
2.0000 mg | Freq: Four times a day (QID) | INTRAMUSCULAR | Status: DC | PRN
  Administered 2012-08-15: 2 mg via INTRAVENOUS
  Filled 2012-08-15: qty 1

## 2012-08-15 MED ORDER — HYDRALAZINE HCL 20 MG/ML IJ SOLN
10.0000 mg | INTRAMUSCULAR | Status: DC | PRN
  Administered 2012-08-15 – 2012-08-17 (×2): 10 mg via INTRAVENOUS
  Filled 2012-08-15 (×2): qty 1

## 2012-08-15 MED ORDER — METOPROLOL TARTRATE 25 MG PO TABS
25.0000 mg | ORAL_TABLET | Freq: Two times a day (BID) | ORAL | Status: DC
  Administered 2012-08-15 – 2012-08-16 (×2): 25 mg via ORAL
  Filled 2012-08-15 (×3): qty 1

## 2012-08-15 MED ORDER — ALUM & MAG HYDROXIDE-SIMETH 200-200-20 MG/5ML PO SUSP
15.0000 mL | Freq: Four times a day (QID) | ORAL | Status: DC | PRN
  Administered 2012-08-15 – 2012-08-16 (×2): 15 mL via ORAL
  Filled 2012-08-15 (×2): qty 30

## 2012-08-15 MED ORDER — DEXAMETHASONE 4 MG PO TABS
4.0000 mg | ORAL_TABLET | Freq: Three times a day (TID) | ORAL | Status: DC
  Administered 2012-08-15 – 2012-08-16 (×2): 4 mg via ORAL
  Filled 2012-08-15 (×7): qty 1
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
## 2012-08-15 NOTE — Progress Notes (Addendum)
Chart reviewed.  Patient known to me from previous hospitalization. Discussed with patient's daughter.  Subjective:  feels better.  Objective: Vital signs in last 24 hours: Filed Vitals:   08/14/12 1900 08/14/12 1945 08/14/12 2219 08/14/12 2300  BP: 197/86 167/97 175/93 176/89  Pulse: 56 86 84 51  Temp:   98.4 F (36.9 C) 97.4 F (36.3 C)  TempSrc:   Rectal Oral  Resp: 25 20 22 18   Height:    5' (1.524 m)  Weight:    44.7 kg (98 lb 8.7 oz)  SpO2: 97% 96% 97% 96%   Weight change:   Intake/Output Summary (Last 24 hours) at 08/15/12 1106 Last data filed at 08/15/12 0942  Gross per 24 hour  Intake    432 ml  Output      0 ml  Net    432 ml   General: Alert. Appropriate. Forgetful. Lungs clear to auscultation bilaterally without wheeze rhonchi or rales Cardiovascular regular rate rhythm without murmurs gallops rubs Abdomen soft nontender nondistended Extremities no clubbing cyanosis or edema Neurologic: Cranial nerves intact. Sensorimotor exam grossly intact.  Lab Results: Basic Metabolic Panel:  Recent Labs Lab 08/14/12 1802 08/15/12 0530  NA 143 144  K 3.0* 3.7  CL 106 109  CO2 27 26  GLUCOSE 92 123*  BUN 18 14  CREATININE 0.71 0.63  CALCIUM 11.1* 10.7*  MG 2.0  --    Liver Function Tests:  Recent Labs Lab 08/14/12 1802 08/15/12 0530  AST 15 15  ALT 7 6  ALKPHOS 57 59  BILITOT 0.2* 0.2*  PROT 6.3 6.2  ALBUMIN 3.5 3.5   No results found for this basename: LIPASE, AMYLASE,  in the last 168 hours No results found for this basename: AMMONIA,  in the last 168 hours CBC:  Recent Labs Lab 08/14/12 1802 08/15/12 0530  WBC 6.2 5.5  NEUTROABS 3.6  --   HGB 11.7* 12.4  HCT 36.3 38.7  MCV 91.7 91.3  PLT 271 290   Cardiac Enzymes:  Recent Labs Lab 08/14/12 1802  TROPONINI <0.30   BNP:  Recent Labs Lab 08/14/12 1802  PROBNP 2072.0*   D-Dimer: No results found for this basename: DDIMER,  in the last 168 hours CBG: No results found for  this basename: GLUCAP,  in the last 168 hours Hemoglobin A1C: No results found for this basename: HGBA1C,  in the last 168 hours Fasting Lipid Panel: No results found for this basename: CHOL, HDL, LDLCALC, TRIG, CHOLHDL, LDLDIRECT,  in the last 168 hours Thyroid Function Tests: No results found for this basename: TSH, T4TOTAL, FREET4, T3FREE, THYROIDAB,  in the last 168 hours Coagulation: No results found for this basename: LABPROT, INR,  in the last 168 hours Anemia Panel: No results found for this basename: VITAMINB12, FOLATE, FERRITIN, TIBC, IRON, RETICCTPCT,  in the last 168 hours Urine Drug Screen: Drugs of Abuse  No results found for this basename: labopia, cocainscrnur, labbenz, amphetmu, thcu, labbarb    Alcohol Level: No results found for this basename: ETH,  in the last 168 hours Urinalysis:  Recent Labs Lab 08/14/12 2002  COLORURINE YELLOW  LABSPEC 1.015  PHURINE 8.0  GLUCOSEU NEGATIVE  HGBUR TRACE*  BILIRUBINUR NEGATIVE  KETONESUR NEGATIVE  PROTEINUR NEGATIVE  UROBILINOGEN 0.2  NITRITE POSITIVE*  LEUKOCYTESUR NEGATIVE   Micro Results: No results found for this or any previous visit (from the past 240 hour(s)). Studies/Results: Ct Head Wo Contrast  08/14/2012  *RADIOLOGY REPORT*  Clinical Data: Hypertension.  Meningioma.  CT HEAD WITHOUT CONTRAST  Technique:  Contiguous axial images were obtained from the base of the skull through the vertex without contrast.  Comparison: 04/17/2012  Findings: Right frontal meningioma, 2.25 cm in width (formerly 2.4 cm) with considerable associated vasogenic edema in the right frontal lobe similar to prior.  There is 10 mm of stable right to left midline shift along the frontal lobes.  Brain stem, cerebellum, cerebral peduncles, thalami, and basal ganglia remain intact. Periventricular and corona radiata white matter hypodensities are most compatible with chronic ischemic microvascular white matter disease.  There is mild effacement of  the frontal horn the right lateral ventricle, similar to prior.  No intraventricular hemorrhage or acute CVA noted. The visualized paranasal sinuses appear clear.  IMPRESSION:  1.  Essentially stable right frontal meningioma associated with considerable chronic right frontal lobe vasogenic edema. There is 10 mm of stable right-to-left midline shift.   Original Report Authenticated By: Gaylyn Rong, M.D.    Dg Chest Portable 1 View  08/14/2012  *RADIOLOGY REPORT*  Clinical Data: Chest pain, weakness, dizziness, nausea  PORTABLE CHEST - 1 VIEW  Comparison: 04/15/2012  Findings: The patient is rotated to the right on today's exam, resulting in reduced diagnostic sensitivity and specificity. Tortuous and atherosclerotic aorta is again noted with cardiomegaly and dextroconvex lower thoracic scoliosis.  There is evidence old granulomatous disease and emphysema.  Cephalized blood flow noted.  IMPRESSION:  1.  Emphysema with cardiomegaly and cephalization of blood flow suggesting mild congestive heart failure. 2.  Tortuous, atherosclerotic, and ectatic thoracic aorta.   Original Report Authenticated By: Gaylyn Rong, M.D.    Scheduled Meds: . cefTRIAXone (ROCEPHIN)  IV  1 g Intravenous Q24H  . dexamethasone  4 mg Intravenous Q6H  . pantoprazole  40 mg Oral QAC breakfast  . vitamin B-12  1,000 mcg Oral Daily   Continuous Infusions: . 0.9 % NaCl with KCl 20 mEq / L 50 mL/hr at 08/14/12 2358   PRN Meds:.acetaminophen, acetaminophen, alum & mag hydroxide-simeth, HYDROcodone-acetaminophen, HYDROmorphone (DILAUDID) injection, ondansetron (ZOFRAN) IV, ondansetron, polyethylene glycol, sorbitol, traZODone Assessment/Plan: Principal Problem:   Syncope and collapse Active Problems:   Hypokalemia   B12 deficiency   Meningioma   Malnutrition   Adult failure to thrive   Vasogenic cerebral edema   UTI (lower urinary tract infection)   Hypercalcemia  Continue antibiotics. IV fluids. Potassium  corrected. Discussed with patient's daughter. She is not sure whether or she would agree with hospice. She does agree with placement. I've asked her to discuss options with her brother. We could have a family meeting if they so desire. Physical therapy evaluation pending. Resume antihypertensives.  Sherran Margolis L 08/15/2012, 11:06 AM

## 2012-08-15 NOTE — Evaluation (Signed)
Occupational Therapy Evaluation Patient Details Name: Madison Hickman MRN: [id number] DOB: 22-Apr-1924 Today's Date: 08/15/2012 Time: 7829-5621 OT Time Calculation (min): 16 min  OT Assessment / Plan / Recommendation Clinical Impression  Patient is a 79 y/o female s/p  syncope and collapse presenting to acute OT with all education complete. Patient states that her son lives with her but does not provide the support and help that she requires. Patient also states that her health is declining and she only has a few months to live. Based on this information, I recommend a nursing home at D/C.     OT Assessment  Patient does not need any further OT services    Follow Up Recommendations  Other (comment) (nursing home)       Equipment Recommendations  None recommended by OT          Precautions / Restrictions Precautions Precautions: Fall Restrictions Weight Bearing Restrictions: No   Pertinent Vitals/Pain No complaints.     Visit Information  Last OT Received On: 08/15/12 Assistance Needed: +1    Subjective Data  Subjective: "I know I can't  go home."   Prior Functioning     Home Living Lives With: Son Available Help at Discharge: Family;Available PRN/intermittently Type of Home: House Home Access: Level entry Home Layout: One level Bathroom Shower/Tub: Other (comment) (sponge bath) Bathroom Toilet: Standard Home Adaptive Equipment: Bedside commode/3-in-1;Straight cane Prior Function Level of Independence: Needs assistance (needs assistance but doesn't get the help she needs from son) Able to Take Stairs?: No Driving: No Vocation: Retired Musician: No difficulties         Vision/Perception Vision - History Baseline Vision: No visual deficits Patient Visual Report: No change from baseline   Cognition  Cognition Overall Cognitive Status: Appears within functional limits for tasks assessed/performed Arousal/Alertness:  Awake/alert Orientation Level: Appears intact for tasks assessed Behavior During Session: [facility] for tasks performed    Extremity/Trunk Assessment Right Upper Extremity Assessment RUE ROM/Strength/Tone: Within functional levels (MMT: 3/5) Left Upper Extremity Assessment LUE ROM/Strength/Tone: Within functional levels (MMT; 3/5)              End of Session OT - End of Session Activity Tolerance: Patient tolerated treatment well Patient left: in bed;with call bell/phone within reach;with bed alarm set   Limmie Patricia, OTR/L  08/15/2012, 2:31 PM

## 2012-08-15 NOTE — Progress Notes (Signed)
UR Chart Review Completed  

## 2012-08-15 NOTE — Evaluation (Signed)
Physical Therapy Evaluation Patient Details Name: Madison Hickman MRN: [id number] DOB: 12-Feb-1924 Today's Date: 08/15/2012 Time: 1133-1208 PT Time Calculation (min): 35 min  PT Assessment / Plan / Recommendation Clinical Impression  Upon eval,pt was found to be deconditioned with compromised standing balance.  She is alert and cooperative, very frail body habitus.  She states that she normally uses a cane for gait, but I am recommending that she use a walker for increased safety.  She is interested in returning to SNF at d/c, however I am not sure how much rehab potential she has.  I will recommend SNF in order to give her a chance.    PT Assessment  All further PT needs can be met in the next venue of care    Follow Up Recommendations  SNF    Does the patient have the potential to tolerate intense rehabilitation      Barriers to Discharge        Equipment Recommendations  None recommended by PT    Recommendations for Other Services     Frequency      Precautions / Restrictions Precautions Precautions: Fall Restrictions Weight Bearing Restrictions: No   Pertinent Vitals/Pain       Mobility  Bed Mobility Bed Mobility: Supine to Sit;Sit to Supine Supine to Sit: 5: Supervision (HOB at 30 deg) Sit to Supine: Not Tested (comment) Transfers Transfers: Sit to Stand;Stand to Sit;Stand Pivot Transfers Sit to Stand: 5: Supervision;With upper extremity assist;From bed Stand to Sit: 5: Supervision;With upper extremity assist;To chair/3-in-1 Stand Pivot Transfers: 5: Supervision;With armrests Details for Transfer Assistance: pt has apparently not been OOB for several days, so did fairly well with transfers Ambulation/Gait Ambulation/Gait Assistance: 5: Supervision Ambulation Distance (Feet): 100 Feet Assistive device: Rolling walker Ambulation/Gait Assistance Details: pt normally uses a cane at home, however her balance is somewhat compromised and I would highly recommend that  she use a walker Gait Pattern: Trunk flexed;Within Functional Limits Gait velocity: WNL Stairs: No Wheelchair Mobility Wheelchair Mobility: No    Exercises     PT Diagnosis: Difficulty walking;Generalized weakness  PT Problem List: Decreased strength;Decreased activity tolerance;Decreased mobility PT Treatment Interventions:     PT Goals    Visit Information  Last PT Received On: 08/15/12    Subjective Data  Subjective: I've gotten so weak Patient Stated Goal: wants to go to SNF   Prior Functioning  Home Living Lives With: Son Available Help at Discharge: Family;Available PRN/intermittently Type of Home: House Home Access: Level entry Home Layout: One level Bathroom Shower/Tub: Other (comment) (takes a sponge bath) Bathroom Toilet: Standard Home Adaptive Equipment: Straight cane;Walker - rolling;Bedside commode/3-in-1 Prior Function Level of Independence: Independent with assistive device(s) Able to Take Stairs?: No Driving: No Vocation: Retired Musician: No difficulties    Copywriter, advertising Overall Cognitive Status: Appears within functional limits for tasks assessed/performed Arousal/Alertness: Awake/alert Orientation Level: Appears intact for tasks assessed Behavior During Session: [facility] for tasks performed    Extremity/Trunk Assessment Right Lower Extremity Assessment RLE ROM/Strength/Tone: WFL for tasks assessed RLE Sensation: WFL - Light Touch RLE Coordination: WFL - gross motor Left Lower Extremity Assessment LLE ROM/Strength/Tone: WFL for tasks assessed LLE Sensation: WFL - Light Touch LLE Coordination: WFL - gross motor Trunk Assessment Trunk Assessment: Kyphotic   Balance Balance Balance Assessed: Yes Static Standing Balance Static Standing - Balance Support: No upper extremity supported Static Standing - Level of Assistance: 4: Min assist (tends to fall backward)  End of Session PT -  End of Session Equipment Utilized  During Treatment: Gait belt Activity Tolerance: Patient limited by fatigue Patient left: in chair;with chair alarm set;with call bell/phone within reach Nurse Communication: Mobility status  GP     Konrad Penta 08/15/2012, 12:22 PM

## 2012-08-15 NOTE — Progress Notes (Signed)
Patient refusing medications,and being combative at times,B/P 196/101,Dr Orvan Falconer notified.

## 2012-08-15 NOTE — Progress Notes (Signed)
INITIAL NUTRITION ASSESSMENT  DOCUMENTATION CODES Per approved criteria  -Severe malnutrition in the context of chronic illness   INTERVENTION:  Downgrade diet to Mechanical Soft and add gravy to meats and potatoes to enhance comfort and palatability   Magic cup BID between meals, each supplement provides 290 kcal and 9 grams of protein.  NUTRITION DIAGNOSIS: Malnutrition related to inadequate oral intake as evidenced by severe loss of both muscle and subcutaneous fat .   Goal: Pt to meet >/= 90% of their estimated nutrition needs  Monitor:  Plan of care, diet tolerance and po intake  Reason for Assessment: Malnutrition Screen  79 y.o. female  Admitting Dx: Syncope and collapse  ASSESSMENT: Pt from home. Gradual decline noted. Pt is very pleasant lady who tells me she's eating "pretty good". Wt is stable since early October RD assessment. Her hx includes : Adult FTT, malnutrition and brain tumor. She meets criteria for severe malnutrition in the context of chronic illness given her severe loss of muscle and fat. Pt is hospice appropriate per MD, discharge planning in process.  Nutrition Focused Physical Exam:  Subcutaneous Fat:  Orbital Region: severe malnutritiion Upper Arm Region: severe malnutrition Thoracic and Lumbar Region: n/a  Muscle:  Temple Region: n/a Clavicle Bone Region: severe malnutrition Clavicle and Acromion Bone Region: severe malnutriton Scapular Bone Region: mild-moderate malnutrition Dorsal Hand: severe malnutrition Patellar Region: mild-moderate malnutrition Anterior Thigh Region: n/a Posterior Calf Region: n/a  Edema: none noted   Height: Ht Readings from Last 1 Encounters:  08/14/12 5' (1.524 m)    Weight: Wt Readings from Last 1 Encounters:  08/14/12 98 lb 8.7 oz (44.7 kg)    Ideal Body Weight: 100# (45.4 kg)   % Ideal Body Weight: 98%  Wt Readings from Last 10 Encounters:  08/14/12 98 lb 8.7 oz (44.7 kg)  06/21/12 105 lb 0.6  oz (47.646 kg)  04/20/12 89 lb 8.1 oz (40.6 kg)  04/20/12 89 lb 8.1 oz (40.6 kg)  01/16/11 102 lb (46.267 kg)    Usual Body Weight: ~100# (04/15/12- nutrition assessment)  % Usual Body Weight: 98%  BMI:  Body mass index is 19.25 kg/(m^2). Normal  Estimated Nutritional Needs: Kcal: 1200-1400  Protein: 50-60 gr Fluid: 1 ml/kcal  Skin: area noted on back  Diet Order: General Regular diet   EDUCATION NEEDS: -No education needs identified at this time   Intake/Output Summary (Last 24 hours) at 08/15/12 1427 Last data filed at 08/15/12 0942  Gross per 24 hour  Intake    432 ml  Output      0 ml  Net    432 ml    Last BM:  PTA  Labs:   Recent Labs Lab 08/14/12 1802 08/15/12 0530  NA 143 144  K 3.0* 3.7  CL 106 109  CO2 27 26  BUN 18 14  CREATININE 0.71 0.63  CALCIUM 11.1* 10.7*  MG 2.0  --   GLUCOSE 92 123*    CBG (last 3)  No results found for this basename: GLUCAP,  in the last 72 hours  Scheduled Meds: . cefTRIAXone (ROCEPHIN)  IV  1 g Intravenous Q24H  . dexamethasone  4 mg Oral Q8H  . enalapril  10 mg Oral BID  . metoprolol tartrate  25 mg Oral BID  . pantoprazole  40 mg Oral QAC breakfast  . vitamin B-12  1,000 mcg Oral Daily    Continuous Infusions: . 0.9 % NaCl with KCl 20 mEq / L 50 mL/hr  at 08/14/12 2358    Past Medical History  Diagnosis Date  . Back pain, chronic   . Headache   . Hypertension   . Arthritis   . Meningioma 04/15/2012  . IDA (iron deficiency anemia)   . Diastolic dysfunction Oct 2013    EF 50-55%   . Hypercalcemia     Past Surgical History  Procedure Laterality Date  . Abdominal hysterectomy    . Arm surgery      [phone]

## 2012-08-15 NOTE — Clinical Social Work Placement (Signed)
Clinical Social Work Department CLINICAL SOCIAL WORK PLACEMENT NOTE 08/15/2012  Patient:  Madison Hickman, Madison Hickman  Account Number:  [id number] Admit date:  08/14/2012  Clinical Social Worker:  Derenda Fennel, LCSW  Date/time:  08/15/2012 03:50 PM  Clinical Social Work is seeking post-discharge placement for this patient at the following level of care:   SKILLED NURSING   (*CSW will update this form in Epic as items are completed)   08/15/2012  Patient/family provided with [facility] Department of Clinical Social Work's list of facilities offering this level of care within the geographic area requested by the patient (or if unable, by the patient's family).  08/15/2012  Patient/family informed of their freedom to choose among providers that offer the needed level of care, that participate in Medicare, Medicaid or managed care program needed by the patient, have an available bed and are willing to accept the patient.  08/15/2012  Patient/family informed of [facility]' ownership interest in [facility], as well as of the fact that they are under no obligation to receive care at this facility.  PASARR submitted to EDS on  PASARR number received from EDS on   FL2 transmitted to all facilities in geographic area requested by pt/family on  08/15/2012 FL2 transmitted to all facilities within larger geographic area on   Patient informed that his/her managed care company has contracts with or will negotiate with  certain facilities, including the following:     Patient/family informed of bed offers received:   Patient chooses bed at  Physician recommends and patient chooses bed at    Patient to be transferred to  on   Patient to be transferred to facility by   The following physician request were entered in Epic:   Additional Comments: existing pasarr number.  Derenda Fennel, [place] [phone]

## 2012-08-16 DIAGNOSIS — R51 Headache: Secondary | ICD-10-CM

## 2012-08-16 DIAGNOSIS — R404 Transient alteration of awareness: Secondary | ICD-10-CM

## 2012-08-16 DIAGNOSIS — R410 Disorientation, unspecified: Secondary | ICD-10-CM | POA: Clinically undetermined

## 2012-08-16 MED ORDER — METOPROLOL TARTRATE 50 MG PO TABS
50.0000 mg | ORAL_TABLET | Freq: Two times a day (BID) | ORAL | Status: DC
  Administered 2012-08-16: 50 mg via ORAL
  Filled 2012-08-16: qty 1

## 2012-08-16 MED ORDER — FUROSEMIDE 20 MG PO TABS
20.0000 mg | ORAL_TABLET | Freq: Every day | ORAL | Status: DC
  Administered 2012-08-16: 20 mg via ORAL
  Filled 2012-08-16: qty 1

## 2012-08-16 MED ORDER — FERROUS SULFATE 325 (65 FE) MG PO TABS
325.0000 mg | ORAL_TABLET | Freq: Every day | ORAL | Status: DC
  Administered 2012-08-17: 325 mg via ORAL
  Filled 2012-08-16: qty 1

## 2012-08-16 NOTE — Progress Notes (Addendum)
Overnight, became confused and combative. Refused to take her medications. Improved today per nursing staff.  Subjective: Complains of chronic headache. Denies shortness of breath or other complaints.  Objective: Vital signs in last 24 hours: Filed Vitals:   08/16/12 0011 08/16/12 0638 08/16/12 1036 08/16/12 1040  BP: 188/76 124/92 179/109   Pulse:  79  73  Temp:  98.2 F (36.8 C)    TempSrc:  Axillary    Resp:  19    Height:      Weight:  46.2 kg (101 lb 13.6 oz)    SpO2:  97%     Weight change: 0.84 kg (1 lb 13.6 oz)  Intake/Output Summary (Last 24 hours) at 08/16/12 1134 Last data filed at 08/16/12 0845  Gross per 24 hour  Intake 1531.67 ml  Output      0 ml  Net 1531.67 ml   General: Alert. Confused. Does not remember me. Cooperative. Calm. Lungs clear to auscultation bilaterally without wheeze rhonchi or rales Cardiovascular regular rate rhythm without murmurs gallops rubs Abdomen soft nontender nondistended Extremities no clubbing cyanosis or edema Neurologic: Cranial nerves intact. Sensorimotor exam grossly intact. Psychiatric: Somewhat paranoid. Cooperative and pleasant.  Lab Results: Basic Metabolic Panel:  Recent Labs Lab 08/14/12 1802 08/15/12 0530  NA 143 144  K 3.0* 3.7  CL 106 109  CO2 27 26  GLUCOSE 92 123*  BUN 18 14  CREATININE 0.71 0.63  CALCIUM 11.1* 10.7*  MG 2.0  --    Liver Function Tests:  Recent Labs Lab 08/14/12 1802 08/15/12 0530  AST 15 15  ALT 7 6  ALKPHOS 57 59  BILITOT 0.2* 0.2*  PROT 6.3 6.2  ALBUMIN 3.5 3.5   No results found for this basename: LIPASE, AMYLASE,  in the last 168 hours No results found for this basename: AMMONIA,  in the last 168 hours CBC:  Recent Labs Lab 08/14/12 1802 08/15/12 0530  WBC 6.2 5.5  NEUTROABS 3.6  --   HGB 11.7* 12.4  HCT 36.3 38.7  MCV 91.7 91.3  PLT 271 290   Cardiac Enzymes:  Recent Labs Lab 08/14/12 1802  TROPONINI <0.30   BNP:  Recent Labs Lab 08/14/12 1802   PROBNP 2072.0*   D-Dimer: No results found for this basename: DDIMER,  in the last 168 hours CBG: No results found for this basename: GLUCAP,  in the last 168 hours Hemoglobin A1C: No results found for this basename: HGBA1C,  in the last 168 hours Fasting Lipid Panel: No results found for this basename: CHOL, HDL, LDLCALC, TRIG, CHOLHDL, LDLDIRECT,  in the last 168 hours Thyroid Function Tests:  Recent Labs Lab 08/14/12 1802  TSH 1.876   Coagulation: No results found for this basename: LABPROT, INR,  in the last 168 hours Anemia Panel: No results found for this basename: VITAMINB12, FOLATE, FERRITIN, TIBC, IRON, RETICCTPCT,  in the last 168 hours Urine Drug Screen: Drugs of Abuse  No results found for this basename: labopia,  cocainscrnur,  labbenz,  amphetmu,  thcu,  labbarb    Alcohol Level: No results found for this basename: ETH,  in the last 168 hours Urinalysis:  Recent Labs Lab 08/14/12 2002  COLORURINE YELLOW  LABSPEC 1.015  PHURINE 8.0  GLUCOSEU NEGATIVE  HGBUR TRACE*  BILIRUBINUR NEGATIVE  KETONESUR NEGATIVE  PROTEINUR NEGATIVE  UROBILINOGEN 0.2  NITRITE POSITIVE*  LEUKOCYTESUR NEGATIVE   Micro Results: Recent Results (from the past 240 hour(s))  URINE CULTURE     Status: None  Collection Time    08/14/12  8:02 PM      Result Value Range Status   Specimen Description URINE, CLEAN CATCH   Final   Special Requests NONE   Final   Culture  Setup Time 08/15/2012 17:02   Final   Colony Count >=100,000 COLONIES/ML   Final   Culture ESCHERICHIA COLI   Final   Report Status PENDING   Incomplete   Studies/Results: Ct Head Wo Contrast  08/14/2012  *RADIOLOGY REPORT*  Clinical Data: Hypertension.  Meningioma.  CT HEAD WITHOUT CONTRAST  Technique:  Contiguous axial images were obtained from the base of the skull through the vertex without contrast.  Comparison: 04/17/2012  Findings: Right frontal meningioma, 2.25 cm in width (formerly 2.4 cm) with  considerable associated vasogenic edema in the right frontal lobe similar to prior.  There is 10 mm of stable right to left midline shift along the frontal lobes.  Brain stem, cerebellum, cerebral peduncles, thalami, and basal ganglia remain intact. Periventricular and corona radiata white matter hypodensities are most compatible with chronic ischemic microvascular white matter disease.  There is mild effacement of the frontal horn the right lateral ventricle, similar to prior.  No intraventricular hemorrhage or acute CVA noted. The visualized paranasal sinuses appear clear.  IMPRESSION:  1.  Essentially stable right frontal meningioma associated with considerable chronic right frontal lobe vasogenic edema. There is 10 mm of stable right-to-left midline shift.   Original Report Authenticated By: Gaylyn Rong, M.D.    Dg Chest Portable 1 View  08/14/2012  *RADIOLOGY REPORT*  Clinical Data: Chest pain, weakness, dizziness, nausea  PORTABLE CHEST - 1 VIEW  Comparison: 04/15/2012  Findings: The patient is rotated to the right on today's exam, resulting in reduced diagnostic sensitivity and specificity. Tortuous and atherosclerotic aorta is again noted with cardiomegaly and dextroconvex lower thoracic scoliosis.  There is evidence old granulomatous disease and emphysema.  Cephalized blood flow noted.  IMPRESSION:  1.  Emphysema with cardiomegaly and cephalization of blood flow suggesting mild congestive heart failure. 2.  Tortuous, atherosclerotic, and ectatic thoracic aorta.   Original Report Authenticated By: Gaylyn Rong, M.D.    Scheduled Meds: . cefTRIAXone (ROCEPHIN)  IV  1 g Intravenous Q24H  . enalapril  10 mg Oral BID  . [START ON 08/17/2012] ferrous sulfate  325 mg Oral Q breakfast  . furosemide  20 mg Oral Daily  . metoprolol tartrate  25 mg Oral BID  . pantoprazole  40 mg Oral QAC breakfast  . vitamin B-12  1,000 mcg Oral Daily   Continuous Infusions:   PRN Meds:.acetaminophen,  acetaminophen, alum & mag hydroxide-simeth, haloperidol lactate, hydrALAZINE, HYDROcodone-acetaminophen, HYDROmorphone (DILAUDID) injection, ondansetron (ZOFRAN) IV, ondansetron, polyethylene glycol, sorbitol, traZODone Assessment/Plan: Principal Problem:   Syncope and collapse: likely related to urinary tract infection and dehydration.  Active Problems:   Hypokalemia results    Vasogenic cerebral edema, chronic    UTI (lower urinary tract infection): Escherichia coli growing from culture. Sensitivities pending. Continue ceftriaxone.    Acute delirium: Patient had a similar episode during previous hospitalization. Likely related to dementia, sun downers. Also could be related to steroids. Will stop Decadron. Give Haldol as needed. Patient is much more cooperative currently. Chronic diastolic heart failure: Will stop IV fluids. Patient is euvolemic currently. We'll resume Lasix to avoid acute exacerbation or   B12 deficiency   Chronic headache: Pain medication as needed.    Meningioma, Nonoperative.   Severe protein calorie Malnutrition   Adult failure to  thrive   Hypercalcemia  Hypertension: Uncontrolled related to salt load and agitation. However, may need to adjust medications.   per family, they would like placement but not yet ready for hospice services. Continue DO NOT RESUSCITATE status.  Keniyah Gelinas L 08/16/2012, 11:34 AM

## 2012-08-16 NOTE — Clinical Social Work Note (Signed)
CSW presented bed offers to pt and pt's son and daughter. They accept bed at [facility]. Facility notified and are aware of request for private room if available. CSW to continue to follow and assist with d/c planning.  Derenda Fennel, [place] [phone]

## 2012-08-16 NOTE — Clinical Documentation Improvement (Signed)
MALNUTRITION DOCUMENTATION CLARIFICATION  THIS DOCUMENT IS NOT A PERMANENT PART OF THE MEDICAL RECORD  TO RESPOND TO THE THIS QUERY, FOLLOW THE INSTRUCTIONS BELOW:  1. If needed, update documentation for the patient's encounter via the notes activity.  2. Access this query again and click edit on the In [employer].  3. After updating, or not, click F2 to complete all highlighted (required) fields concerning your review. Select "additional documentation in the medical record" OR "no additional documentation provided".  4. Click Sign note button.  5. The deficiency will fall out of your In Basket *Please let us know if you are not able to complete this workflow by phone or e-mail (listed below).  Please update your documentation within the medical record to reflect your response to this query.                                                                                        08/16/12   Dear Dr. Lendell Caprice / Associates,  In a better effort to capture your patient's severity of illness, reflect appropriate length of stay and utilization of resources, a review of the patient medical record has revealed the following indicators.    Based on your clinical judgment, please clarify and document in a progress note and/or discharge summary the clinical condition associated with the following supporting information:  In responding to this query please exercise your independent judgment.  The fact that a query is asked, does not imply that any particular answer is desired or expected.  Please clarify Severity of Malnutrition Thank you,  Possible Clinical Conditions? Mild Malnutrition  Moderate Malnutrition Severe Malnutrition   Protein Calorie Malnutrition Severe Protein Calorie Malnutrition Other Condition________________ Cannot clinically determine  Supporting Information:   Risk Factors: Advanced age: 79 "Adult Failure to Thrive" Meningioma Vasogenic cerebral  edema Cachectic "malnutrition" Syncope & collapse" Confusion Per RD assessment: She meets criteria for severe malnutrition in the context of chronic illness given her severe loss of muscle and fat. Pt is hospice appropriate per MD, discharge planning in process  Signs & Symptoms: -Ht: 5' Wt: 44.7kg  -BMI: 19.25  Treatments: -Enteral Feeding:  -Medications:  -Nutrition Consult:NUTRITION DIAGNOSIS: Malnutrition related to inadequate oral intake as evidenced by severe loss of both muscle and subcutaneous fat .  Goal: Pt to meet >/= 90% of their estimated nutrition needs Monitor: Plan of care, diet tolerance and po intake INTERVENTION: Downgrade diet to Mechanical Soft and add gravy to meats and potatoes to enhance comfort and palatability  Magic cup BID between meals, each supplement provides 290 kcal and 9 grams of protein. Nutrition Focused Physical Exam: Subcutaneous Fat:  Orbital Region: severe malnutritiion Upper Arm Region: severe malnutrition Thoracic and Lumbar Region: n/a Muscle:  Temple Region: n/a Clavicle Bone Region: severe malnutrition Clavicle and Acromion Bone Region: severe malnutriton Scapular Bone Region: mild-moderate malnutrition Dorsal Hand: severe malnutrition Patellar Region: mild-moderate malnutrition Anterior Thigh Region: n/a Posterior Calf Region: n/a  You may use possible, probable, or suspect with inpatient documentation. possible, probable, suspected diagnoses MUST be documented at the time of discharge  Reviewed: Responded on d/c sum-Severe protein calorie malnutrition  Thank You,  Harless Litten RN, MSN Clinical Documentation Specialist: Office# [phone] [facility] Information Management [place]

## 2012-08-16 NOTE — Clinical Social Work Placement (Signed)
Clinical Social Work Department CLINICAL SOCIAL WORK PLACEMENT NOTE 08/16/2012  Patient:  Madison Hickman, Madison Hickman  Account Number:  [id number] Admit date:  08/14/2012  Clinical Social Worker:  Derenda Fennel, LCSW  Date/time:  08/15/2012 03:50 PM  Clinical Social Work is seeking post-discharge placement for this patient at the following level of care:   SKILLED NURSING   (*CSW will update this form in Epic as items are completed)   08/15/2012  Patient/family provided with [facility] Department of Clinical Social Work's list of facilities offering this level of care within the geographic area requested by the patient (or if unable, by the patient's family).  08/15/2012  Patient/family informed of their freedom to choose among providers that offer the needed level of care, that participate in Medicare, Medicaid or managed care program needed by the patient, have an available bed and are willing to accept the patient.  08/15/2012  Patient/family informed of [facility]' ownership interest in [facility], as well as of the fact that they are under no obligation to receive care at this facility.  PASARR submitted to EDS on  PASARR number received from EDS on   FL2 transmitted to all facilities in geographic area requested by pt/family on  08/15/2012 FL2 transmitted to all facilities within larger geographic area on   Patient informed that his/her managed care company has contracts with or will negotiate with  certain facilities, including the following:     Patient/family informed of bed offers received:  08/16/2012 Patient chooses bed at [facility] [place] Physician recommends and patient chooses bed at  [facility] [place]  Patient to be transferred to  on   Patient to be transferred to facility by   The following physician request were entered in Epic:   Additional Comments: existing pasarr number.   Derenda Fennel, [place] [phone]

## 2012-08-16 NOTE — Care Management Note (Addendum)
    Page 1 of 1   08/17/2012     2:23:32 PM   CARE MANAGEMENT NOTE 08/17/2012  Patient:  Madison Hickman, Madison Hickman   Account Number:  [id number]  Date Initiated:  08/16/2012  Documentation initiated by:  Rosemary Holms  Subjective/Objective Assessment:   Pt admitted from home where she lives with her son. Family decided placement in SNF best DC plan at this time. CSW working on assisting with DC to SNF [facility])     Action/Plan:   Anticipated DC Date:  08/17/2012   Anticipated DC Plan:  SKILLED NURSING FACILITY  In-house referral  Clinical Social Worker      DC Planning Services  CM consult      Choice offered to / List presented to:             Status of service:  Completed, signed off Medicare Important Message given?  YES (If response is "NO", the following Medicare IM given date fields will be blank) Date Medicare IM given:  08/17/2012 Date Additional Medicare IM given:    Discharge Disposition:  SKILLED NURSING FACILITY  Per UR Regulation:    If discussed at Long Length of Stay Meetings, dates discussed:    Comments:  08/16/12 Rosemary Holms RN BSN CM

## 2012-08-17 DIAGNOSIS — I509 Heart failure, unspecified: Secondary | ICD-10-CM

## 2012-08-17 DIAGNOSIS — I5032 Chronic diastolic (congestive) heart failure: Secondary | ICD-10-CM

## 2012-08-17 LAB — URINE CULTURE: Colony Count: >=100000

## 2012-08-17 MED ORDER — METOPROLOL TARTRATE 50 MG PO TABS: 50.0000 mg | ORAL_TABLET | Freq: Two times a day (BID) | ORAL | Status: DC

## 2012-08-17 MED ORDER — HYDROCODONE-ACETAMINOPHEN 5-325 MG PO TABS: 1.0000 | ORAL_TABLET | ORAL | Status: DC | PRN

## 2012-08-17 MED ORDER — HALOPERIDOL 1 MG PO TABS: 1.0000 mg | ORAL_TABLET | Freq: Two times a day (BID) | ORAL | Status: DC | PRN

## 2012-08-17 MED ORDER — CYANOCOBALAMIN 1000 MCG/ML IJ SOLN: 1000.0000 ug | INTRAMUSCULAR | Status: DC

## 2012-08-17 MED ORDER — CEFUROXIME AXETIL 500 MG PO TABS: 500.0000 mg | ORAL_TABLET | Freq: Two times a day (BID) | ORAL | Status: DC

## 2012-08-17 MED ORDER — DOCUSATE SODIUM 100 MG PO CAPS: 100.0000 mg | ORAL_CAPSULE | Freq: Two times a day (BID) | ORAL | Status: DC

## 2012-08-17 MED ORDER — ACETAMINOPHEN 325 MG PO TABS: 650.0000 mg | ORAL_TABLET | Freq: Four times a day (QID) | ORAL | Status: DC | PRN

## 2012-08-17 NOTE — Progress Notes (Signed)
Called report to [place], Charity fundraiser at [employer].  Verbalized understanding. Pt dc'd to facility with son. [place], Candelaria Stagers 08/17/2012

## 2012-08-17 NOTE — Clinical Social Work Placement (Signed)
Clinical Social Work Department CLINICAL SOCIAL WORK PLACEMENT NOTE 08/17/2012  Patient:  Madison Hickman, KANAAN  Account Number:  [id number] Admit date:  08/14/2012  Clinical Social Worker:  Derenda Fennel, LCSW  Date/time:  08/15/2012 03:50 PM  Clinical Social Work is seeking post-discharge placement for this patient at the following level of care:   SKILLED NURSING   (*CSW will update this form in Epic as items are completed)   08/15/2012  Patient/family provided with [facility] Department of Clinical Social Work's list of facilities offering this level of care within the geographic area requested by the patient (or if unable, by the patient's family).  08/15/2012  Patient/family informed of their freedom to choose among providers that offer the needed level of care, that participate in Medicare, Medicaid or managed care program needed by the patient, have an available bed and are willing to accept the patient.  08/15/2012  Patient/family informed of [facility]' ownership interest in [facility], as well as of the fact that they are under no obligation to receive care at this facility.  PASARR submitted to EDS on  PASARR number received from EDS on   FL2 transmitted to all facilities in geographic area requested by pt/family on  08/15/2012 FL2 transmitted to all facilities within larger geographic area on   Patient informed that his/her managed care company has contracts with or will negotiate with  certain facilities, including the following:     Patient/family informed of bed offers received:  08/16/2012 Patient chooses bed at [facility] [place] Physician recommends and patient chooses bed at  [facility] [place]  Patient to be transferred to [facility] [place] on  08/17/2012 Patient to be transferred to facility by family  The following physician request were entered in Epic:   Additional Comments: existing pasarr number.  Derenda Fennel, [place] [phone]

## 2012-08-17 NOTE — Clinical Social Work Note (Signed)
Pt d/c today to [facility]. Pt, pt's son [name], and facility aware and agreeable. Mark to complete paperwork at [facility] and provide transport to facility. D/C summary faxed. Debbie at [facility] notified that pt was agitated two nights ago regarding taking medication, but has a good night last night per RN. No behaviors noted.  Derenda Fennel, [place] [phone]

## 2012-08-17 NOTE — Discharge Summary (Signed)
Physician Discharge Summary  Patient ID: Madison Hickman MRN: [id number] DOB/AGE: 10-Jun-1924 79 y.o.  Admit date: 08/14/2012 Discharge date: 08/17/2012  Discharge Diagnoses:  Principal Problem:   Syncope and collapse Active Problems:   Chronic diastolic congestive heart failure   Hypokalemia   Malignant hypertension    E coli UTI (lower urinary tract infection), cultures pending   Acute delirium   B12 deficiency   Chronic headache   Meningioma with chronic vasogenic edema   Malnutrition   Adult failure to thrive   Hypercalcemia Severe protein calorie malnutrition  Tests pending at the time of discharge: Final urine culture sensitivities    Medication List    TAKE these medications       acetaminophen 325 MG tablet  Commonly known as:  TYLENOL  Take 2 tablets (650 mg total) by mouth every 6 (six) hours as needed.     cefUROXime 500 MG tablet  Commonly known as:  CEFTIN  Take 1 tablet (500 mg total) by mouth 2 (two) times daily. For 2 more days     docusate sodium 100 MG capsule  Commonly known as:  COLACE  Take 1 capsule (100 mg total) by mouth 2 (two) times daily.     enalapril 10 MG tablet  Commonly known as:  VASOTEC  Take 1 tablet (10 mg total) by mouth 2 (two) times daily.     ferrous sulfate 325 (65 FE) MG tablet  Take 1 tablet (325 mg total) by mouth daily with breakfast.     furosemide 20 MG tablet  Commonly known as:  LASIX  Take 1 tablet (20 mg total) by mouth daily.     haloperidol 1 MG tablet  Commonly known as:  HALDOL  Take 1 tablet (1 mg total) by mouth 2 (two) times daily as needed (agitation).     HYDROcodone-acetaminophen 5-325 MG per tablet  Commonly known as:  NORCO/VICODIN  Take 1 tablet by mouth every 4 (four) hours as needed for pain.     metoprolol 50 MG tablet  Commonly known as:  LOPRESSOR  Take 1 tablet (50 mg total) by mouth 2 (two) times daily.     pantoprazole 40 MG tablet  Commonly known as:  PROTONIX  Take 1 tablet (40 mg  total) by mouth daily before breakfast.       cyanocobalamin injection thousand milligrams IM monthly      Discharge Orders   Future Appointments Provider Department Dept Phone   08/23/2012 9:00 AM Salley Scarlet, MD [facility] [phone]   Future Orders Complete By Expires     Diet - low sodium heart healthy  As directed     Walk with assistance  As directed        Disposition: SNF  Discharged Condition: stable  Consults:  PT  Labs:    Sodium  143 144        Sodium   Potassium  3.0 3.7         Potassium   Chloride  106 109        Chloride   CO2  27 26        CO2   BUN  18 14        BUN   Creatinine, Ser  0.71 0.63        Creatinine, Ser   Calcium  11.1 10.7        Calcium   GFR calc non Af Amer  76 79  GFR calc non Af Amer   GFR calc Af Amer  88  90 mL/min  The eGFR has been calculated using the CKD EPI equation. This calculation has not been validated in all clinical situations. eGFR's persistently <90 mL/min signify possible Chronic Kidney Disease.">9090 mL/min  The eGFR has been calculated using the CKD EPI equation. This calculation has not been validated in all clinical situations. eGFR's persistently <90 mL/min signify possible Chronic Kidney Disease." border=0 src="[email or web address]://[email or web address]:/PROGRAM%20FILES%20([email or web address]/V7.9/EN-US/Images/[email or web address]_[email or web address]" width=5 height=10        GFR calc Af Amer   Glucose, Bld  92 123        Glucose, Bld   Magnesium  2.0         Magnesium   Alkaline Phosphatase  57 59        Alkaline Phosphatase   Albumin  3.5 3.5        Albumin   AST  15 15        AST   ALT  7 6        ALT   Total Protein  6.3 6.2        Total Protein   Total Bilirubin  0.2 0.2        Total Bilirubin    CARDIAC PROFILE     Troponin I  <0.30          Troponin I   Pro B Natriuretic peptide (BNP)  2072.0         Pro B Natriuretic peptide (BNP)    CBC     WBC  6.2 5.5        WBC   RBC  3.96 4.24        RBC   Hemoglobin  11.7 12.4         Hemoglobin   HCT  36.3 38.7        HCT   MCV  91.7 91.3        MCV   MCH  29.5 29.2        MCH   MCHC  32.2 32.0        MCHC   RDW  14.4 14.4        RDW   Platelets  271 290        Platelets    DIFFERENTIAL     Neutrophils Relative  58         Neutrophils Relative   Lymphocytes Relative  23         Lymphocytes Relative   Monocytes Relative  13         Monocytes Relative   Eosinophils Relative  6         Eosinophils Relative   Basophils Relative  1         Basophils Relative   Neutro Abs  3.6         Neutro Abs   Lymphs Abs  1.4         Lymphs Abs   Monocytes Absolute  0.8         Monocytes Absolute   Eosinophils Absolute  0.3         Eosinophils Absolute   Basophils Absolute  0.0         Basophils Absolute    DIABETES     Glucose, Bld  92 123        Glucose, Bld    THYROID     TSH  1.876         TSH  URINALYSIS     Color, Urine  YELLOW         Color, Urine   APPearance  HAZY         APPearance   Specific Gravity, Urine  1.015         Specific Gravity, Urine   pH  8.0         pH   Glucose, UA  NEGATIVE         Glucose, UA   Bilirubin Urine  NEGATIVE         Bilirubin Urine   Ketones, ur  NEGATIVE         Ketones, ur   Protein, ur  NEGATIVE         Protein, ur   Urobilinogen, UA  0.2         Urobilinogen, UA   Nitrite  POSITIVE         Nitrite   Leukocytes, UA  NEGATIVE         Leukocytes, UA   Hgb urine dipstick  TRACE         Hgb urine dipstick   WBC, UA  7-10         WBC, UA   RBC / HPF  3-6         RBC / HPF   Squamous Epithelial / LPF  FEW         Squamous Epithelial / LPF   Bacteria, UA  MANY            Diagnostics:  Ct Head Wo Contrast  08/14/2012  *RADIOLOGY REPORT*  Clinical Data: Hypertension.  Meningioma.  CT HEAD WITHOUT CONTRAST  Technique:  Contiguous axial images were obtained from the base of the skull through the vertex without contrast.  Comparison: 04/17/2012  Findings: Right frontal meningioma, 2.25 cm in width (formerly 2.4 cm) with considerable  associated vasogenic edema in the right frontal lobe similar to prior.  There is 10 mm of stable right to left midline shift along the frontal lobes.  Brain stem, cerebellum, cerebral peduncles, thalami, and basal ganglia remain intact. Periventricular and corona radiata white matter hypodensities are most compatible with chronic ischemic microvascular white matter disease.  There is mild effacement of the frontal horn the right lateral ventricle, similar to prior.  No intraventricular hemorrhage or acute CVA noted. The visualized paranasal sinuses appear clear.  IMPRESSION:  1.  Essentially stable right frontal meningioma associated with considerable chronic right frontal lobe vasogenic edema. There is 10 mm of stable right-to-left midline shift.   Original Report Authenticated By: Gaylyn Rong, M.D.    Dg Chest Portable 1 View  08/14/2012  *RADIOLOGY REPORT*  Clinical Data: Chest pain, weakness, dizziness, nausea  PORTABLE CHEST - 1 VIEW  Comparison: 04/15/2012  Findings: The patient is rotated to the right on today's exam, resulting in reduced diagnostic sensitivity and specificity. Tortuous and atherosclerotic aorta is again noted with cardiomegaly and dextroconvex lower thoracic scoliosis.  There is evidence old granulomatous disease and emphysema.  Cephalized blood flow noted.  IMPRESSION:  1.  Emphysema with cardiomegaly and cephalization of blood flow suggesting mild congestive heart failure. 2.  Tortuous, atherosclerotic, and ectatic thoracic aorta.   Original Report Authenticated By: Gaylyn Rong, M.D.    EKG: Sinus rhythm with occasional Premature ventricular complexes and Premature atrial complexes Left axis deviation Incomplete right bundle branch block Left ventricular hypertrophy with repolarization abnormality  DNR   Hospital Course: See H&P for complete admission details. The  patient is an 79 year old white female with history of a large inoperable meningioma with chronic  vasogenic edema. She presented to the emergency room after her a syncopal episode on the commode. Family was concerned about the home situation as well. Her blood pressure was noted to be quite high in the emergency room ranging 150-197 systolic, 80-97 diastolic. She was cachectic. She was found to have hypokalemia and mild hypercalcemia. Also urinary tract infection. CT brain showed the chronic right frontal meningioma with vasogenic edema and stable 10 mm right to left midline shift. Patient was admitted, started on ceftriaxone. Her potassium was repleted. Initially she was started back on steroids, but developed some delirium and this was stopped. Her edema is a chronic finding. She was given IV fluids and her Lasix was held for a few days. Her blood pressure was poorly controlled and her metoprolol has been increased. Initially, patient's family was interested in hospice. She would qualify for same. Her CODE STATUS is DO NOT RESUSCITATE. They do not want any surgery on the meningioma. However, today he feels that she is not quite ready for hospice. Physical therapy has seen her and recommended short-term skilled nursing facility and she will be placed there. Total time on the day of discharge greater than 30 minutes.  Discharge Exam:  Blood pressure 150/63, pulse 61, temperature 97.7 F (36.5 C), temperature source Oral, resp. rate 20, height 5' (1.524 m), weight 46.2 kg (101 lb 13.6 oz), SpO2 97.00%.  Alert. Confused. Cooperative and pleasant. Lungs clear to auscultation bilaterally without wheeze rhonchi or rales Cardiovascular regular rate rhythm without murmurs gallops rubs  Signed: Purvi Ruehl L 08/17/2012, 9:45 AM

## 2012-08-22 ENCOUNTER — Telehealth: Payer: Self-pay | Admitting: Family Medicine

## 2012-08-23 ENCOUNTER — Ambulatory Visit: Payer: Medicare Other | Admitting: Family Medicine

## 2012-08-23 NOTE — Telephone Encounter (Signed)
Noted  

## 2012-09-13 ENCOUNTER — Encounter: Payer: Self-pay | Admitting: Gastroenterology

## 2012-09-14 ENCOUNTER — Ambulatory Visit (INDEPENDENT_AMBULATORY_CARE_PROVIDER_SITE_OTHER): Payer: Medicare Other | Admitting: Gastroenterology

## 2012-09-14 ENCOUNTER — Encounter: Payer: Self-pay | Admitting: Gastroenterology

## 2012-09-14 VITALS — BP 110/60 | HR 62 | Temp 97.4°F | Ht 65.0 in | Wt 90.4 lb

## 2012-09-14 DIAGNOSIS — R1310 Dysphagia, unspecified: Secondary | ICD-10-CM

## 2012-09-14 NOTE — Progress Notes (Signed)
Referring Provider: Salley Scarlet, MD Primary Care Physician:  Milinda Antis, MD Primary Gastroenterologist: Dr. Darrick Penna   Chief Complaint  Patient presents with  . Dysphagia    HPI:   Madison Hickman is an 79 year old pleasant female with a history of anemia noted during hospitalization in October 2013. She underwent an EGD by Dr. Darrick Penna with no obvious source for anemia, and she underwent empiric dilation. Path positive for H.pylori. Prevpac prescribed. Patient states she took this. Last CBC in February with normal Hgb.  Notes heavy feeling in chest associated with po intake, sometimes present, sometimes not. Sometimes has to spit out a bit of food. Sometimes feels like food won't move. Notes epigastric discomfort sometimes after eating. Good appetite. Occasional nausea. Protonix daily for reflux. No blood in stool.   Past Medical History  Diagnosis Date  . Back pain, chronic   . Headache   . Hypertension   . Arthritis   . Meningioma 04/15/2012  . IDA (iron deficiency anemia)   . Diastolic dysfunction Oct 2013    EF 50-55%   . Hypercalcemia     Past Surgical History  Procedure Laterality Date  . Abdominal hysterectomy    . Arm surgery    . Esophagogastroduodenoscopy  04/18/2012    JXB:JYNWG hiatal hernia/NO OBVIOUS SOURCE FOR PROFOUND ANEMIA IDENTIFIED.MODERATE GASTRITIS CONTRIBUTES TO ANEMIA/no definite stricture identified, empiric dilation performed. + H.PYLORI    Current Outpatient Prescriptions  Medication Sig Dispense Refill  . acetaminophen (TYLENOL) 325 MG tablet Take 2 tablets (650 mg total) by mouth every 6 (six) hours as needed.      . cyanocobalamin (,VITAMIN B-12,) 1000 MCG/ML injection Inject 1 mL (1,000 mcg total) into the muscle every 30 (thirty) days.  1 mL  0  . docusate sodium (COLACE) 100 MG capsule Take 1 capsule (100 mg total) by mouth 2 (two) times daily.  10 capsule  0  . enalapril (VASOTEC) 10 MG tablet Take 1 tablet (10 mg total) by mouth 2 (two) times  daily.  60 tablet  3  . ferrous sulfate 325 (65 FE) MG tablet Take 1 tablet (325 mg total) by mouth daily with breakfast.  30 tablet  3  . furosemide (LASIX) 20 MG tablet Take 1 tablet (20 mg total) by mouth daily.  30 tablet  3  . HYDROcodone-acetaminophen (NORCO/VICODIN) 5-325 MG per tablet Take 1 tablet by mouth every 4 (four) hours as needed for pain.  30 tablet  0  . loratadine (CLARITIN) 10 MG tablet Take 10 mg by mouth daily.      Marland Kitchen LORazepam (ATIVAN) 1 MG tablet Take 1 mg by mouth every 8 (eight) hours.      . metoprolol (LOPRESSOR) 50 MG tablet Take 1 tablet (50 mg total) by mouth 2 (two) times daily.      . pantoprazole (PROTONIX) 40 MG tablet Take 1 tablet (40 mg total) by mouth daily before breakfast.  30 tablet  3  . cefUROXime (CEFTIN) 500 MG tablet Take 1 tablet (500 mg total) by mouth 2 (two) times daily. For 2 more days      . haloperidol (HALDOL) 1 MG tablet Take 1 tablet (1 mg total) by mouth 2 (two) times daily as needed (agitation).  30 tablet  0   No current facility-administered medications for this visit.    Allergies as of 09/14/2012  . (No Known Allergies)    No family history on file.  History   Social History  . Marital Status: Widowed  Spouse Name: N/A    Number of Children: 3  . Years of Education: N/A   Occupational History  . retired Neurosurgeon    Social History Main Topics  . Smoking status: Never Smoker   . Smokeless tobacco: None  . Alcohol Use: No  . Drug Use: No  . Sexually Active: None   Other Topics Concern  . None   Social History Narrative   Lives w/ mentally-challenged son   2 other living children    Review of Systems: Gen: Denies fever, chills, anorexia. Denies fatigue, weakness, weight loss.  CV: +palpitations Resp: +DOE GI: SEE HPI Derm: Denies rash, itching, dry skin Psych: +mild depression, intermittent confusion.  Physical Exam: BP 110/60  Pulse 62  Temp(Src) 97.4 F (36.3 C) (Oral)  Ht 5\' 5"  (1.651 m)  Wt  90 lb 6.4 oz (41.005 kg)  BMI 15.04 kg/m2 General:   Alert and oriented. No distress noted. Pleasant and cooperative. Thin. Head:  Normocephalic and atraumatic. Eyes:  Conjuctiva clear without scleral icterus. Mouth:  Oral mucosa pink and moist.  Neck:  Supple, without mass or thyromegaly. Heart:  S1, S2 present without murmurs, rubs, or gallops. Regular rate and rhythm. Abdomen:  +BS, soft, non-tender and non-distended. No rebound or guarding. No HSM or masses noted. Msk:  kyphosis Extremities:  Without edema. Neurologic:  Alert and  oriented x4;  grossly normal neurologically. Skin:  Intact without significant lesions or rashes. Cervical Nodes:  No significant cervical adenopathy. Psych:  Alert and cooperative. Normal mood and affect.

## 2012-09-14 NOTE — Patient Instructions (Addendum)
We have scheduled an xray for you to look closer at your esophagus.  Further recommendations to follow in near future.

## 2012-09-16 DIAGNOSIS — R1310 Dysphagia, unspecified: Secondary | ICD-10-CM | POA: Insufficient documentation

## 2012-09-16 NOTE — Assessment & Plan Note (Signed)
79 year old pleasant female with history of empiric dilation in October 2013, now with recurrent intermittent dysphagia, sensation of epigastric discomfort at times with eating. Path positive for H.pylori on EGD, and Prevpac was prescribed. I would like to confirm this was actually provided for patient at the nursing facility. Need to rule out underlying esophageal motility disorder. Proceed with BPE in near future. May ultimately need repeat EGD with empiric dilation vs possible speech referral. Further recommendations to follow. Will contact nursing facility to verify patient received treatment for H.pylori. Consider stool antigen in near future.

## 2012-09-19 ENCOUNTER — Other Ambulatory Visit (HOSPITAL_COMMUNITY): Payer: Medicare Other

## 2012-09-19 NOTE — Progress Notes (Signed)
Faxed to PCP

## 2012-10-25 ENCOUNTER — Telehealth: Payer: Self-pay

## 2012-10-25 NOTE — Telephone Encounter (Signed)
Please have them schedule a visit for the office, I have only seen once

## 2012-10-25 NOTE — Telephone Encounter (Signed)
Gave verbal orders to PT for skilled nursing to evaluate and assess pt needs. She has redness and shiny appearance to her lower legs and strong odor to her urine. They will eval and follow up with [place]

## 2012-10-28 ENCOUNTER — Telehealth: Payer: Self-pay | Admitting: Family Medicine

## 2012-10-28 NOTE — Telephone Encounter (Signed)
Advised that needed appt. Haven't been here in awhile and needed to schedule. If needs eval she can go to ED

## 2012-10-28 NOTE — Telephone Encounter (Signed)
Nicky aware 

## 2012-10-31 ENCOUNTER — Encounter: Payer: Self-pay | Admitting: Family Medicine

## 2012-10-31 ENCOUNTER — Ambulatory Visit (INDEPENDENT_AMBULATORY_CARE_PROVIDER_SITE_OTHER): Payer: Medicare Other | Admitting: Family Medicine

## 2012-10-31 VITALS — BP 154/82 | HR 61 | Resp 16 | Wt 97.0 lb

## 2012-10-31 DIAGNOSIS — I509 Heart failure, unspecified: Secondary | ICD-10-CM

## 2012-10-31 DIAGNOSIS — I5032 Chronic diastolic (congestive) heart failure: Secondary | ICD-10-CM

## 2012-10-31 DIAGNOSIS — N390 Urinary tract infection, site not specified: Secondary | ICD-10-CM

## 2012-10-31 DIAGNOSIS — R609 Edema, unspecified: Secondary | ICD-10-CM

## 2012-10-31 DIAGNOSIS — R627 Adult failure to thrive: Secondary | ICD-10-CM

## 2012-10-31 DIAGNOSIS — I10 Essential (primary) hypertension: Secondary | ICD-10-CM

## 2012-10-31 LAB — POCT URINALYSIS DIPSTICK
Bilirubin, UA: NEGATIVE
Ketones, UA: NEGATIVE
pH, UA: 6.5

## 2012-10-31 MED ORDER — CEPHALEXIN 500 MG PO CAPS: 500.0000 mg | ORAL_CAPSULE | Freq: Two times a day (BID) | ORAL | Status: DC

## 2012-10-31 NOTE — Patient Instructions (Signed)
Restart the lasix take 20mg  once a day for the leg swelling Elevate your legs Take the antibiotics for the urine infection Your blood pressure will improve as the fluid goes down Try to eat small meals during the day Add ensure  F/U 1 week

## 2012-10-31 NOTE — Progress Notes (Signed)
  Subjective:    Patient ID: Madison Hickman, female    DOB: December 05, 1923, 79 y.o.   MRN: [id number]  HPI  Patient here to followup chronic medical problems. She was seen by a home health nurse who directed her to my office she has not been seen since her admission to the hospital ( syncope event) as she was in a skilled nursing facility for the past 60 days. Has concern about possible UTI she's had some dysuria however she does have urinary incontinence. She's also been complaining of leg pain and redness in her feet she has not been taking her Lasix she's only been taking her to blood pressure medications and her pain pill because the family was confused when they left the nursing facility. The nurse also check her blood pressure at home and it was 168/84. She states her appetite is good but her son states that she takes her most of her food her weight has actually increased since March the some of this is fluid  Review of Systems  GEN- denies fatigue, fever, weight loss,weakness, recent illness HEENT- denies eye drainage, change in vision, nasal discharge, CVS- denies chest pain, palpitations RESP- denies SOB, cough, wheeze ABD- denies N/V, change in stools, abd pain GU- + dysuria, hematuria, dribbling, incontinence MSK- + joint pain, muscle aches, injury Neuro- denies headache, dizziness, syncope, seizure activity      Objective:   Physical Exam  GEN- NAD, alert and oriented x3, cachetic appearing , repeat BP 150/68 HEENT- PERRL, EOMI, non injected sclera, pink conjunctiva, MMM, oropharynx clear Neck- Supple, no JVD CVS- RRR, no murmur RESP-CTAB ABS-NABS,soft,NT,ND EXT- 2+ pitting edema edema Pulses- Radial, DP- 2+         Assessment & Plan:

## 2012-11-02 DIAGNOSIS — I10 Essential (primary) hypertension: Secondary | ICD-10-CM | POA: Insufficient documentation

## 2012-11-02 NOTE — Assessment & Plan Note (Signed)
Uncontrolled, adding lasix back will improve

## 2012-11-02 NOTE — Assessment & Plan Note (Signed)
Venous stasis changes, restart lasix, recheck 1 week

## 2012-11-02 NOTE — Assessment & Plan Note (Signed)
No cardiopulmonary distress, restart lasix 20mg  daily

## 2012-11-02 NOTE — Assessment & Plan Note (Signed)
Maintaining weight.  

## 2012-11-02 NOTE — Assessment & Plan Note (Signed)
Antibiotics given 

## 2012-11-04 ENCOUNTER — Emergency Department (HOSPITAL_COMMUNITY): Payer: Medicare Other

## 2012-11-04 ENCOUNTER — Telehealth: Payer: Self-pay | Admitting: Family Medicine

## 2012-11-04 ENCOUNTER — Observation Stay (HOSPITAL_COMMUNITY): Payer: Medicare Other

## 2012-11-04 ENCOUNTER — Telehealth: Payer: Self-pay

## 2012-11-04 ENCOUNTER — Observation Stay (HOSPITAL_COMMUNITY)
Admission: EM | Admit: 2012-11-04 | Discharge: 2012-11-05 | Disposition: A | Discharge status: Home / Self Care | Payer: Medicare Other | Attending: Family Medicine | Admitting: Family Medicine

## 2012-11-04 ENCOUNTER — Encounter (HOSPITAL_COMMUNITY): Payer: Self-pay | Admitting: Emergency Medicine

## 2012-11-04 DIAGNOSIS — M549 Dorsalgia, unspecified: Secondary | ICD-10-CM | POA: Diagnosis present

## 2012-11-04 DIAGNOSIS — I498 Other specified cardiac arrhythmias: Principal | ICD-10-CM | POA: Insufficient documentation

## 2012-11-04 DIAGNOSIS — D329 Benign neoplasm of meninges, unspecified: Secondary | ICD-10-CM | POA: Diagnosis present

## 2012-11-04 DIAGNOSIS — N390 Urinary tract infection, site not specified: Secondary | ICD-10-CM | POA: Diagnosis present

## 2012-11-04 DIAGNOSIS — R1013 Epigastric pain: Secondary | ICD-10-CM | POA: Diagnosis present

## 2012-11-04 DIAGNOSIS — I509 Heart failure, unspecified: Secondary | ICD-10-CM | POA: Insufficient documentation

## 2012-11-04 DIAGNOSIS — I10 Essential (primary) hypertension: Secondary | ICD-10-CM | POA: Diagnosis present

## 2012-11-04 DIAGNOSIS — G8929 Other chronic pain: Secondary | ICD-10-CM | POA: Diagnosis present

## 2012-11-04 DIAGNOSIS — R609 Edema, unspecified: Secondary | ICD-10-CM | POA: Diagnosis present

## 2012-11-04 DIAGNOSIS — R001 Bradycardia, unspecified: Secondary | ICD-10-CM | POA: Diagnosis present

## 2012-11-04 DIAGNOSIS — G936 Cerebral edema: Secondary | ICD-10-CM | POA: Diagnosis present

## 2012-11-04 DIAGNOSIS — I5032 Chronic diastolic (congestive) heart failure: Secondary | ICD-10-CM | POA: Insufficient documentation

## 2012-11-04 DIAGNOSIS — R627 Adult failure to thrive: Secondary | ICD-10-CM | POA: Diagnosis present

## 2012-11-04 DIAGNOSIS — R0602 Shortness of breath: Secondary | ICD-10-CM | POA: Insufficient documentation

## 2012-11-04 DIAGNOSIS — R0789 Other chest pain: Secondary | ICD-10-CM | POA: Insufficient documentation

## 2012-11-04 DIAGNOSIS — D320 Benign neoplasm of cerebral meninges: Secondary | ICD-10-CM | POA: Insufficient documentation

## 2012-11-04 DIAGNOSIS — G9389 Other specified disorders of brain: Secondary | ICD-10-CM

## 2012-11-04 DIAGNOSIS — R600 Localized edema: Secondary | ICD-10-CM | POA: Diagnosis present

## 2012-11-04 LAB — CBC WITH DIFFERENTIAL/PLATELET
Lymphocytes Relative: 23 % (ref 12–46)
Lymphs Abs: 1.5 10*3/uL (ref 0.7–4.0)
MCV: 92.7 fL (ref 78.0–100.0)
Neutro Abs: 4.2 10*3/uL (ref 1.7–7.7)
Neutrophils Relative %: 65 % (ref 43–77)
Platelets: 282 10*3/uL (ref 150–400)
RBC: 4.13 MIL/uL (ref 3.87–5.11)
WBC: 6.5 10*3/uL (ref 4.0–10.5)

## 2012-11-04 LAB — PRO B NATRIURETIC PEPTIDE: Pro B Natriuretic peptide (BNP): 1050 pg/mL — ABNORMAL HIGH (ref 0–450)

## 2012-11-04 LAB — POCT I-STAT, CHEM 8
Calcium, Ion: 1.45 mmol/L — ABNORMAL HIGH (ref 1.13–1.30)
Chloride: 110 mEq/L (ref 96–112)
Glucose, Bld: 91 mg/dL (ref 70–99)
HCT: 38 % (ref 36.0–46.0)
Hemoglobin: 12.9 g/dL (ref 12.0–15.0)
Potassium: 3.7 mEq/L (ref 3.5–5.1)

## 2012-11-04 LAB — BASIC METABOLIC PANEL
CO2: 26 mEq/L (ref 19–32)
Calcium: 11 mg/dL — ABNORMAL HIGH (ref 8.4–10.5)
GFR calc non Af Amer: 74 mL/min — ABNORMAL LOW (ref >90)
Sodium: 141 mEq/L (ref 135–145)

## 2012-11-04 LAB — TROPONIN I: Troponin I: <0.3 ng/mL (ref <0.30)

## 2012-11-04 LAB — APTT: aPTT: 26 seconds (ref 24–37)

## 2012-11-04 LAB — PROTIME-INR: Prothrombin Time: 13.2 seconds (ref 11.6–15.2)

## 2012-11-04 MED ORDER — TRAZODONE HCL 50 MG PO TABS: 25.0000 mg | ORAL_TABLET | Freq: Every evening | ORAL | Status: DC | PRN

## 2012-11-04 MED ORDER — DEXAMETHASONE SODIUM PHOSPHATE 4 MG/ML IJ SOLN
10.0000 mg | Freq: Once | INTRAMUSCULAR | Status: AC
  Administered 2012-11-04: 10 mg via INTRAVENOUS
  Filled 2012-11-04: qty 3

## 2012-11-04 MED ORDER — ONDANSETRON HCL 4 MG PO TABS: 4.0000 mg | ORAL_TABLET | Freq: Four times a day (QID) | ORAL | Status: DC | PRN

## 2012-11-04 MED ORDER — ACETAMINOPHEN 325 MG PO TABS: 650.0000 mg | ORAL_TABLET | Freq: Four times a day (QID) | ORAL | Status: DC | PRN

## 2012-11-04 MED ORDER — DOCUSATE SODIUM 100 MG PO CAPS
100.0000 mg | ORAL_CAPSULE | Freq: Two times a day (BID) | ORAL | Status: DC
  Administered 2012-11-04 – 2012-11-05 (×2): 100 mg via ORAL
  Filled 2012-11-04 (×2): qty 1

## 2012-11-04 MED ORDER — ASPIRIN 81 MG PO CHEW
324.0000 mg | CHEWABLE_TABLET | Freq: Once | ORAL | Status: AC
  Administered 2012-11-04: 324 mg via ORAL
  Filled 2012-11-04: qty 4

## 2012-11-04 MED ORDER — SODIUM CHLORIDE 0.9 % IV SOLN
Freq: Once | INTRAVENOUS | Status: AC
  Administered 2012-11-04: 13:00:00 via INTRAVENOUS

## 2012-11-04 MED ORDER — ENOXAPARIN SODIUM 30 MG/0.3ML ~~LOC~~ SOLN
30.0000 mg | SUBCUTANEOUS | Status: DC
  Administered 2012-11-04: 30 mg via SUBCUTANEOUS
  Filled 2012-11-04: qty 0.3

## 2012-11-04 MED ORDER — ONDANSETRON HCL 4 MG/2ML IJ SOLN: 4.0000 mg | Freq: Three times a day (TID) | INTRAMUSCULAR | Status: DC | PRN

## 2012-11-04 MED ORDER — ONDANSETRON HCL 4 MG/2ML IJ SOLN: 4.0000 mg | Freq: Four times a day (QID) | INTRAMUSCULAR | Status: DC | PRN

## 2012-11-04 MED ORDER — HYDRALAZINE HCL 20 MG/ML IJ SOLN
10.0000 mg | INTRAMUSCULAR | Status: AC
  Administered 2012-11-04: 10 mg via INTRAVENOUS
  Filled 2012-11-04: qty 1

## 2012-11-04 MED ORDER — HYDROCODONE-ACETAMINOPHEN 5-325 MG PO TABS
1.0000 | ORAL_TABLET | ORAL | Status: DC | PRN
  Administered 2012-11-04 – 2012-11-05 (×2): 1 via ORAL
  Filled 2012-11-04 (×2): qty 1

## 2012-11-04 MED ORDER — CEPHALEXIN 500 MG PO CAPS
500.0000 mg | ORAL_CAPSULE | Freq: Two times a day (BID) | ORAL | Status: DC
  Administered 2012-11-04 – 2012-11-05 (×2): 500 mg via ORAL
  Filled 2012-11-04 (×2): qty 1

## 2012-11-04 MED ORDER — PANTOPRAZOLE SODIUM 40 MG PO TBEC
40.0000 mg | DELAYED_RELEASE_TABLET | Freq: Every day | ORAL | Status: DC
  Administered 2012-11-04 – 2012-11-05 (×2): 40 mg via ORAL
  Filled 2012-11-04 (×2): qty 1

## 2012-11-04 MED ORDER — ENALAPRIL MALEATE 5 MG PO TABS
10.0000 mg | ORAL_TABLET | Freq: Two times a day (BID) | ORAL | Status: DC
  Administered 2012-11-04 – 2012-11-05 (×2): 10 mg via ORAL
  Filled 2012-11-04 (×2): qty 2

## 2012-11-04 NOTE — ED Notes (Signed)
Pt's urine sample is placed in the blue bin.

## 2012-11-04 NOTE — H&P (Signed)
[facility] History and Physical  Madison Hickman  ZOX:[id number]  DOB: [birth year]   DOA: 11/04/2012   PCP:   Milinda Antis, MD   Chief Complaint:  Hypertension and bradycardia, noted today  HPI: Madison Hickman is an 79 y.o. female.   Frail elderly Caucasian lady, well-known to the hospitalist service, with a meningioma with vasogenic edema and midline shift in the brain, stable for over 5 years. She is not a surgical candidate and has previously been referred to hospice, but describes herself as "not hospice eligible"because she doesn't have Medicaid. In fact her income is too high to qualify for Medicaid, so she chooses to be managed at home and has a visiting home health nurse.  She is known to have hypertension and bradycardia, has a normal reaction to her chronically elevated raised intracranial pressure, to maintain brain perfusion.  She has previously been prescribed Lasix for presumed CHF, but does not take it because she says it interferes with her kidney function, and she doesn't like the way it makes her feel. She recently visited her primary care physician for dysuria, and lower extremity edema, and was prescribed Keflex for UTI and Lasix for the edema. She has chosen to be noncompliant with the Lasix, because of her past experience.  She also suffers with dyspepsia after eating for which she takes TUMS She usually ambulates with the assistance of a cane. She denies dizziness lightheadedness or any other orthostatic symptoms.  She was visited by her nurse today who noted hypertension and a pulse in the 40s and advised her to come to the emergency room. Her daughter brought her to the emergency room. She was noted to have systolic hypertension;, her pulse in the 50s. Hospitalist service was called to assist.  Both she and her daughter who is present feels that she is pretty much at her baseline.   Rewiew of Systems:   All systems negative except as marked bold or noted in  the HPI;  Constitutional:    malaise, fever and chills. ;  Eyes:   eye pain, redness and discharge. ;  poor vision right eye  ENMT:   ear pain, hoarseness, nasal congestion, sinus pressure and sore throat. ;  Cardiovascular:    chest pain, palpitations, diaphoresis, dyspnea and peripheral edema.  Respiratory:   cough, hemoptysis, wheezing and stridor. ;  Gastrointestinal:  nausea, vomiting, diarrhea, constipation, abdominal pain, melena, blood in stool, hematemesis, jaundice and rectal bleeding. unusual weight loss..   Genitourinary:    frequency, dysuria, incontinence,flank pain and hematuria; Musculoskeletal:   back pain and neck pain.  swelling and trauma.;  right knee pain; right ankle some  Skin: .  pruritus, rash, abrasions, bruising and skin lesion.; ulcerations Neuro:    headache, lightheadedness and neck stiffness.  weakness, altered level of consciousness, altered mental status, extremity weakness, burning feet, involuntary movement, seizure and syncope.  Psych:    anxiety, depression, insomnia, tearfulness, panic attacks, hallucinations, paranoia, suicidal or homicidal ideation    Past Medical History  Diagnosis Date  . Back pain, chronic   . Headache   . Hypertension   . Arthritis   . Meningioma 04/15/2012  . IDA (iron deficiency anemia)   . Diastolic dysfunction Oct 2013    EF 50-55%   . Hypercalcemia     Past Surgical History  Procedure Laterality Date  . Abdominal hysterectomy    . Arm surgery    . Esophagogastroduodenoscopy  04/18/2012    WJX:[id number] hiatal hernia/NO OBVIOUS  SOURCE FOR PROFOUND ANEMIA IDENTIFIED.MODERATE GASTRITIS CONTRIBUTES TO ANEMIA/no definite stricture identified, empiric dilation performed. + H.PYLORI    Medications:  HOME MEDS: Prior to Admission medications   Medication Sig Start Date End Date Taking? Authorizing Provider  acetaminophen (TYLENOL) 325 MG tablet Take 2 tablets (650 mg total) by mouth every 6 (six) hours as needed. 08/17/12   Yes Christiane Ha, MD  cephALEXin (KEFLEX) 500 MG capsule Take 1 capsule (500 mg total) by mouth 2 (two) times daily. 10/31/12 11/10/12 Yes Salley Scarlet, MD  docusate sodium (COLACE) 100 MG capsule Take 1 capsule (100 mg total) by mouth 2 (two) times daily. 08/17/12  Yes Christiane Ha, MD  enalapril (VASOTEC) 10 MG tablet Take 1 tablet (10 mg total) by mouth 2 (two) times daily. 06/21/12  Yes Salley Scarlet, MD  HYDROcodone-acetaminophen (NORCO/VICODIN) 5-325 MG per tablet Take 1 tablet by mouth every 4 (four) hours as needed for pain. 08/17/12  Yes Christiane Ha, MD  metoprolol tartrate (LOPRESSOR) 25 MG tablet Take 25 mg by mouth 2 (two) times daily.   Yes Historical Provider, MD  pantoprazole (PROTONIX) 40 MG tablet Take 40 mg by mouth daily.   Yes Historical Provider, MD  furosemide (LASIX) 20 MG tablet Take 1 tablet (20 mg total) by mouth daily. 06/21/12 06/21/13  Salley Scarlet, MD     Allergies:  No Known Allergies  Social History:   reports that she has never smoked. She does not have any smokeless tobacco history on file. She reports that she does not drink alcohol or use illicit drugs.  Family History: No family history on file.   Physical Exam: Filed Vitals:   11/04/12 1600 11/04/12 1606 11/04/12 1809 11/04/12 2103  BP: 187/83 187/83 177/83 135/81  Pulse: 56 57 67 84  Temp:   98.1 F (36.7 C) 98.1 F (36.7 C)  TempSrc:   Axillary Oral  Resp: 25 24 20 20   Height:   5\' 7"  (1.702 m)   Weight:   42.8 kg (94 lb 5.7 oz)   SpO2: 99% 100% 90% 100%   Blood pressure 135/81, pulse 84, temperature 98.1 F (36.7 C), temperature source Oral, resp. rate 20, height 5\' 7"  (1.702 m), weight 42.8 kg (94 lb 5.7 oz), SpO2 100.00%.  GEN:  Pleasant, alert, but frail looking elderly Caucasian lady  lying bed in no acute distress; cooperative with exam PSYCH:  alert and oriented x4;   neither anxious  nor depressed; affect is appropriate. HEENT: Mucous membranes pink and  anicteric; EOM intact; no cervical lymphadenopathy nor thyromegaly or carotid bruit; no JVD; Breasts:: Not examined CHEST WALL: No tenderness CHEST: Normal respiration, clear to auscultation bilaterally HEART:  bradycardia   regularly irregular  rhythm; no murmurs rubs or gallops BACK:  marked  kyphosis no scoliosis; no CVA tenderness ABDOMEN:  soft non-tender; no masses, no organomegaly, normal abdominal bowel sounds; no pannus; no intertriginous candida. Rectal Exam: Not done EXTREMITIES: age-appropriate arthropathy of the hands and knees; swollen nontender right ankle  ; no ulcerations. Genitalia: not examined PULSES: 2+ and symmetric SKIN: Normal hydration no rash or ulceration CNS: Cranial nerves 3-12 grossly intact no focal lateralizing neurologic deficit   Labs on Admission:  Basic Metabolic Panel:  Recent Labs Lab 11/04/12 1245 11/04/12 1509  NA 141 143  K 3.8 3.7  CL 106 110  CO2 26  --   GLUCOSE 94 91  BUN 17 15  CREATININE 0.74 0.70  CALCIUM 11.0*  --  Liver Function Tests: No results found for this basename: AST, ALT, ALKPHOS, BILITOT, PROT, ALBUMIN,  in the last 168 hours No results found for this basename: LIPASE, AMYLASE,  in the last 168 hours No results found for this basename: AMMONIA,  in the last 168 hours CBC:  Recent Labs Lab 11/04/12 1245 11/04/12 1509  WBC 6.5  --   NEUTROABS 4.2  --   HGB 12.4 12.9  HCT 38.3 38.0  MCV 92.7  --   PLT 282  --    Cardiac Enzymes:  Recent Labs Lab 11/04/12 1245  TROPONINI <0.30   BNP: No components found with this basename: POCBNP,  D-dimer: No components found with this basename: D-DIMER,  CBG: No results found for this basename: GLUCAP,  in the last 168 hours  Radiological Exams on Admission: Ct Head Wo Contrast  11/04/2012  *RADIOLOGY REPORT*  Clinical Data: Brain mass.  Hypertension.  CT HEAD WITHOUT CONTRAST  Technique:  Contiguous axial images were obtained from the base of the skull through  the vertex without contrast.  Comparison: 08/14/2012  Findings: 2.5 cm right frontal meningioma, densely calcified, with striking but stable vasogenic edema in the right frontal lobe. There is a faint effacement of the inferior portions of the right lateral ventricle frontal horn, as well as 10 mm of right-to-left midline shift in the frontal lobe in the vicinity of the mass.  Physiologic calcification of the basal ganglia noted.  Brain stem, cerebellum, thalami, and basal ganglia otherwise unremarkable.  No significant hydrocephalus  Periventricular and corona radiata white matter hypodensities are most compatible with chronic ischemic microvascular white matter disease.  No intracranial hemorrhage, mass lesion, or acute infarction is identified.  IMPRESSION:  1.  Stable appearance of 2.5 cm heavily calcified right frontal meningioma associated with considerable vasogenic edema and 10 mm of stable right-to-left midline shift in the frontal region.  No significant change from prior. 2. Periventricular and corona radiata white matter hypodensities are most compatible with chronic ischemic microvascular white matter disease.   Original Report Authenticated By: Gaylyn Rong, M.D.    Dg Chest Port 1 View  11/04/2012  *RADIOLOGY REPORT*  Clinical Data: 79 year old female with chest pain.  PORTABLE CHEST - 1 VIEW  Comparison: 08/14/2012 and earlier.  Findings: Portable upright AP view 1219 hours. There is cardiomegaly.  Other mediastinal contours are within normal limits. Stable tortuosity the thoracic aorta.  No pneumothorax or pulmonary edema.  No pleural effusion or consolidation.  Mild increased linear opacity in the lower right lung most resembles atelectasis. Osteopenia.  IMPRESSION: Mild right lung atelectasis, otherwise stable cardiomegaly and no acute cardiopulmonary abnormality.   Original Report Authenticated By: Erskine Speed, M.D.     EKG: Independently reviewed. Sinus bradycardia with marked sinus  arrhythmia   Assessment/Plan Present on Admission:  . Vasogenic cerebral edema . UTI (lower urinary tract infection) . Peripheral edema . Meningioma . Chronic back pain . Adult failure to thrive . Chronic reactive Hypertension . Chronic Reactive Bradycardia . Dyspepsia   PLAN: Continue course of Keflex for urinary tract infection; follow up sensitivities of the gram-negative rods in urine April 28  Since her hypotension and bradycardia are normal physiological reaction to her brain tumor, I don't think it needs to be treated, but we will observe overnight. We will discontinue beta blockers for hypertension and consider substituting something else if blood pressure is sustained greater than 220/120, or she becomes symptomatic.  Her  brain natruretic peptide is elevated, but neither  physical exam  nor chest x-ray suggests heart failure; therefore suspect this is a false-positive related to her brain tumor. Will not resume Lasix, it is since she has indicated she does not want it.  Resumed PPI for dyspepsia; her daughter says Zantac hasn't helped her in the past.  Continue narcotics for her chronic pain   Other plans as per orders.  Code Status: DO NOT RESUSCITATE  Family Communication:  Plans discussed with daughter and patient at bedside  Disposition Plan: Probably home in the morning if stable overnight.    Biridiana Twardowski Nocturnist [facility] Pager [phone]   11/04/2012, 9:10 PM

## 2012-11-04 NOTE — Telephone Encounter (Signed)
Same as my suggestion earlier, noted and agree

## 2012-11-04 NOTE — Telephone Encounter (Signed)
PT called and states patients pulse was in the 40s. After exercise it went up to 65. After she rested it went back to 42-44. I advised ED but they state she has a history of low pulse and she felt just fine. I told them if anything changed to go to ED and I would let the Dr know

## 2012-11-04 NOTE — ED Provider Notes (Signed)
History     This chart was scribed for Vida Roller, MD, MD by Smitty Pluck, ED Scribe. The patient was seen in room APA05/APA05 and the patient's care was started at 12:14 PM.   CSN: [id number]  Arrival date & time 11/04/12  1140      Chief Complaint  Patient presents with  . Chest Pain  . Shortness of Breath     The history is provided by the patient, medical records and a relative. No language interpreter was used.   Madison Hickman is a 79 y.o. female with hx of HTN, IDA, diastolic dysfunction and MI who presents to the Emergency Department complaining of constant, mild SOB onset today associated with a heavy feeling in her chest. Pt reports that she has increased SOB while eating. Pt reports having heaviness sensation in chest. She mentions having soreness in fingers of right hand but denies having recently injuries or trauma to right hand. Relative states that therapist stated pt's pulse rate was in 40s today and after exercise it increased to 60s. Pt is currently taking abx for bladder infection. Pt denies fever, numbness, chills, vomiting, diarrhea, weakness, cough, SOB and any other pain.   Past Medical History  Diagnosis Date  . Back pain, chronic   . Headache   . Hypertension   . Arthritis   . Meningioma 04/15/2012  . IDA (iron deficiency anemia)   . Diastolic dysfunction Oct 2013    EF 50-55%   . Hypercalcemia     Past Surgical History  Procedure Laterality Date  . Abdominal hysterectomy    . Arm surgery    . Esophagogastroduodenoscopy  04/18/2012    YQM:VHQIO hiatal hernia/NO OBVIOUS SOURCE FOR PROFOUND ANEMIA IDENTIFIED.MODERATE GASTRITIS CONTRIBUTES TO ANEMIA/no definite stricture identified, empiric dilation performed. + H.PYLORI    No family history on file.  History  Substance Use Topics  . Smoking status: Never Smoker   . Smokeless tobacco: Not on file  . Alcohol Use: No    OB History   Grav Para Term Preterm Abortions TAB SAB Ect Mult Living                   Review of Systems  Respiratory: Positive for shortness of breath.   Cardiovascular: Positive for chest pain.  All other systems reviewed and are negative.    Allergies  Review of patient's allergies indicates no known allergies.  Home Medications   Current Outpatient Rx  Name  Route  Sig  Dispense  Refill  . acetaminophen (TYLENOL) 325 MG tablet   Oral   Take 2 tablets (650 mg total) by mouth every 6 (six) hours as needed.         . cephALEXin (KEFLEX) 500 MG capsule   Oral   Take 1 capsule (500 mg total) by mouth 2 (two) times daily.   10 capsule   0   . docusate sodium (COLACE) 100 MG capsule   Oral   Take 1 capsule (100 mg total) by mouth 2 (two) times daily.   10 capsule   0   . enalapril (VASOTEC) 10 MG tablet   Oral   Take 1 tablet (10 mg total) by mouth 2 (two) times daily.   60 tablet   3   . HYDROcodone-acetaminophen (NORCO/VICODIN) 5-325 MG per tablet   Oral   Take 1 tablet by mouth every 4 (four) hours as needed for pain.   30 tablet   0   . metoprolol  tartrate (LOPRESSOR) 25 MG tablet   Oral   Take 25 mg by mouth 2 (two) times daily.         . pantoprazole (PROTONIX) 40 MG tablet   Oral   Take 40 mg by mouth daily.         . furosemide (LASIX) 20 MG tablet   Oral   Take 1 tablet (20 mg total) by mouth daily.   30 tablet   3     BP 193/107  Pulse 51  Temp(Src) 97.7 F (36.5 C) (Oral)  Resp 17  Ht 5\' 7"  (1.702 m)  Wt 97 lb (43.999 kg)  BMI 15.19 kg/m2  SpO2 96%  Physical Exam  Nursing note and vitals reviewed. Constitutional: She is oriented to person, place, and time. She appears well-developed and well-nourished. No distress.  HENT:  Head: Normocephalic and atraumatic.  Eyes: EOM are normal. Pupils are equal, round, and reactive to light.  Neck: Normal range of motion. Neck supple. No tracheal deviation present.  Cardiovascular: Regular rhythm and normal heart sounds.  Bradycardia present.   No murmur  heard. Strong radial artery pulses  Pulmonary/Chest: Effort normal. No respiratory distress. She has no wheezes. She has rales (at bases bilaterally).  Mild tachypnea   Abdominal: Soft. She exhibits no distension.  Musculoskeletal: Normal range of motion.  Neurological: She is alert and oriented to person, place, and time.  Skin: Skin is warm and dry.  Psychiatric: She has a normal mood and affect. Her behavior is normal.    ED Course  Procedures (including critical care time) DIAGNOSTIC STUDIES: Oxygen Saturation is 92% on room air, normal by my interpretation.    COORDINATION OF CARE: 12:19 PM Discussed ED treatment with pt and pt agrees.     Labs Reviewed  CBC WITH DIFFERENTIAL - Abnormal; Notable for the following:    RDW 16.0 (*)    All other components within normal limits  POCT I-STAT, CHEM 8 - Abnormal; Notable for the following:    Calcium, Ion 1.45 (*)    All other components within normal limits  APTT  PROTIME-INR  TROPONIN I  BASIC METABOLIC PANEL  PRO B NATRIURETIC PEPTIDE   Dg Chest Port 1 View  11/04/2012  *RADIOLOGY REPORT*  Clinical Data: 79 year old female with chest pain.  PORTABLE CHEST - 1 VIEW  Comparison: 08/14/2012 and earlier.  Findings: Portable upright AP view 1219 hours. There is cardiomegaly.  Other mediastinal contours are within normal limits. Stable tortuosity the thoracic aorta.  No pneumothorax or pulmonary edema.  No pleural effusion or consolidation.  Mild increased linear opacity in the lower right lung most resembles atelectasis. Osteopenia.  IMPRESSION: Mild right lung atelectasis, otherwise stable cardiomegaly and no acute cardiopulmonary abnormality.   Original Report Authenticated By: Erskine Speed, M.D.      1. Hypertension   2. Bradycardia   3. Brain mass       MDM  The patient is dyspneic, also has mild chest pain which is concerning for a number of reasons however clinically the patient has only bradycardia but no abnormal lung  sounds. There are strong pulses at the radial arteries and she does not appear to be in distress. Her EKG shows sinus bradycardia with T wave and ST abnormalities in the inferior leads but this is consistent with a prior EKG from February of 2014. She will need further evaluation with x-ray, laboratory workup and likely admission to the hospital for bradycardia.  ED ECG REPORT  I personally interpreted this EKG   Date: 11/04/2012   Rate: 51  Rhythm: sinus bradycardia  QRS Axis: normal  Intervals: normal  ST/T Wave abnormalities: nonspecific ST/T changes  Conduction Disutrbances:none  Narrative Interpretation:   Old EKG Reviewed: unchanged c/w 08/14/12  Pt has had ongoing bradycardia - appears to be a SB, no signs of significant tachycarrhythmias, she has no fever, no sob and her CXR according to my interpretation shows no acute findings.  She has normal blood counts and normal trop and chemistry - Review of her medical record shows that she has had vasogenic edema surrounding a R sided meningioma and has been told that she should not have surgery on this in the past as she was a poor surgical candidate.  I have treated her BP with IV hydralazine and ordered decadron for the swelling and edema which may be contributing to her sx.  She has had minimal improvement, she has current BP of 199 / 110 and will require admission for further treatment.  CT ordered to gauge progression of brain mass, med surg bed requested by IM.  I personally performed the services described in this documentation, which was scribed in my presence. The recorded information has been reviewed and is accurate.      Vida Roller, MD 11/04/12 [phone]

## 2012-11-04 NOTE — ED Notes (Signed)
Report given to floor, will notify when in route after ct of head

## 2012-11-04 NOTE — ED Notes (Signed)
Pt c/o sob and chest pressure this am.

## 2012-11-05 DIAGNOSIS — G936 Cerebral edema: Secondary | ICD-10-CM

## 2012-11-05 DIAGNOSIS — I5032 Chronic diastolic (congestive) heart failure: Secondary | ICD-10-CM

## 2012-11-05 DIAGNOSIS — G939 Disorder of brain, unspecified: Secondary | ICD-10-CM

## 2012-11-05 DIAGNOSIS — I509 Heart failure, unspecified: Secondary | ICD-10-CM

## 2012-11-05 LAB — URINE CULTURE

## 2012-11-05 NOTE — Discharge Summary (Signed)
Physician Discharge Summary  Madison Hickman UJW:[id number] DOB: [birth year] DOA: 11/04/2012  PCP: Madison Antis, MD  Admit date: 11/04/2012 Discharge date: 11/05/2012  Recommendations for Outpatient Follow-up:  1. Followup blood pressure, hypertension off metoprolol  2. Follow up compliance with Lasix  Follow-up Information   Follow up with Madison Antis, MD In 1 week.   Contact information:   [address] [place] [address] [phone]      Discharge Diagnoses:  1. Sinus bradycardia 2. Meningioma with chronic vasogenic edema 3. Chronic hypertension and bradycardia 4. Chronic diastolic congestive heart failure  Discharge Condition: Improved Disposition: Return home  Diet recommendation: Regular  Filed Weights   11/04/12 1149 11/04/12 1809  Weight: 43.999 kg (97 lb) 42.8 kg (94 lb 5.7 oz)    History of present illness:  79 year old woman presented with bradycardia, shortness of breath and was admitted for observation.  Hospital Course:  Madison Hickman was admitted for observation given her bradycardia, hypertension and mild shortness of breath. Metoprolol was discontinued and bradycardia has resolved. Hypertension well-controlled on lisinopril.No evidence of heart failure on exam or by radiography. Bradycardia multifactorial as below. She is asymptomatic and stable for discharge. See individual issues as below.  1. Sinus bradycardia: Multifactorial--possible Cushing's reflex complicated by beta blocker. Stable on beta blocker. No chest pain or shortness of breath--discomfort described yesterday likely secondary to dyspepsia. 2. Meningioma with vasogenic edema: Stable for over 5 years. Not a surgical candidate. Describes herself as "not hospice eligible" because she doesn't have Medicaid. In fact her income is too high to qualify for Medicaid, so she chooses to be managed at home and has a visiting home health nurse 3. Recent diagnosis of UTI: Continue  Keflex. 4. Chronic hypertension and bradycardia: Likely Cushing's reflex. Blood pressure stable off metoprolol.   5. Chronic diastolic congestive heart failure: Continue Lasix as tolerated  Consultants:  None  Procedures:  None  Discharge Instructions  Discharge Orders   Future Appointments Provider Department Dept Phone   11/07/2012 10:45 AM Madison Scarlet, MD [facility] [phone]   Future Orders Complete By Expires     Activity as tolerated - No restrictions  As directed     Diet general  As directed     Discharge instructions  As directed     Comments:      Followup with your primary care physician as directed. It is recommended that you stop metoprolol as this can lower the heart rate. Call your physician or seek immediate medical attention for shortness of breath or worsening of condition.        Medication List    STOP taking these medications       metoprolol tartrate 25 MG tablet  Commonly known as:  LOPRESSOR      TAKE these medications       acetaminophen 325 MG tablet  Commonly known as:  TYLENOL  Take 2 tablets (650 mg total) by mouth every 6 (six) hours as needed.     cephALEXin 500 MG capsule  Commonly known as:  KEFLEX  Take 1 capsule (500 mg total) by mouth 2 (two) times daily.     docusate sodium 100 MG capsule  Commonly known as:  COLACE  Take 1 capsule (100 mg total) by mouth 2 (two) times daily.     enalapril 10 MG tablet  Commonly known as:  VASOTEC  Take 1 tablet (10 mg total) by mouth 2 (two) times daily.  furosemide 20 MG tablet  Commonly known as:  LASIX  Take 1 tablet (20 mg total) by mouth daily.     HYDROcodone-acetaminophen 5-325 MG per tablet  Commonly known as:  NORCO/VICODIN  Take 1 tablet by mouth every 4 (four) hours as needed for pain.     pantoprazole 40 MG tablet  Commonly known as:  PROTONIX  Take 40 mg by mouth daily.       No Known Allergies  The results of significant diagnostics from this  hospitalization (including imaging, microbiology, ancillary and laboratory) are listed below for reference.    Significant Diagnostic Studies: Ct Head Wo Contrast  11/04/2012  *RADIOLOGY REPORT*  Clinical Data: Brain mass.  Hypertension.  CT HEAD WITHOUT CONTRAST  Technique:  Contiguous axial images were obtained from the base of the skull through the vertex without contrast.  Comparison: 08/14/2012  Findings: 2.5 cm right frontal meningioma, densely calcified, with striking but stable vasogenic edema in the right frontal lobe. There is a faint effacement of the inferior portions of the right lateral ventricle frontal horn, as well as 10 mm of right-to-left midline shift in the frontal lobe in the vicinity of the mass.  Physiologic calcification of the basal ganglia noted.  Brain stem, cerebellum, thalami, and basal ganglia otherwise unremarkable.  No significant hydrocephalus  Periventricular and corona radiata white matter hypodensities are most compatible with chronic ischemic microvascular white matter disease.  No intracranial hemorrhage, mass lesion, or acute infarction is identified.  IMPRESSION:  1.  Stable appearance of 2.5 cm heavily calcified right frontal meningioma associated with considerable vasogenic edema and 10 mm of stable right-to-left midline shift in the frontal region.  No significant change from prior. 2. Periventricular and corona radiata white matter hypodensities are most compatible with chronic ischemic microvascular white matter disease.   Original Report Authenticated By: Gaylyn Rong, M.D.    Dg Chest Port 1 View  11/04/2012  *RADIOLOGY REPORT*  Clinical Data: 79 year old female with chest pain.  PORTABLE CHEST - 1 VIEW  Comparison: 08/14/2012 and earlier.  Findings: Portable upright AP view 1219 hours. There is cardiomegaly.  Other mediastinal contours are within normal limits. Stable tortuosity the thoracic aorta.  No pneumothorax or pulmonary edema.  No pleural effusion or  consolidation.  Mild increased linear opacity in the lower right lung most resembles atelectasis. Osteopenia.  IMPRESSION: Mild right lung atelectasis, otherwise stable cardiomegaly and no acute cardiopulmonary abnormality.   Original Report Authenticated By: Erskine Speed, M.D.     Microbiology: Recent Results (from the past 240 hour(s))  URINE CULTURE     Status: None   Collection Time    10/31/12  3:30 PM      Result Value Range Status   Colony Count >=100,000 COLONIES/ML   Preliminary   Preliminary Report Gram Negative Rods   Preliminary     Labs: Basic Metabolic Panel:  Recent Labs Lab 11/04/12 1245 11/04/12 1509  NA 141 143  K 3.8 3.7  CL 106 110  CO2 26  --   GLUCOSE 94 91  BUN 17 15  CREATININE 0.74 0.70  CALCIUM 11.0*  --    CBC:  Recent Labs Lab 11/04/12 1245 11/04/12 1509  WBC 6.5  --   NEUTROABS 4.2  --   HGB 12.4 12.9  HCT 38.3 38.0  MCV 92.7  --   PLT 282  --    Cardiac Enzymes:  Recent Labs Lab 11/04/12 1245  TROPONINI <0.30    Recent Labs  04/15/12 0726 08/14/12 1802 11/04/12 1245  PROBNP 5133.0* 2072.0* 1050.0*    Active Problems:   Chronic back pain   Meningioma   Peripheral edema   Adult failure to thrive   Vasogenic cerebral edema   UTI (lower urinary tract infection)   Chronic reactive Hypertension   Chronic Reactive Bradycardia   Dyspepsia   Time coordinating discharge: 25 minutes  Signed:  Brendia Sacks, MD [facility] 11/05/2012, 11:45 AM

## 2012-11-05 NOTE — Progress Notes (Signed)
D/c instructions reviewed with patient and son.  Verbalized understanding.  Pt dc'd to home with son.  Schonewitz, Candelaria Stagers 11/05/2012

## 2012-11-05 NOTE — Progress Notes (Signed)
[facility] PROGRESS NOTE  Madison Hickman UJW:[id number] DOB: 1924-01-08 DOA: 11/04/2012 PCP: Milinda Antis, MD  Assessment/Plan: 1. Sinus bradycardia: Multifactorial--possible Cushing's reflex complicated by beta blocker. Stable on beta blocker. No chest pain or shortness of breath--discomfort described yesterday likely secondary to dyspepsia. 2. Meningioma with vasogenic edema: Stable for over 5 years. Not a surgical candidate. Describes herself as "not hospice eligible" because she doesn't have Medicaid. In fact her income is too high to qualify for Medicaid, so she chooses to be managed at home and has a visiting home health nurse 3. Recent diagnosis of UTI: Continue Keflex. 4. Chronic hypertension and bradycardia: Likely Cushing's reflex. Blood pressure stable off metoprolol.  5. Chronic diastolic congestive heart failure: Continue Lasix as tolerated  Stable. Plan discharge today. Discussed with son at bedside.  Code Status: DO NOT RESUSCITATE Family Communication: As above Disposition Plan: Home today  Brendia Sacks, MD  [facility]  Pager [phone] If 7PM-7AM, please contact night-coverage at [email or web address], password [facility] 11/05/2012, 11:08 AM  LOS: 1 day   Brief narrative: 79 year old woman presented with bradycardia, shortness of breath and was admitted for observation.  Consultants:  None  Procedures:  None  HPI/Subjective: Complains of chronic pain, otherwise doing well.  Objective: Filed Vitals:   11/04/12 1606 11/04/12 1809 11/04/12 2103 11/05/12 0611  BP: 187/83 177/83 135/81 127/79  Pulse: 57 67 84 69  Temp:  98.1 F (36.7 C) 98.1 F (36.7 C) 98.7 F (37.1 C)  TempSrc:  Axillary Oral   Resp: 24 20 20 20   Height:  5\' 7"  (1.702 m)    Weight:  42.8 kg (94 lb 5.7 oz)    SpO2: 100% 90% 100% 96%    Intake/Output Summary (Last 24 hours) at 11/05/12 1108 Last data filed at 11/04/12 1856  Gross per 24 hour  Intake      0 ml  Output    710 ml   Net   -710 ml   Filed Weights   11/04/12 1149 11/04/12 1809  Weight: 43.999 kg (97 lb) 42.8 kg (94 lb 5.7 oz)    Exam:  General:  Appears calm and comfortable Cardiovascular: RRR, no m/r/g. Telemetry: SR, no arrhythmias  Respiratory: CTA bilaterally, no w/r/r. Normal respiratory effort. Psychiatric: grossly normal mood and affect, speech fluent and appropriate  Data Reviewed: Basic Metabolic Panel:  Recent Labs Lab 11/04/12 1245 11/04/12 1509  NA 141 143  K 3.8 3.7  CL 106 110  CO2 26  --   GLUCOSE 94 91  BUN 17 15  CREATININE 0.74 0.70  CALCIUM 11.0*  --    CBC:  Recent Labs Lab 11/04/12 1245 11/04/12 1509  WBC 6.5  --   NEUTROABS 4.2  --   HGB 12.4 12.9  HCT 38.3 38.0  MCV 92.7  --   PLT 282  --    Cardiac Enzymes:  Recent Labs Lab 11/04/12 1245  TROPONINI <0.30   BNP (last 3 results)  Recent Labs  04/15/12 0726 08/14/12 1802 11/04/12 1245  PROBNP 5133.0* 2072.0* 1050.0*     Recent Results (from the past 240 hour(s))  URINE CULTURE     Status: None   Collection Time    10/31/12  3:30 PM      Result Value Range Status   Colony Count >=100,000 COLONIES/ML   Preliminary   Preliminary Report Gram Negative Rods   Preliminary     Studies: Ct Head Wo Contrast  11/04/2012  *RADIOLOGY REPORT*  Clinical Data: Brain  mass.  Hypertension.  CT HEAD WITHOUT CONTRAST  Technique:  Contiguous axial images were obtained from the base of the skull through the vertex without contrast.  Comparison: 08/14/2012  Findings: 2.5 cm right frontal meningioma, densely calcified, with striking but stable vasogenic edema in the right frontal lobe. There is a faint effacement of the inferior portions of the right lateral ventricle frontal horn, as well as 10 mm of right-to-left midline shift in the frontal lobe in the vicinity of the mass.  Physiologic calcification of the basal ganglia noted.  Brain stem, cerebellum, thalami, and basal ganglia otherwise unremarkable.  No  significant hydrocephalus  Periventricular and corona radiata white matter hypodensities are most compatible with chronic ischemic microvascular white matter disease.  No intracranial hemorrhage, mass lesion, or acute infarction is identified.  IMPRESSION:  1.  Stable appearance of 2.5 cm heavily calcified right frontal meningioma associated with considerable vasogenic edema and 10 mm of stable right-to-left midline shift in the frontal region.  No significant change from prior. 2. Periventricular and corona radiata white matter hypodensities are most compatible with chronic ischemic microvascular white matter disease.   Original Report Authenticated By: Gaylyn Rong, M.D.    Dg Chest Port 1 View  11/04/2012  *RADIOLOGY REPORT*  Clinical Data: 79 year old female with chest pain.  PORTABLE CHEST - 1 VIEW  Comparison: 08/14/2012 and earlier.  Findings: Portable upright AP view 1219 hours. There is cardiomegaly.  Other mediastinal contours are within normal limits. Stable tortuosity the thoracic aorta.  No pneumothorax or pulmonary edema.  No pleural effusion or consolidation.  Mild increased linear opacity in the lower right lung most resembles atelectasis. Osteopenia.  IMPRESSION: Mild right lung atelectasis, otherwise stable cardiomegaly and no acute cardiopulmonary abnormality.   Original Report Authenticated By: Erskine Speed, M.D.     Scheduled Meds: . cephALEXin  500 mg Oral BID  . docusate sodium  100 mg Oral BID  . enalapril  10 mg Oral BID  . enoxaparin (LOVENOX) injection  30 mg Subcutaneous Q24H  . pantoprazole  40 mg Oral Daily   Continuous Infusions:   Active Problems:   Chronic back pain   Meningioma   Peripheral edema   Adult failure to thrive   Vasogenic cerebral edema   UTI (lower urinary tract infection)   Chronic reactive Hypertension   Chronic Reactive Bradycardia   Dyspepsia   Brendia Sacks, MD  [facility] Pager [phone] If 7PM-7AM, please contact  night-coverage at [email or web address], password [facility] 11/05/2012, 11:08 AM  LOS: 1 day

## 2012-11-06 NOTE — Telephone Encounter (Signed)
Noted, pt has f/u in office tomorrow

## 2012-11-07 ENCOUNTER — Encounter: Payer: Self-pay | Admitting: Family Medicine

## 2012-11-07 ENCOUNTER — Ambulatory Visit (INDEPENDENT_AMBULATORY_CARE_PROVIDER_SITE_OTHER): Payer: Medicare Other | Admitting: Family Medicine

## 2012-11-07 VITALS — BP 136/74 | HR 70 | Resp 18 | Ht 60.0 in | Wt 98.0 lb

## 2012-11-07 DIAGNOSIS — I10 Essential (primary) hypertension: Secondary | ICD-10-CM

## 2012-11-07 DIAGNOSIS — R609 Edema, unspecified: Secondary | ICD-10-CM

## 2012-11-07 DIAGNOSIS — R001 Bradycardia, unspecified: Secondary | ICD-10-CM

## 2012-11-07 DIAGNOSIS — I498 Other specified cardiac arrhythmias: Secondary | ICD-10-CM

## 2012-11-07 DIAGNOSIS — R600 Localized edema: Secondary | ICD-10-CM

## 2012-11-07 NOTE — Progress Notes (Signed)
  Subjective:    Patient ID: Madison Hickman, female    DOB: 1924-01-16, 79 y.o.   MRN: [id number]  HPI  Patient here to followup visit from last week she was also admitted for 24-hour observation after being profoundly bradycardic with heart rates in the 40s by her home health nurse. Metoprolol was stopped she denies any complaints of shortness of breath chest pain dizziness currently. Her leg swelling has improved and she was given Lasix in the hospital other she does not want uses on a regular basis because it causes her to urinate too much. She has completed course for urinary tract infection  Review of Systems    GEN- denies fatigue, fever, weight loss,weakness, recent illness HEENT- denies eye drainage, change in vision, nasal discharge, CVS- denies chest pain, palpitations RESP- denies SOB, cough, wheeze ABD- denies N/V, change in stools, abd pain GU- + dysuria, hematuria, dribbling, incontinence MSK- + joint pain, muscle aches, injury Neuro- denies headache, dizziness, syncope, seizure activit    Objective:   Physical Exam GEN- NAD, alert and oriented x3 HEENT- PERRL, EOMI, non injected sclera, pink conjunctiva, MMM, oropharynx clear CVS- RRR, no murmur RESP-CTAB EXT- + pedal edema Pulses- Radial 2+        Assessment & Plan:

## 2012-11-07 NOTE — Patient Instructions (Signed)
We will order compression hose for your legs Use the lasix as needed for leg swelling We will continue to watch your blood pressure F/U 2 months [employer]

## 2012-11-09 NOTE — Assessment & Plan Note (Signed)
Will use lasix prn, as pt declines to take daily, agrees to compression hose, will order lower compression so she can tolerate

## 2012-11-09 NOTE — Assessment & Plan Note (Signed)
Beta blocker stopped, no symptoms

## 2012-11-09 NOTE — Assessment & Plan Note (Signed)
BP improved with fluid off, in setting of BB being removed

## 2012-11-10 ENCOUNTER — Telehealth: Payer: Self-pay | Admitting: Family Medicine

## 2012-11-10 ENCOUNTER — Emergency Department (HOSPITAL_COMMUNITY): Payer: Medicare Other

## 2012-11-10 ENCOUNTER — Emergency Department (HOSPITAL_COMMUNITY)
Admission: EM | Admit: 2012-11-10 | Discharge: 2012-11-10 | Disposition: A | Discharge status: Home / Self Care | Payer: Medicare Other | Attending: Emergency Medicine | Admitting: Emergency Medicine

## 2012-11-10 ENCOUNTER — Encounter (HOSPITAL_COMMUNITY): Payer: Self-pay | Admitting: *Deleted

## 2012-11-10 DIAGNOSIS — Z8669 Personal history of other diseases of the nervous system and sense organs: Secondary | ICD-10-CM | POA: Insufficient documentation

## 2012-11-10 DIAGNOSIS — R0602 Shortness of breath: Secondary | ICD-10-CM | POA: Insufficient documentation

## 2012-11-10 DIAGNOSIS — Z79899 Other long term (current) drug therapy: Secondary | ICD-10-CM | POA: Insufficient documentation

## 2012-11-10 DIAGNOSIS — G8929 Other chronic pain: Secondary | ICD-10-CM | POA: Insufficient documentation

## 2012-11-10 DIAGNOSIS — M549 Dorsalgia, unspecified: Secondary | ICD-10-CM | POA: Insufficient documentation

## 2012-11-10 DIAGNOSIS — Z8639 Personal history of other endocrine, nutritional and metabolic disease: Secondary | ICD-10-CM | POA: Insufficient documentation

## 2012-11-10 DIAGNOSIS — Z8679 Personal history of other diseases of the circulatory system: Secondary | ICD-10-CM | POA: Insufficient documentation

## 2012-11-10 DIAGNOSIS — I10 Essential (primary) hypertension: Secondary | ICD-10-CM | POA: Insufficient documentation

## 2012-11-10 DIAGNOSIS — R110 Nausea: Secondary | ICD-10-CM | POA: Insufficient documentation

## 2012-11-10 DIAGNOSIS — Z862 Personal history of diseases of the blood and blood-forming organs and certain disorders involving the immune mechanism: Secondary | ICD-10-CM | POA: Insufficient documentation

## 2012-11-10 DIAGNOSIS — M129 Arthropathy, unspecified: Secondary | ICD-10-CM | POA: Insufficient documentation

## 2012-11-10 DIAGNOSIS — R0789 Other chest pain: Secondary | ICD-10-CM | POA: Insufficient documentation

## 2012-11-10 LAB — CBC WITH DIFFERENTIAL/PLATELET
Basophils Absolute: 0 10*3/uL (ref 0.0–0.1)
Eosinophils Absolute: 0.2 10*3/uL (ref 0.0–0.7)
Eosinophils Relative: 4 % (ref 0–5)
Lymphocytes Relative: 20 % (ref 12–46)
Lymphs Abs: 1.2 10*3/uL (ref 0.7–4.0)
MCH: 30.7 pg (ref 26.0–34.0)
MCV: 92.8 fL (ref 78.0–100.0)
Neutrophils Relative %: 65 % (ref 43–77)
Platelets: 234 10*3/uL (ref 150–400)
RBC: 3.62 MIL/uL — ABNORMAL LOW (ref 3.87–5.11)
RDW: 15.7 % — ABNORMAL HIGH (ref 11.5–15.5)
WBC: 6.1 10*3/uL (ref 4.0–10.5)

## 2012-11-10 LAB — COMPREHENSIVE METABOLIC PANEL
ALT: 7 U/L (ref 0–35)
AST: 15 U/L (ref 0–37)
Alkaline Phosphatase: 63 U/L (ref 39–117)
Calcium: 11 mg/dL — ABNORMAL HIGH (ref 8.4–10.5)
Potassium: 4.3 mEq/L (ref 3.5–5.1)
Sodium: 139 mEq/L (ref 135–145)
Total Protein: 6 g/dL (ref 6.0–8.3)

## 2012-11-10 LAB — POCT I-STAT TROPONIN I
Troponin i, poc: 0.01 ng/mL (ref 0.00–0.08)
Troponin i, poc: 0.01 ng/mL (ref 0.00–0.08)

## 2012-11-10 MED ORDER — IOHEXOL 350 MG/ML SOLN
100.0000 mL | Freq: Once | INTRAVENOUS | Status: AC | PRN
  Administered 2012-11-10: 100 mL via INTRAVENOUS

## 2012-11-10 MED ORDER — CLONIDINE HCL 0.1 MG PO TABS
0.1000 mg | ORAL_TABLET | Freq: Once | ORAL | Status: AC
  Administered 2012-11-10: 0.1 mg via ORAL
  Filled 2012-11-10: qty 1

## 2012-11-10 MED ORDER — NITROGLYCERIN 2 % TD OINT
0.5000 [in_us] | TOPICAL_OINTMENT | Freq: Once | TRANSDERMAL | Status: AC
  Administered 2012-11-10: 0.5 [in_us] via TOPICAL
  Filled 2012-11-10 (×2): qty 1

## 2012-11-10 NOTE — ED Notes (Signed)
Pt assisted to bathroom. Steady gait noted. 

## 2012-11-10 NOTE — ED Notes (Signed)
Patient ambulated to restroom with assist

## 2012-11-10 NOTE — ED Notes (Signed)
Patient assisted to bathroom.  Urine specimen put in room in case ordered later.

## 2012-11-10 NOTE — ED Notes (Signed)
Patient ambulated to restroom with assist, tolerated well 

## 2012-11-10 NOTE — ED Provider Notes (Signed)
History  This chart was scribed for Ward Givens, MD by Bennett Scrape, ED Scribe. This patient was seen in room APA07/APA07 and the patient's care was started at 5:18 PM.  CSN: [id number]  Arrival date & time 11/10/12  1653   First MD Initiated Contact with Patient 11/10/12 1704      Chief Complaint  Patient presents with  . Chest Pain    Patient is a 79 y.o. female presenting with chest pain. The history is provided by the patient and a relative (son). No language interpreter was used.  Chest Pain Pain location:  L chest Pain quality: pressure   Pain radiates to:  Does not radiate Pain radiates to the back: no   Pain severity:  Mild Onset quality:  Gradual Progression:  Waxing and waning Context: movement   Relieved by:  Leaning forward Worsened by:  Movement Associated symptoms: nausea and shortness of breath   Associated symptoms: no cough, no diaphoresis and not vomiting     HPI Comments: Madison Hickman is a 79 y.o. female brought in by ambulance, who presents to the Emergency Department complaining of gradual onset, gradually worsening, waxing and waning left-sided CP described as sharp and heavy with associated SOB and nausea that started when she got up to "take my pain pill" this morning. She is unsure of the exact onset. She reports that movement, changing positions and ambulation are aggravating factors. She states that laying down alleviates her symptoms. She admits the the CP improved en route after she was given ASA and 2 SL NTG by EMS. She rates her pain as mild currently. She reports prior episodes of the same but is unsure of the diagnosis. Son is also unsure but denies any prior diagnoses of MI. He admits that the pt has been evaluated during prior admissions by a Cardiologist at [facility] but denies that she regularly follows up with one. Pt denies cough, diaphoresis and emesis as associated symptoms. She denies cough, diaphorese, but did feel SOB and nausea.   Son  states that the pt was admitted on 11/04/12 of last week for similar symptoms and was diagnosed with bradycardia and hypertension. Son states that the pt was taken off metoprolol due to the bradycardia in the 40s. HR is in the 60s today. Son states that pt's PCP also removed her from all her medications EXCEPT 5-325 mg Norco, which she has been taking for the past 5 years, and 10 mg enalapril twice daily for HTN.     She has a h/o chronic back pain, HTN, IDA and hypercalcemia. Pt denies smoking and alcohol use.   Lives with oldest son. Has a nurse during the week.  PCP is Dr. Jeanice [facility] [place]  Past Medical History  Diagnosis Date  . Back pain, chronic   . Headache   . Hypertension   . Arthritis   . Meningioma 04/15/2012  . IDA (iron deficiency anemia)   . Diastolic dysfunction Oct 2013    EF 50-55%   . Hypercalcemia     Past Surgical History  Procedure Laterality Date  . Abdominal hysterectomy    . Arm surgery    . Esophagogastroduodenoscopy  04/18/2012    WUJ:WJXBJ hiatal hernia/NO OBVIOUS SOURCE FOR PROFOUND ANEMIA IDENTIFIED.MODERATE GASTRITIS CONTRIBUTES TO ANEMIA/no definite stricture identified, empiric dilation performed. + H.PYLORI    No family history on file.  History  Substance Use Topics  . Smoking status: Never Smoker   . Smokeless tobacco: Not on file  .  Alcohol Use: No  lives at home Lives with son  No OB history provided.  Review of Systems  Constitutional: Negative for diaphoresis.  Respiratory: Positive for shortness of breath. Negative for cough.   Cardiovascular: Positive for chest pain. Negative for leg swelling.  Gastrointestinal: Positive for nausea. Negative for vomiting.  All other systems reviewed and are negative.    Allergies  Review of patient's allergies indicates no known allergies.  Home Medications   Current Outpatient Rx  Name  Route  Sig  Dispense  Refill  . acetaminophen (TYLENOL) 325 MG tablet   Oral   Take 2  tablets (650 mg total) by mouth every 6 (six) hours as needed.         . docusate sodium (COLACE) 100 MG capsule   Oral   Take 1 capsule (100 mg total) by mouth 2 (two) times daily.   10 capsule   0   . enalapril (VASOTEC) 10 MG tablet   Oral   Take 1 tablet (10 mg total) by mouth 2 (two) times daily.   60 tablet   3   . furosemide (LASIX) 20 MG tablet   Oral   Take 1 tablet (20 mg total) by mouth daily.   30 tablet   3   . HYDROcodone-acetaminophen (NORCO/VICODIN) 5-325 MG per tablet   Oral   Take 1 tablet by mouth every 4 (four) hours as needed for pain.   30 tablet   0   . pantoprazole (PROTONIX) 40 MG tablet   Oral   Take 40 mg by mouth daily.         Son states she is only taking norco and the vasotec  Triage Vitals: BP 151/81  Pulse 72  Temp(Src) 98.6 F (37 C) (Oral)  Resp 18  SpO2 95%  Vital signs normal    Physical Exam  Nursing note and vitals reviewed. Constitutional: She is oriented to person, place, and time.  Non-toxic appearance. She does not appear ill. No distress.  Frail, elderly   HENT:  Head: Normocephalic and atraumatic.  Right Ear: External ear normal.  Left Ear: External ear normal.  Nose: Nose normal. No mucosal edema or rhinorrhea.  Mouth/Throat: Oropharynx is clear and moist and mucous membranes are normal. No dental abscesses or edematous.  Eyes: Conjunctivae and EOM are normal. Pupils are equal, round, and reactive to light.  Neck: Normal range of motion and full passive range of motion without pain. Neck supple.  Cardiovascular: Normal rate, regular rhythm and normal heart sounds.  Exam reveals no gallop and no friction rub.   No murmur heard. Pulmonary/Chest: Effort normal and breath sounds normal. No respiratory distress. She has no wheezes. She has no rhonchi. She has no rales. She exhibits tenderness (tenderness just to the left of the sternum to palpation). She exhibits no crepitus.  Lungs are clear to ausculation    Abdominal: Soft. Normal appearance and bowel sounds are normal. She exhibits no distension. There is no tenderness. There is no rebound and no guarding.  Musculoskeletal: Normal range of motion. She exhibits no edema (no ankle swelling) and no tenderness.  Moves all extremities well. Thickening and deformity of finger joints consistent with arthritis.  Neurological: She is alert and oriented to person, place, and time. She has normal strength. No cranial nerve deficit.  Skin: Skin is warm, dry and intact. No rash noted. No erythema. No pallor.  Psychiatric: She has a normal mood and affect. Her speech is normal and  behavior is normal. Her mood appears not anxious.    ED Course  Procedures (including critical care time)  Medications  cloNIDine (CATAPRES) tablet 0.1 mg (not administered)  nitroGLYCERIN (NITROGLYN) 2 % ointment 0.5 inch (0.5 inches Topical Given 11/10/12 1746)  iohexol (OMNIPAQUE) 350 MG/ML injection 100 mL (100 mLs Intravenous Contrast Given 11/10/12 1925)    DIAGNOSTIC STUDIES: Oxygen Saturation is 95% on room air, normal by my interpretation.    COORDINATION OF CARE: 5:24 PM-Discussed treatment plan which includes CXR, CBC panel, CMP, d-dimer and troponin with pt at bedside and pt agreed to plan.   9:33 PM-Pt rechecked and feels improved with medications listed above. She denies CP currently. BP is 188/108. Informed pt of radiology and lab work results. Discussed discharge plan which includes HTN medication with pt and pt agreed to plan. Also advised pt to follow up with [facility] and pt agreed. Addressed symptoms to return for with pt.   Results for orders placed during the hospital encounter of 11/10/12  CBC WITH DIFFERENTIAL      Result Value Range   WBC 6.1  4.0 - 10.5 K/uL   RBC 3.62 (*) 3.87 - 5.11 MIL/uL   Hemoglobin 11.1 (*) 12.0 - 15.0 g/dL   HCT 16.1 (*) 09.6 - 04.5 %   MCV 92.8  78.0 - 100.0 fL   MCH 30.7  26.0 - 34.0 pg   MCHC 33.0  30.0 - 36.0  g/dL   RDW 40.9 (*) 81.1 - 91.4 %   Platelets 234  150 - 400 K/uL   Neutrophils Relative 65  43 - 77 %   Neutro Abs 3.9  1.7 - 7.7 K/uL   Lymphocytes Relative 20  12 - 46 %   Lymphs Abs 1.2  0.7 - 4.0 K/uL   Monocytes Relative 11  3 - 12 %   Monocytes Absolute 0.7  0.1 - 1.0 K/uL   Eosinophils Relative 4  0 - 5 %   Eosinophils Absolute 0.2  0.0 - 0.7 K/uL   Basophils Relative 0  0 - 1 %   Basophils Absolute 0.0  0.0 - 0.1 K/uL  COMPREHENSIVE METABOLIC PANEL      Result Value Range   Sodium 139  135 - 145 mEq/L   Potassium 4.3  3.5 - 5.1 mEq/L   Chloride 104  96 - 112 mEq/L   CO2 28  19 - 32 mEq/L   Glucose, Bld 93  70 - 99 mg/dL   BUN 15  6 - 23 mg/dL   Creatinine, Ser 7.82  0.50 - 1.10 mg/dL   Calcium 95.6 (*) 8.4 - 10.5 mg/dL   Total Protein 6.0  6.0 - 8.3 g/dL   Albumin 3.3 (*) 3.5 - 5.2 g/dL   AST 15  0 - 37 U/L   ALT 7  0 - 35 U/L   Alkaline Phosphatase 63  39 - 117 U/L   Total Bilirubin 0.3  0.3 - 1.2 mg/dL   GFR calc non Af Amer 73 (*) >90 mL/min   GFR calc Af Amer 85 (*) >90 mL/min  PRO B NATRIURETIC PEPTIDE      Result Value Range   Pro B Natriuretic peptide (BNP) 1130.0 (*) 0 - 450 pg/mL  D-DIMER, QUANTITATIVE      Result Value Range   D-Dimer, Quant 1.25 (*) 0.00 - 0.48 ug/mL-FEU  POCT I-STAT TROPONIN I      Result Value Range   Troponin i, poc 0.01  0.00 -  0.08 ng/mL   Comment 3           POCT I-STAT TROPONIN I      Result Value Range   Troponin i, poc 0.01  0.00 - 0.08 ng/mL   Comment 3             Laboratory interpretation all normal except stable elevation of BNP, chronically stable elevation of calcium, mild anemia   Ct Angio Chest W/cm &/or Wo Cm  11/10/2012  *RADIOLOGY REPORT*  Clinical Data: Left chest pain and short of breath  CT ANGIOGRAPHY CHEST  Technique:  Multidetector CT imaging of the chest using the standard protocol during bolus administration of intravenous contrast. Multiplanar reconstructed images including MIPs were obtained and  reviewed to evaluate the vascular anatomy.  Contrast: OMNIPAQUE IOHEXOL 350 MG/ML SOLN  Comparison: None.  Findings: No filling defects within the pulmonary arteries to suggest acute pulmonary embolism.  No acute findings of the aorta or great vessels.  No pericardial fluid.  Esophagus is normal.  No axillary or supraclavicular lymphadenopathy.  No mediastinal adenopathy.  Review of the lung windows demonstrates no pneumothorax or pleural fluid.  No focal consolidation.  Mild atelectasis at the lung bases.  Limited view of the upper abdomen is unremarkable.   There are several low density lesions within the liver which likely represent benign cysts are not changed from comparison exam.  Limited view of the skeleton demonstrates scoliosis and degenerative change.  IMPRESSION:  1.  No evidence of acute pulmonary embolism. 2.  Atherosclerotic disease. 3.  Mild basilar atelectasis.   Original Report Authenticated By: Genevive Bi, M.D.    Dg Chest Port 1 View  11/10/2012  *RADIOLOGY REPORT*  Clinical Data: Chest pain.  PORTABLE CHEST - 1 VIEW  Comparison: 11/04/2012  Findings: Single view of the chest was obtained.  Stable enlargement of the cardiac silhouette.  Atherosclerotic calcifications and tortuosity of the thoracic aorta.  There are increased densities in the right perihilar and right mid lung region.  The left lung appears clear.  IMPRESSION: Increased densities in the right mid lung and perihilar region. Findings could be related to focal atelectasis.   Original Report Authenticated By: Richarda Overlie, M.D.    Ct Head Wo Contrast  11/04/2012   IMPRESSION:  1.  Stable appearance of 2.5 cm heavily calcified right frontal meningioma associated with considerable vasogenic edema and 10 mm of stable right-to-left midline shift in the frontal region.  No significant change from prior. 2. Periventricular and corona radiata white matter hypodensities are most compatible with chronic ischemic microvascular white  matter disease.   Original Report Authenticated By: Gaylyn Rong, M.D.    Dg Chest Port 1 View  11/04/2012   IMPRESSION: Mild right lung atelectasis, otherwise stable cardiomegaly and no acute cardiopulmonary abnormality.   Original Report Authenticated By: Erskine Speed, M.D.      Date: 11/10/2012  Rate: 64  Rhythm: normal sinus rhythm  QRS Axis: normal  Intervals: normal  ST/T Wave abnormalities: nonspecific ST/T changes  Conduction Disutrbances:none  Narrative Interpretation:   Old EKG Reviewed: unchanged from 11/04/2012    1. Atypical chest pain   2. Hypertension     Plan discharge  Devoria Albe, MD, FACEP   MDM   I personally performed the services described in this documentation, which was scribed in my presence. The recorded information has been reviewed and considered.  Devoria Albe, MD, FACEP       Ward Givens, MD 11/10/12  2200 

## 2012-11-10 NOTE — ED Notes (Signed)
Chest pain onset today °

## 2012-11-11 NOTE — Telephone Encounter (Signed)
Spoke with April and updated rx for compression hose.

## 2012-11-14 ENCOUNTER — Telehealth: Payer: Self-pay | Admitting: Family Medicine

## 2012-11-14 MED ORDER — HYDROCODONE-ACETAMINOPHEN 5-325 MG PO TABS: 1.0000 | ORAL_TABLET | ORAL | Status: DC | PRN

## 2012-11-14 NOTE — Telephone Encounter (Signed)
Will fax to [facility]

## 2012-11-17 ENCOUNTER — Telehealth: Payer: Self-pay

## 2012-11-17 MED ORDER — PANTOPRAZOLE SODIUM 40 MG PO TBEC: 40.0000 mg | DELAYED_RELEASE_TABLET | Freq: Every day | ORAL | Status: DC

## 2012-11-17 NOTE — Telephone Encounter (Signed)
Son aware.

## 2012-11-17 NOTE — Telephone Encounter (Signed)
Madison Hickman had protonix at home, she can take 1 40mg  tablet daily ( best before breakfast) New script sent

## 2012-11-24 ENCOUNTER — Telehealth (INDEPENDENT_AMBULATORY_CARE_PROVIDER_SITE_OTHER): Payer: Medicare Other

## 2012-11-24 DIAGNOSIS — N3000 Acute cystitis without hematuria: Secondary | ICD-10-CM

## 2012-11-24 LAB — POCT URINALYSIS DIPSTICK
Glucose, UA: NEGATIVE
Spec Grav, UA: 1.025
Urobilinogen, UA: 0.2

## 2012-11-24 NOTE — Telephone Encounter (Signed)
Patient is taking enalapril as prescribed.   Urine for urinalysis.

## 2012-11-24 NOTE — Telephone Encounter (Signed)
Okay to bring by urine sample, make sure she is taking enalapril Also give her a dose of lasix, she tends to collect a good amount of fluid which also raises her BP but she does not like to take

## 2012-11-24 NOTE — Telephone Encounter (Signed)
Send for Urine culture, let them know we will notify them if antibiotics are needed Give her a dose of lasix if not already done

## 2012-11-24 NOTE — Telephone Encounter (Signed)
Son dropped off specimen

## 2012-11-24 NOTE — Addendum Note (Signed)
Addended by: Kandis Fantasia B on: 11/24/2012 02:24 PM   Modules accepted: Orders

## 2012-11-25 NOTE — Telephone Encounter (Signed)
Made Trey Sailors son aware.  He will make sure that she gets med.

## 2012-11-26 LAB — URINE CULTURE

## 2012-11-30 ENCOUNTER — Other Ambulatory Visit: Payer: Self-pay

## 2012-11-30 MED ORDER — HYDROCODONE-ACETAMINOPHEN 5-325 MG PO TABS: 1.0000 | ORAL_TABLET | ORAL | Status: DC | PRN

## 2012-12-02 ENCOUNTER — Telehealth: Payer: Self-pay

## 2012-12-02 MED ORDER — HYDROCODONE-ACETAMINOPHEN 5-325 MG PO TABS: 1.0000 | ORAL_TABLET | Freq: Two times a day (BID) | ORAL | Status: DC | PRN

## 2012-12-02 NOTE — Telephone Encounter (Signed)
She can be changed to 1 tablet twice a day as needed for pain #60,  If she is in severe pain or declining, her son needs to call me so we can discuss hopsice for her

## 2012-12-02 NOTE — Telephone Encounter (Signed)
Will make aware and fax

## 2012-12-02 NOTE — Telephone Encounter (Signed)
Madison Hickman called from [place] and states that she has been running out of her pain meds because she only has 30 to take one every 4 hours as needed and she has been taking them as needed but still is running out. Madison Hickman wants to know if the rx can be changed to quanity #60 instead of #30

## 2012-12-13 ENCOUNTER — Ambulatory Visit: Payer: Self-pay | Admitting: Family Medicine

## 2012-12-21 ENCOUNTER — Telehealth: Payer: Self-pay | Admitting: Family Medicine

## 2012-12-21 MED ORDER — HYDROCODONE-ACETAMINOPHEN 5-325 MG PO TABS: 1.0000 | ORAL_TABLET | Freq: Four times a day (QID) | ORAL | Status: DC | PRN

## 2012-12-21 MED ORDER — CEPHALEXIN 500 MG PO CAPS: 500.0000 mg | ORAL_CAPSULE | Freq: Two times a day (BID) | ORAL | Status: DC
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
## 2012-12-21 NOTE — Telephone Encounter (Signed)
I spoke with patient's son Madison Hickman. He states his mother has been doing well she complains of her typical pains but nothing out of the ordinary. He states that she'll a has not been to the home and they are not typically in contact with her. It appears her daughter called in stating she was in pain or wonder refill on her narcotic medications the like as prescribed 60 tablets on May 30. I spoke with Madison Hickman she complains of urinary pressure and dysuria which is ongoing. She also has constipation and states that her back is always hurting. She states she does not have enough medication to take twice a day. I will call [facility] pharmacy to see if they actually filled the prescription on the 30th. The directions given on the 30th for one tablet twice a day she states some days she does not take the medication. he looked at her bottle she has 9 pills left

## 2012-12-21 NOTE — Telephone Encounter (Signed)
Called [facility] and asked for Madison Hickman..they will have her to call me back.

## 2012-12-21 NOTE — Telephone Encounter (Signed)
Daughter [name] called back.  Would like for you to call her at number [phone].  She states son Orvilla Fus not the reliable one to talk to!!

## 2012-12-21 NOTE — Telephone Encounter (Signed)
Just had refill of Hydrocodone on 12/02/12  #60 with BID prn dosing.  Daughter states she is using more then two a day and that now has none.  Pharmacy won't refill because too early.  Want refill now.  States patient in a lot of pain and has been taking more then two a day

## 2012-12-22 ENCOUNTER — Telehealth: Payer: Self-pay | Admitting: Family Medicine

## 2012-12-22 NOTE — Telephone Encounter (Signed)
Called [facility] again today to see if I can speak with Britt Boozer receptionist is going to email her and let her know to give me a call

## 2012-12-24 NOTE — Progress Notes (Signed)
REVIEWED.  

## 2012-12-28 ENCOUNTER — Ambulatory Visit: Payer: Self-pay | Admitting: Family Medicine

## 2012-12-29 ENCOUNTER — Telehealth: Payer: Self-pay | Admitting: Family Medicine

## 2012-12-29 NOTE — Telephone Encounter (Signed)
I spoke with Britt Boozer from [facility] on 6/24, they have not been in the home for past 3 weeks, was told by family she was not coming to [facility] to follow me they thought she had no PCP therefore stopped giving services Advised her this was incorrect- and services will be restarted She was concerned about something bulging from the vaginal area on Ms. Thielke, she complains when she wipes, she had appt for 6/25 but this was rescheduled Will evaluate this when she comes in

## 2012-12-30 ENCOUNTER — Telehealth: Payer: Self-pay | Admitting: Family Medicine

## 2012-12-30 NOTE — Telephone Encounter (Signed)
OK done

## 2012-12-30 NOTE — Telephone Encounter (Signed)
[facility] happy to reinstate patient services but per Medicare guidelines needs a face to face doctor visit within previous 90 days or in upcoming 30 days. Let them know when arranged.

## 2012-12-30 NOTE — Telephone Encounter (Signed)
Last appt was in May, this should count

## 2013-01-01 DIAGNOSIS — I10 Essential (primary) hypertension: Secondary | ICD-10-CM

## 2013-01-01 DIAGNOSIS — I5032 Chronic diastolic (congestive) heart failure: Secondary | ICD-10-CM

## 2013-01-01 DIAGNOSIS — I498 Other specified cardiac arrhythmias: Secondary | ICD-10-CM

## 2013-01-01 DIAGNOSIS — N390 Urinary tract infection, site not specified: Secondary | ICD-10-CM

## 2013-01-04 ENCOUNTER — Other Ambulatory Visit (HOSPITAL_COMMUNITY): Payer: Self-pay | Admitting: Orthopaedic Surgery

## 2013-01-04 DIAGNOSIS — S22070A Wedge compression fracture of T9-T10 vertebra, initial encounter for closed fracture: Secondary | ICD-10-CM

## 2013-01-04 DIAGNOSIS — S32020A Wedge compression fracture of second lumbar vertebra, initial encounter for closed fracture: Secondary | ICD-10-CM

## 2013-01-05 ENCOUNTER — Encounter (HOSPITAL_COMMUNITY): Payer: Self-pay

## 2013-01-05 ENCOUNTER — Encounter (HOSPITAL_COMMUNITY)
Admission: RE | Admit: 2013-01-05 | Discharge: 2013-01-05 | Disposition: A | Discharge status: Home / Self Care | Payer: Medicare Other | Source: Ambulatory Visit | Attending: Orthopaedic Surgery | Admitting: Orthopaedic Surgery

## 2013-01-05 DIAGNOSIS — M549 Dorsalgia, unspecified: Secondary | ICD-10-CM | POA: Insufficient documentation

## 2013-01-05 DIAGNOSIS — S32020A Wedge compression fracture of second lumbar vertebra, initial encounter for closed fracture: Secondary | ICD-10-CM

## 2013-01-05 DIAGNOSIS — S22070A Wedge compression fracture of T9-T10 vertebra, initial encounter for closed fracture: Secondary | ICD-10-CM

## 2013-01-05 MED ORDER — TECHNETIUM TC 99M MEDRONATE IV KIT
25.0000 | PACK | Freq: Once | INTRAVENOUS | Status: AC | PRN
  Administered 2013-01-05: 25 via INTRAVENOUS

## 2013-01-16 ENCOUNTER — Ambulatory Visit: Payer: Medicare Other | Admitting: Family Medicine

## 2013-01-19 ENCOUNTER — Telehealth: Payer: Self-pay | Admitting: Family Medicine

## 2013-01-19 MED ORDER — ENALAPRIL MALEATE 10 MG PO TABS: 10.0000 mg | ORAL_TABLET | Freq: Two times a day (BID) | ORAL | Status: AC

## 2013-01-19 NOTE — Telephone Encounter (Signed)
Med refilled.

## 2013-01-24 ENCOUNTER — Telehealth: Payer: Self-pay | Admitting: Family Medicine

## 2013-01-24 NOTE — Telephone Encounter (Signed)
noted 

## 2013-01-24 NOTE — Telephone Encounter (Signed)
Nikki from [facility] called and stated that she was going to discharge Madison Hickman, she has missed 3 appts with [place] still has symptoms of UTI's,irritated in vaginal area. Nikki just wanted to let youknow.

## 2013-01-27 ENCOUNTER — Encounter: Payer: Self-pay | Admitting: Family Medicine

## 2013-01-27 ENCOUNTER — Ambulatory Visit (INDEPENDENT_AMBULATORY_CARE_PROVIDER_SITE_OTHER): Payer: Medicare Other | Admitting: Family Medicine

## 2013-01-27 VITALS — BP 178/100 | HR 88 | Temp 97.4°F | Resp 20 | Wt 104.0 lb

## 2013-01-27 DIAGNOSIS — R300 Dysuria: Secondary | ICD-10-CM

## 2013-01-27 DIAGNOSIS — M549 Dorsalgia, unspecified: Secondary | ICD-10-CM

## 2013-01-27 DIAGNOSIS — R21 Rash and other nonspecific skin eruption: Secondary | ICD-10-CM

## 2013-01-27 DIAGNOSIS — I5032 Chronic diastolic (congestive) heart failure: Secondary | ICD-10-CM

## 2013-01-27 DIAGNOSIS — N390 Urinary tract infection, site not specified: Secondary | ICD-10-CM

## 2013-01-27 DIAGNOSIS — G8929 Other chronic pain: Secondary | ICD-10-CM

## 2013-01-27 DIAGNOSIS — N8110 Cystocele, unspecified: Secondary | ICD-10-CM

## 2013-01-27 DIAGNOSIS — I509 Heart failure, unspecified: Secondary | ICD-10-CM

## 2013-01-27 DIAGNOSIS — N8111 Cystocele, midline: Secondary | ICD-10-CM

## 2013-01-27 DIAGNOSIS — I10 Essential (primary) hypertension: Secondary | ICD-10-CM

## 2013-01-27 LAB — URINALYSIS, ROUTINE W REFLEX MICROSCOPIC
Bilirubin Urine: NEGATIVE
Glucose, UA: NEGATIVE mg/dL
Protein, ur: 30 mg/dL — AB
Specific Gravity, Urine: 1.025 (ref 1.005–1.030)

## 2013-01-27 LAB — URINALYSIS, MICROSCOPIC ONLY
Crystals: NONE SEEN
Squamous Epithelial / LPF: NONE SEEN

## 2013-01-27 MED ORDER — NYSTATIN 100000 UNIT/GM EX CREA: TOPICAL_CREAM | Freq: Two times a day (BID) | CUTANEOUS | Status: DC

## 2013-01-27 MED ORDER — AMLODIPINE BESYLATE 5 MG PO TABS: 5.0000 mg | ORAL_TABLET | Freq: Every day | ORAL | Status: AC

## 2013-01-27 MED ORDER — CIPROFLOXACIN HCL 500 MG PO TABS: 500.0000 mg | ORAL_TABLET | Freq: Two times a day (BID) | ORAL | Status: DC

## 2013-01-27 MED ORDER — HYDROCODONE-ACETAMINOPHEN 5-325 MG PO TABS: 1.0000 | ORAL_TABLET | Freq: Four times a day (QID) | ORAL | Status: DC | PRN

## 2013-01-27 NOTE — Patient Instructions (Addendum)
Norvasc 5mg  for blood pressure You have a prolapsed bladder Yeast infection on skin in vaginal area- cream twice a day for 2 weeks Pain medication changed to four times a day  We will call if antibiotics needed for urine We will get [facility] nurse back to home F/U 1 week

## 2013-01-29 DIAGNOSIS — N390 Urinary tract infection, site not specified: Secondary | ICD-10-CM | POA: Insufficient documentation

## 2013-01-29 DIAGNOSIS — N8110 Cystocele, unspecified: Secondary | ICD-10-CM | POA: Insufficient documentation

## 2013-01-29 DIAGNOSIS — R21 Rash and other nonspecific skin eruption: Secondary | ICD-10-CM | POA: Insufficient documentation

## 2013-01-29 LAB — URINE CULTURE: Colony Count: >=100000

## 2013-01-29 NOTE — Assessment & Plan Note (Signed)
Severely elevated BP  Will add low dose norvasc She did not tolerate BB due to HR

## 2013-01-29 NOTE — Assessment & Plan Note (Signed)
Unchanged, will give her 4 tabs of day of hydrocodone, now treating for comfort and quality of life

## 2013-01-29 NOTE — Assessment & Plan Note (Signed)
Treat with antibiotics, urine culture positive E coli

## 2013-01-29 NOTE — Assessment & Plan Note (Signed)
Discussed with pt, no need for surgical intervention, has incontinence at baseline

## 2013-01-29 NOTE — Assessment & Plan Note (Signed)
Due to moisture from incontinence, given nystatin cream to cover yeast Also has vaginal atrophy

## 2013-01-29 NOTE — Progress Notes (Signed)
  Subjective:    Patient ID: Madison Hickman, female    DOB: 10-02-1923, 79 y.o.   MRN: [id number]  HPI  Pt here to f/u, was seen by her [facility] sent for evaluation of skin rash on bottom and bladder problems. Has history of incontinence, complains of burning with urination, sometimes dribbling and unable to completely empty bladder, also feels something hanging.  HTN- taking her BP meds but not the water pill  Chronic pain- using 4 pain pills a day, seen by ortho Dr. Hilda Lias, had bone scan done as concerned for new fracture but negative. Still able to ambulate but complains of Hip and back pain they never lets up. No recent fall  Appetite is good, has gained a few pounds since last visit  Review of Systems   GEN- denies fatigue, fever, weight loss,weakness, recent illness HEENT- denies eye drainage, change in vision, nasal discharge, CVS- denies chest pain, palpitations RESP- denies SOB, cough, wheeze ABD- denies N/V, change in stools, abd pain GU- + dysuria, hematuria, +dribbling,+ incontinence MSK- + joint pain, muscle aches, injury Neuro- denies headache, dizziness, syncope, seizure activity      Objective:   Physical Exam GEN- NAD, alert and oriented x 3 HEENT-PERRL, EOMI, MMM, oropharynx clear CVS- RRR, no murmur RESP-CTAB ABD-NABS,soft, mild suprapubic tenderness, no rebound, no gaurding. No CVA tenderness GU- normal external genitalia, vaginal mucosa pink and moist, +bladder prolapse, no blood seen- Cath specimen for urine taken, no pain on insertion of 1 finger, no vaginal discharge, +vaginal atrophy Skin- erythema along gluteal crease and over labia majora, +moisture, no discharge seen        Assessment & Plan:

## 2013-01-29 NOTE — Assessment & Plan Note (Signed)
She declines use of diuretic

## 2013-01-31 ENCOUNTER — Telehealth: Payer: Self-pay | Admitting: Family Medicine

## 2013-01-31 NOTE — Telephone Encounter (Signed)
Message copied by Samuella Cota on Tue Jan 31, 2013  2:44 PM ------      Message from: Milinda Antis F      Created: Sun Jan 29, 2013  9:00 PM      Regarding: [facility] Nurse                     Please contact [facility] regarding the last message, to see if they have discharged her. I have not seen any paperwork.       She needs BP followed at home.  ------

## 2013-01-31 NOTE — Telephone Encounter (Signed)
Madison Hickman from [facility] was here today and I and Dr. Jeanice Lim spoke to her about pt, Madison Hickman stated that she will look into discharged of pt and let me know.

## 2013-02-03 ENCOUNTER — Telehealth: Payer: Self-pay | Admitting: Family Medicine

## 2013-02-03 ENCOUNTER — Ambulatory Visit: Payer: Medicare Other | Admitting: Family Medicine

## 2013-02-03 MED ORDER — NYSTATIN 100000 UNIT/GM EX CREA: TOPICAL_CREAM | Freq: Two times a day (BID) | CUTANEOUS | Status: DC

## 2013-02-03 NOTE — Telephone Encounter (Signed)
Med refilled.

## 2013-02-06 ENCOUNTER — Telehealth: Payer: Self-pay | Admitting: Family Medicine

## 2013-02-06 NOTE — Telephone Encounter (Signed)
Spoke to daughter Kahleah Crass) and she stated that her aunt had a bladder tact that did not require her to be put to sleep or heavy sedation and she would like to know if that would be an option for her mother? (Her number is [phone] and if she don't answer it is ok to leave a voice mail message.)

## 2013-02-06 NOTE — Telephone Encounter (Signed)
I can refer her to the urologist and they can ask those questions, i do not know the specifics, but anything that requires surgery I would not recommend. If they want to see urology I will refer to the one closest to her

## 2013-02-06 NOTE — Telephone Encounter (Signed)
I actually saw Madison Hickman the other day and diagnosed the pelvic prolapse and advised her she should not have anything done based on her age and other medical problems She is in depends and should continue these She was treated for infection They need to keep her dry.  Please give message to pt daughter, I left VM

## 2013-02-07 NOTE — Telephone Encounter (Signed)
.  Patient's daughter aware and will talk over referral with other siblings and call us back.

## 2013-02-08 ENCOUNTER — Emergency Department (HOSPITAL_COMMUNITY)
Admission: EM | Admit: 2013-02-08 | Discharge: 2013-02-08 | Disposition: A | Discharge status: Home / Self Care | Payer: Medicare Other | Attending: Emergency Medicine | Admitting: Emergency Medicine

## 2013-02-08 ENCOUNTER — Emergency Department (HOSPITAL_COMMUNITY): Payer: Medicare Other

## 2013-02-08 ENCOUNTER — Encounter (HOSPITAL_COMMUNITY): Payer: Self-pay | Admitting: *Deleted

## 2013-02-08 DIAGNOSIS — I10 Essential (primary) hypertension: Secondary | ICD-10-CM | POA: Insufficient documentation

## 2013-02-08 DIAGNOSIS — Z8639 Personal history of other endocrine, nutritional and metabolic disease: Secondary | ICD-10-CM | POA: Insufficient documentation

## 2013-02-08 DIAGNOSIS — M129 Arthropathy, unspecified: Secondary | ICD-10-CM | POA: Insufficient documentation

## 2013-02-08 DIAGNOSIS — Z8669 Personal history of other diseases of the nervous system and sense organs: Secondary | ICD-10-CM | POA: Insufficient documentation

## 2013-02-08 DIAGNOSIS — Z8742 Personal history of other diseases of the female genital tract: Secondary | ICD-10-CM | POA: Insufficient documentation

## 2013-02-08 DIAGNOSIS — Z8679 Personal history of other diseases of the circulatory system: Secondary | ICD-10-CM | POA: Insufficient documentation

## 2013-02-08 DIAGNOSIS — Z79899 Other long term (current) drug therapy: Secondary | ICD-10-CM | POA: Insufficient documentation

## 2013-02-08 DIAGNOSIS — M549 Dorsalgia, unspecified: Secondary | ICD-10-CM | POA: Insufficient documentation

## 2013-02-08 DIAGNOSIS — I2510 Atherosclerotic heart disease of native coronary artery without angina pectoris: Secondary | ICD-10-CM | POA: Insufficient documentation

## 2013-02-08 DIAGNOSIS — G8929 Other chronic pain: Secondary | ICD-10-CM | POA: Insufficient documentation

## 2013-02-08 DIAGNOSIS — R079 Chest pain, unspecified: Secondary | ICD-10-CM

## 2013-02-08 DIAGNOSIS — Z862 Personal history of diseases of the blood and blood-forming organs and certain disorders involving the immune mechanism: Secondary | ICD-10-CM | POA: Insufficient documentation

## 2013-02-08 DIAGNOSIS — R51 Headache: Secondary | ICD-10-CM | POA: Insufficient documentation

## 2013-02-08 HISTORY — DX: Cystocele, unspecified: N81.10

## 2013-02-08 LAB — BASIC METABOLIC PANEL
BUN: 17 mg/dL (ref 6–23)
Chloride: 106 mEq/L (ref 96–112)
Creatinine, Ser: 0.83 mg/dL (ref 0.50–1.10)
GFR calc Af Amer: 71 mL/min — ABNORMAL LOW (ref >90)
GFR calc non Af Amer: 62 mL/min — ABNORMAL LOW (ref >90)
Glucose, Bld: 110 mg/dL — ABNORMAL HIGH (ref 70–99)

## 2013-02-08 LAB — CBC WITH DIFFERENTIAL/PLATELET
Basophils Relative: 1 % (ref 0–1)
Eosinophils Absolute: 0.3 10*3/uL (ref 0.0–0.7)
HCT: 37.4 % (ref 36.0–46.0)
Hemoglobin: 11.9 g/dL — ABNORMAL LOW (ref 12.0–15.0)
MCH: 29.4 pg (ref 26.0–34.0)
MCHC: 31.8 g/dL (ref 30.0–36.0)
Monocytes Absolute: 0.6 10*3/uL (ref 0.1–1.0)
Monocytes Relative: 8 % (ref 3–12)

## 2013-02-08 NOTE — ED Notes (Signed)
Patient called EMS for reported sensation of choking in left upper chest.  Upon arrival was standing, stating it hurt to lay or sit.  EKG revealed afib w/univocal PVCs, placed on 15L NRB, shortly afterward converted to NSR.  Choking sesation continues.

## 2013-02-08 NOTE — ED Provider Notes (Signed)
CSN: [id number]     Arrival date & time 02/08/13  1520 History  This chart was scribed for Lyanne Co, MD by Greggory Stallion, ED Scribe. This patient was seen in room APA02/APA02 and the patient's care was started at 3:49 PM.   Chief Complaint  Patient presents with  . Chest Pain   The history is provided by the patient. No language interpreter was used.    HPI Comments: Madison Hickman is a 79 y.o. female brought to ED by EMS who presents to the Emergency Department complaining of sudden onset, gradually worsening left upper chest pain that started earlier this morning after she ate breakfast. She states she felt like she was choking. Pt was not given any medication from EMS. Nothing worseed or improved the pain. Pt states CP has resolved now. Pt has had emesis in the last few days but not today. She denies abdominal pain, hematochezia, diarrhea, difficulty breathing and SOB as associated symptoms.  His with a history coronary artery disease.  She states her pain is been constant throughout the entire day and now has resolved.  No medications prior to arrival except for her on 7.5 mg hydrocodone.  Past Medical History  Diagnosis Date  . Back pain, chronic   . Headache(784.0)   . Hypertension   . Arthritis   . Meningioma 04/15/2012  . IDA (iron deficiency anemia)   . Diastolic dysfunction Oct 2013    EF 50-55%   . Hypercalcemia   . Bladder prolapse, female, acquired    Past Surgical History  Procedure Laterality Date  . Abdominal hysterectomy    . Arm surgery    . Esophagogastroduodenoscopy  04/18/2012    WUJ:WJXBJ hiatal hernia/NO OBVIOUS SOURCE FOR PROFOUND ANEMIA IDENTIFIED.MODERATE GASTRITIS CONTRIBUTES TO ANEMIA/no definite stricture identified, empiric dilation performed. + H.PYLORI   History reviewed. No pertinent family history. History  Substance Use Topics  . Smoking status: Never Smoker   . Smokeless tobacco: Not on file  . Alcohol Use: No   OB History   Grav  Para Term Preterm Abortions TAB SAB Ect Mult Living                 Review of Systems A complete 10 system review of systems was obtained and all systems are negative except as noted in the HPI and PMH.   Allergies  Review of patient's allergies indicates no known allergies.  Home Medications   Current Outpatient Rx  Name  Route  Sig  Dispense  Refill  . amLODipine (NORVASC) 5 MG tablet   Oral   Take 1 tablet (5 mg total) by mouth daily. For blood pressure   30 tablet   3   . enalapril (VASOTEC) 10 MG tablet   Oral   Take 1 tablet (10 mg total) by mouth 2 (two) times daily.   60 tablet   3   . HYDROcodone-acetaminophen (NORCO) 7.5-325 MG per tablet   Oral   Take 1 tablet by mouth every 6 (six) hours as needed for pain.         Marland Kitchen nystatin cream (MYCOSTATIN)   Topical   Apply topically 2 (two) times daily. For 2 weeks   30 g   0   . pantoprazole (PROTONIX) 40 MG tablet   Oral   Take 1 tablet (40 mg total) by mouth daily. 30 minutes before breakfast for GERD   30 tablet   6   . docusate sodium (COLACE) 100 MG capsule  Oral   Take 100 mg by mouth daily as needed for constipation.          BP 134/101  Pulse 70  Temp(Src) 98.6 F (37 C) (Oral)  Resp 18  Ht 5\' 5"  (1.651 m)  Wt 104 lb (47.174 kg)  BMI 17.31 kg/m2  SpO2 96%  Physical Exam  Nursing note and vitals reviewed. Constitutional: She is oriented to person, place, and time. She appears well-developed and well-nourished. No distress.  HENT:  Head: Normocephalic and atraumatic.  Eyes: EOM are normal.  Neck: Normal range of motion.  Cardiovascular: Normal rate, regular rhythm and normal heart sounds.   Pulmonary/Chest: Effort normal and breath sounds normal.  Abdominal: Soft. She exhibits no distension. There is no tenderness.  Musculoskeletal: Normal range of motion.  Neurological: She is alert and oriented to person, place, and time.  Skin: Skin is warm and dry.  Psychiatric: She has a normal  mood and affect. Judgment normal.    ED Course   Procedures (including critical care time)  DIAGNOSTIC STUDIES: Oxygen Saturation is 96% on RA, normal by my interpretation.    COORDINATION OF CARE: 3:57 PM-Discussed treatment plan which includes blood work and chest x-ray with pt at bedside and pt agreed to plan.    Date: 02/08/2013  Rate: 61  Rhythm: normal sinus rhythm  QRS Axis: normal  Intervals: normal  ST/T Wave abnormalities: normal and nonspecific T wave changes  Conduction Disutrbances:none  Narrative Interpretation: PVC, inferior T wave inversions  Old EKG Reviewed: none available   Labs Reviewed  CBC WITH DIFFERENTIAL - Abnormal; Notable for the following:    Hemoglobin 11.9 (*)    All other components within normal limits  BASIC METABOLIC PANEL - Abnormal; Notable for the following:    Glucose, Bld 110 (*)    Calcium 11.1 (*)    GFR calc non Af Amer 62 (*)    GFR calc Af Amer 71 (*)    All other components within normal limits  TROPONIN I  TROPONIN I   Dg Chest Portable 1 View  02/08/2013   *RADIOLOGY REPORT*  Clinical Data: Chest pain  PORTABLE CHEST - 1 VIEW  Comparison: 11/10/2012  Findings: The cardiac shadow remains enlarged.  The lungs are well- aerated.  Scarring is again noted throughout both lungs.  No focal infiltrate or sizable effusion is seen.  IMPRESSION: Chronic changes without acute abnormality.   Original Report Authenticated By: Alcide Clever, M.D.   I personally reviewed the imaging tests through PACS system I reviewed available ER/hospitalization records through the EMR   1. Chest pain     MDM  Patient with constant chest pain today.  EKG without significant abnormalities.  These are likely old inferior T-wave inversions.  Troponin x2 is negative.  He seems to be more musculoskeletal type issue.  Discharge home in good condition.       I personally performed the services described in this documentation, which was scribed in my  presence. The recorded information has been reviewed and is accurate.        Lyanne Co, MD 02/08/13 [phone]

## 2013-02-08 NOTE — ED Notes (Signed)
Patient given discharge instruction, verbalized understand. IV removed, band aid applied. Patient in wheel chair out of the department with pt advocate

## 2013-02-28 ENCOUNTER — Encounter (HOSPITAL_COMMUNITY): Payer: Self-pay | Admitting: Emergency Medicine

## 2013-02-28 ENCOUNTER — Encounter (HOSPITAL_COMMUNITY): Payer: Self-pay | Admitting: *Deleted

## 2013-02-28 ENCOUNTER — Emergency Department (HOSPITAL_COMMUNITY)
Admission: EM | Admit: 2013-02-28 | Discharge: 2013-02-28 | Disposition: A | Discharge status: Home / Self Care | Payer: Medicare Other | Attending: Emergency Medicine | Admitting: Emergency Medicine

## 2013-02-28 ENCOUNTER — Emergency Department (HOSPITAL_COMMUNITY)
Admission: EM | Admit: 2013-02-28 | Discharge: 2013-02-28 | Disposition: A | Discharge status: Home / Self Care | Payer: Medicare Other | Source: Home / Self Care | Attending: Emergency Medicine | Admitting: Emergency Medicine

## 2013-02-28 DIAGNOSIS — Z8742 Personal history of other diseases of the female genital tract: Secondary | ICD-10-CM | POA: Insufficient documentation

## 2013-02-28 DIAGNOSIS — Z862 Personal history of diseases of the blood and blood-forming organs and certain disorders involving the immune mechanism: Secondary | ICD-10-CM | POA: Insufficient documentation

## 2013-02-28 DIAGNOSIS — Z8739 Personal history of other diseases of the musculoskeletal system and connective tissue: Secondary | ICD-10-CM | POA: Insufficient documentation

## 2013-02-28 DIAGNOSIS — F0390 Unspecified dementia without behavioral disturbance: Secondary | ICD-10-CM

## 2013-02-28 DIAGNOSIS — N390 Urinary tract infection, site not specified: Secondary | ICD-10-CM

## 2013-02-28 DIAGNOSIS — I10 Essential (primary) hypertension: Secondary | ICD-10-CM | POA: Insufficient documentation

## 2013-02-28 DIAGNOSIS — Z79899 Other long term (current) drug therapy: Secondary | ICD-10-CM | POA: Insufficient documentation

## 2013-02-28 DIAGNOSIS — G8929 Other chronic pain: Secondary | ICD-10-CM | POA: Insufficient documentation

## 2013-02-28 DIAGNOSIS — Z8669 Personal history of other diseases of the nervous system and sense organs: Secondary | ICD-10-CM | POA: Insufficient documentation

## 2013-02-28 DIAGNOSIS — R300 Dysuria: Secondary | ICD-10-CM | POA: Insufficient documentation

## 2013-02-28 DIAGNOSIS — Z8679 Personal history of other diseases of the circulatory system: Secondary | ICD-10-CM | POA: Insufficient documentation

## 2013-02-28 DIAGNOSIS — Z85841 Personal history of malignant neoplasm of brain: Secondary | ICD-10-CM | POA: Insufficient documentation

## 2013-02-28 DIAGNOSIS — N8111 Cystocele, midline: Secondary | ICD-10-CM | POA: Insufficient documentation

## 2013-02-28 DIAGNOSIS — R4182 Altered mental status, unspecified: Secondary | ICD-10-CM | POA: Insufficient documentation

## 2013-02-28 DIAGNOSIS — R339 Retention of urine, unspecified: Secondary | ICD-10-CM

## 2013-02-28 DIAGNOSIS — R109 Unspecified abdominal pain: Secondary | ICD-10-CM | POA: Insufficient documentation

## 2013-02-28 DIAGNOSIS — Z8639 Personal history of other endocrine, nutritional and metabolic disease: Secondary | ICD-10-CM | POA: Insufficient documentation

## 2013-02-28 DIAGNOSIS — Z90710 Acquired absence of both cervix and uterus: Secondary | ICD-10-CM | POA: Insufficient documentation

## 2013-02-28 DIAGNOSIS — N8110 Cystocele, unspecified: Secondary | ICD-10-CM

## 2013-02-28 LAB — CBC WITH DIFFERENTIAL/PLATELET
Basophils Absolute: 0 10*3/uL (ref 0.0–0.1)
Eosinophils Relative: 1 % (ref 0–5)
HCT: 36 % (ref 36.0–46.0)
Hemoglobin: 11.6 g/dL — ABNORMAL LOW (ref 12.0–15.0)
Lymphocytes Relative: 9 % — ABNORMAL LOW (ref 12–46)
Lymphs Abs: 0.8 10*3/uL (ref 0.7–4.0)
MCV: 91.4 fL (ref 78.0–100.0)
Monocytes Absolute: 0.8 10*3/uL (ref 0.1–1.0)
Monocytes Relative: 9 % (ref 3–12)
RDW: 14 % (ref 11.5–15.5)
WBC: 8.7 10*3/uL (ref 4.0–10.5)

## 2013-02-28 LAB — URINALYSIS, ROUTINE W REFLEX MICROSCOPIC
Glucose, UA: NEGATIVE mg/dL
Protein, ur: NEGATIVE mg/dL
pH: 6 (ref 5.0–8.0)

## 2013-02-28 LAB — BASIC METABOLIC PANEL
BUN: 18 mg/dL (ref 6–23)
CO2: 25 mEq/L (ref 19–32)
Calcium: 10.9 mg/dL — ABNORMAL HIGH (ref 8.4–10.5)
Chloride: 101 mEq/L (ref 96–112)
Creatinine, Ser: 1.04 mg/dL (ref 0.50–1.10)
Glucose, Bld: 120 mg/dL — ABNORMAL HIGH (ref 70–99)

## 2013-02-28 LAB — URINE MICROSCOPIC-ADD ON

## 2013-02-28 MED ORDER — HYDROCODONE-ACETAMINOPHEN 5-325 MG PO TABS
ORAL_TABLET | ORAL | Status: AC
  Administered 2013-02-28: 1 via ORAL
  Filled 2013-02-28: qty 1

## 2013-02-28 MED ORDER — CEPHALEXIN 500 MG PO CAPS: 500.0000 mg | ORAL_CAPSULE | Freq: Three times a day (TID) | ORAL | Status: DC

## 2013-02-28 MED ORDER — HYDROCODONE-ACETAMINOPHEN 5-325 MG PO TABS
1.0000 | ORAL_TABLET | Freq: Once | ORAL | Status: AC
  Administered 2013-02-28: 1 via ORAL

## 2013-02-28 NOTE — Consult Note (Addendum)
Asked to see this alert 79 year old female with vaginal vault prolapse and acute and chronic urinary retention due to the prolapse. Patient is seen at 6:45 AM, with followup reassessment at 7:15 and 10 AM. Patient has apparent long-term prolapse given the tissues were thickened and the vaginal vault, and anterior wall including cystocele are completely prolapsed and are acutely distended due to urinary retention. With some compression near the urethra with the patient has overflow incontinence. After obtaining patient consent Foley catheter was inserted and 700 cc of urine obtained. The patient is status post hysterectomy Pessaries obtained from the office in to identify the proper size. A #3 mushroom pessary feels a weighted too large for gives her good support. She tolerates it sufficiently that A. one side smaller pessary would be ideal. Pessary is removed and patient will be asked to followup in the office after the mushroom pessary can be ordered at the pharmacy [facility]. Foley catheter will be left in place until followup visit. Spinal be contacted to arrange followup in our office ideally Thursday Friday or Tuesday of next week urinary suppression with Macrodantin at bedtime will be recommended. Thank you for this interesting consult

## 2013-02-28 NOTE — ED Notes (Signed)
Patient c/o urinary incontinence while walking around her house; however, only having small dribbles when she goes to bathroom.  Patient also c/o constipation.  Patient takes pain medication and doesn't drink a lot of fluids during day.

## 2013-02-28 NOTE — ED Provider Notes (Signed)
CSN: [id number]     Arrival date & time 02/28/13  4098 History   First MD Initiated Contact with Patient 02/28/13 [phone]     Chief Complaint  Patient presents with  . Urinary Retention   (Consider location/radiation/quality/duration/timing/severity/associated sxs/prior Treatment) HPI Comments: Patient with past medical history significant for hypertension bladder prolapse. She presents to the emergency department this morning with complaints of having difficulty urinating. She states that she is going in small amounts and seems to be going quite frequently. She tells that she feels as if she has to urinate right now but can't. She denies to me she is having any abdominal pain, fevers, chills.  Patient is a 79 y.o. female presenting with dysuria. The history is provided by the patient.  Dysuria Pain quality:  Burning Pain severity:  Moderate Onset quality:  Gradual Timing:  Constant Progression:  Worsening Chronicity:  New Relieved by:  Nothing Worsened by:  Nothing tried Ineffective treatments:  None tried Urinary symptoms: no discolored urine and no foul-smelling urine   Associated symptoms: no abdominal pain, no fever and no flank pain     Past Medical History  Diagnosis Date  . Back pain, chronic   . Headache(784.0)   . Hypertension   . Arthritis   . Meningioma 04/15/2012  . IDA (iron deficiency anemia)   . Diastolic dysfunction Oct 2013    EF 50-55%   . Hypercalcemia   . Bladder prolapse, female, acquired    Past Surgical History  Procedure Laterality Date  . Abdominal hysterectomy    . Arm surgery    . Esophagogastroduodenoscopy  04/18/2012    YNW:GNFAO hiatal hernia/NO OBVIOUS SOURCE FOR PROFOUND ANEMIA IDENTIFIED.MODERATE GASTRITIS CONTRIBUTES TO ANEMIA/no definite stricture identified, empiric dilation performed. + H.PYLORI   No family history on file. History  Substance Use Topics  . Smoking status: Never Smoker   . Smokeless tobacco: Not on file  . Alcohol  Use: No   OB History   Grav Para Term Preterm Abortions TAB SAB Ect Mult Living                 Review of Systems  Constitutional: Negative for fever.  Gastrointestinal: Negative for abdominal pain.  Genitourinary: Positive for dysuria. Negative for flank pain.  All other systems reviewed and are negative.    Allergies  Review of patient's allergies indicates no known allergies.  Home Medications   Current Outpatient Rx  Name  Route  Sig  Dispense  Refill  . amLODipine (NORVASC) 5 MG tablet   Oral   Take 1 tablet (5 mg total) by mouth daily. For blood pressure   30 tablet   3   . docusate sodium (COLACE) 100 MG capsule   Oral   Take 100 mg by mouth daily as needed for constipation.         . enalapril (VASOTEC) 10 MG tablet   Oral   Take 1 tablet (10 mg total) by mouth 2 (two) times daily.   60 tablet   3   . HYDROcodone-acetaminophen (NORCO) 7.5-325 MG per tablet   Oral   Take 1 tablet by mouth every 6 (six) hours as needed for pain.         Marland Kitchen nystatin cream (MYCOSTATIN)   Topical   Apply topically 2 (two) times daily. For 2 weeks   30 g   0   . pantoprazole (PROTONIX) 40 MG tablet   Oral   Take 1 tablet (40 mg total) by  mouth daily. 30 minutes before breakfast for GERD   30 tablet   6    BP 131/74  Pulse 61  Temp(Src) 97.9 F (36.6 C) (Oral)  Resp 18  Wt 100 lb (45.36 kg)  BMI 16.64 kg/m2  SpO2 95% Physical Exam  Nursing note and vitals reviewed. Constitutional: She is oriented to person, place, and time. She appears well-developed and well-nourished. No distress.  HENT:  Head: Normocephalic and atraumatic.  Mouth/Throat: Oropharynx is clear and moist.  Neck: Normal range of motion. Neck supple.  Cardiovascular: Normal rate, regular rhythm and normal heart sounds.   No murmur heard. Pulmonary/Chest: Effort normal and breath sounds normal. No respiratory distress.  Abdominal: Soft. Bowel sounds are normal. She exhibits no distension. There  is tenderness.  There is mild tenderness to palpation in the suprapubic region without rebound or guarding.  Neurological: She is alert and oriented to person, place, and time.  Skin: Skin is warm and dry. She is not diaphoretic.    ED Course  Procedures (including critical care time) Labs Review Labs Reviewed  URINALYSIS, ROUTINE W REFLEX MICROSCOPIC   Imaging Review No results found.  MDM  No diagnosis found. Patient was brought here by EMS for a 2 day history of difficulty urinating. She has a history of bladder prolapse which is unable to be repaired surgically due to the patient's advanced age and comorbidities. I plan to place a Foley catheter however the nurse found a large prolapsed piece of tissue in this region and wanted me to look at it before she did. I examined it feel as though this likely represents both a prolapse of the vagina and the bladder. I spoke with Dr. Emelda Fear from OB/GYN who examined the patient in the ER and was able to place a Foley catheter. Urinalysis was sent to the lab and is pending. Dr. Emelda Fear plans to return once the bladder is drained to attempt placement of a pessary. At this point the care will be signed out to Dr. Lynelle Doctor who will obtain the results of the urinalysis and determine the final disposition.    Geoffery Lyons, MD 02/28/13 [phone]

## 2013-02-28 NOTE — ED Provider Notes (Signed)
Dr Emelda Fear has returned, states he has sized her pessary and he is going to call [facility] to get the appropriate sized pessary ordered and she can see him in the office in about a week, by then the new pessary should arrive. He suggests leaving the foley catheter in place to go home and treat with antibiotics. She may need a preventative antibiotic such as macrobid.    Madison Albe, MD, Armando Gang   Ward Givens, MD 02/28/13 1034

## 2013-02-28 NOTE — ED Notes (Signed)
Pt here this morning and had foley cath placed.  Called EMS for confusion and burning at foley site.  Pt slightly confused at this time.  Alert and oriented to self and place.

## 2013-02-28 NOTE — ED Notes (Signed)
Not able to insert catheter due to patient's bladder prolapse. Dr.Delo made aware.

## 2013-02-28 NOTE — ED Provider Notes (Signed)
CSN: [id number]     Arrival date & time 02/28/13  1628 History   First MD Initiated Contact with Patient 02/28/13 1755     Chief Complaint  Patient presents with  . burning with urination    (Consider location/radiation/quality/duration/timing/severity/associated sxs/prior Treatment) Patient is a 79 y.o. female presenting with altered mental status. The history is provided by a caregiver (pt seen earlier today and saw dr. Emelda Fear who had a foley placed and put her on keflex.  fitted her with a pessary. pt returned for dysuria.  she is oriented to person only.  this is her normal).  Altered Mental Status Presenting symptoms: no combativeness   Severity:  Severe Most recent episode:  More than 2 days ago Episode history:  Continuous Timing:  Constant Progression:  Unchanged   Past Medical History  Diagnosis Date  . Back pain, chronic   . Headache(784.0)   . Hypertension   . Arthritis   . Meningioma 04/15/2012  . IDA (iron deficiency anemia)   . Diastolic dysfunction Oct 2013    EF 50-55%   . Hypercalcemia   . Bladder prolapse, female, acquired    Past Surgical History  Procedure Laterality Date  . Abdominal hysterectomy    . Arm surgery    . Esophagogastroduodenoscopy  04/18/2012    WUJ:WJXBJ hiatal hernia/NO OBVIOUS SOURCE FOR PROFOUND ANEMIA IDENTIFIED.MODERATE GASTRITIS CONTRIBUTES TO ANEMIA/no definite stricture identified, empiric dilation performed. + H.PYLORI   No family history on file. History  Substance Use Topics  . Smoking status: Never Smoker   . Smokeless tobacco: Not on file  . Alcohol Use: No   OB History   Grav Para Term Preterm Abortions TAB SAB Ect Mult Living                 Review of Systems  Unable to perform ROS: Dementia    Allergies  Review of patient's allergies indicates no known allergies.  Home Medications   Current Outpatient Rx  Name  Route  Sig  Dispense  Refill  . amLODipine (NORVASC) 5 MG tablet   Oral   Take 1 tablet (5  mg total) by mouth daily. For blood pressure   30 tablet   3   . docusate sodium (COLACE) 100 MG capsule   Oral   Take 100 mg by mouth daily as needed for constipation.         . enalapril (VASOTEC) 10 MG tablet   Oral   Take 1 tablet (10 mg total) by mouth 2 (two) times daily.   60 tablet   3   . HYDROcodone-acetaminophen (NORCO) 7.5-325 MG per tablet   Oral   Take 1 tablet by mouth every 6 (six) hours as needed for pain.         Marland Kitchen nystatin cream (MYCOSTATIN)   Topical   Apply topically 2 (two) times daily. For 2 weeks   30 g   0   . pantoprazole (PROTONIX) 40 MG tablet   Oral   Take 1 tablet (40 mg total) by mouth daily. 30 minutes before breakfast for GERD   30 tablet   6   . cephALEXin (KEFLEX) 500 MG capsule   Oral   Take 1 capsule (500 mg total) by mouth 3 (three) times daily.   21 capsule   0    BP 134/74  Pulse 77  Temp(Src) 98.5 F (36.9 C) (Oral)  Resp 22  SpO2 96% Physical Exam  Constitutional: She appears well-developed.  HENT:  Head: Normocephalic.  Eyes: Conjunctivae and EOM are normal. No scleral icterus.  Neck: Neck supple. No thyromegaly present.  Cardiovascular: Normal rate and regular rhythm.  Exam reveals no gallop and no friction rub.   No murmur heard. Pulmonary/Chest: No stridor. She has no wheezes. She has no rales. She exhibits no tenderness.  Abdominal: There is no tenderness. There is no rebound.  Musculoskeletal: Normal range of motion. She exhibits no edema.  Lymphadenopathy:    She has no cervical adenopathy.  Neurological: Coordination normal.  Oriented to person only  Skin: No rash noted. No erythema.    ED Course  Procedures (including critical care time) Labs Review Labs Reviewed - No data to display Imaging Review No results found.  MDM  Will send pt back with foley and same instructions as earlier today 1. Dementia   2. Dysuria       [name], MD 02/28/13 [phone]

## 2013-03-01 LAB — URINE CULTURE: Colony Count: >=100000

## 2013-03-07 ENCOUNTER — Telehealth: Payer: Self-pay | Admitting: Family Medicine

## 2013-03-07 ENCOUNTER — Telehealth: Payer: Self-pay | Admitting: Obstetrics and Gynecology

## 2013-03-07 DIAGNOSIS — N390 Urinary tract infection, site not specified: Secondary | ICD-10-CM

## 2013-03-07 NOTE — Telephone Encounter (Signed)
Dr. Emelda Fear ordered pessary today, pharmacy to contact pt in regards to pessary order and to be picked up in time for pt to keep her appt for tomorrow.

## 2013-03-07 NOTE — Telephone Encounter (Signed)
Pt is with Madison Hickman , i recently signed off on [facility] orders for her, I do not recall any d/c orders. Please Call Madison south- I think she is [employer] and see what is going on. There has been a lot of confusion with this family. Initially Madison Hickman stopped going because pt would not be present at home when they came, but I recently restated services

## 2013-03-07 NOTE — Telephone Encounter (Signed)
Son says the Home health nurse has stopped coming.  Course of care completed he guesses.  Then states she [facility] nurse) said she could continue to come if you approve it and is calling to see if you have.  I told him I see no request from anyone in regards to continuing home care.  I asked him what type care does mother still need to warrant additional nursing visits. Son does not know???  Son could offer no answers to any of my questions.  I did tell him that Home health is not a long on going care.  Most insurances only pay for short term visits and then it is done unless there is a reason to continue.   Had appt here 02/03/13 which was canceled.  Has had three ED visits since then.  Please advise or call son.

## 2013-03-07 NOTE — Telephone Encounter (Signed)
I called Madison Hickman at [facility].  She is going to look into what is the situation with [name].  She was aware there were some issues earlier with her not being home at time of services and communication with the family.  She is going to call me back and let me know what status is.

## 2013-03-08 ENCOUNTER — Ambulatory Visit: Payer: Medicare Other | Admitting: Obstetrics and Gynecology

## 2013-03-09 NOTE — Telephone Encounter (Signed)
[facility] called back.  Stated patient has been discharged by them about two weeks ago.  They did resolve some of their issues but had a lot of non compliant issues also.  She said she would be happy to discuss with you if you wanted to call her.  Marya Landry, [facility], [phone].

## 2013-03-10 NOTE — Telephone Encounter (Signed)
Reviewed, notes also reviewed from [facility]

## 2013-03-10 NOTE — Telephone Encounter (Signed)
Cari from [facility] came by to discuss patient status with home health.  Patient had been discharged by them due to insufficient need for care.  But now has been discovered that patient has indwelling foley catheter from ED visits and is pending more procedures with GYN for bladder prolapse condition.  This skill will allow Home Health to go back into the home and assist patient again.  Home Health reordered and doctor has signed referral form for Home health.  Patient is aware that [facility] will be returning

## 2013-03-12 ENCOUNTER — Encounter (HOSPITAL_COMMUNITY): Payer: Self-pay

## 2013-03-12 ENCOUNTER — Emergency Department (HOSPITAL_COMMUNITY)
Admission: EM | Admit: 2013-03-12 | Discharge: 2013-03-12 | Disposition: A | Discharge status: Home / Self Care | Payer: Medicare Other | Attending: Emergency Medicine | Admitting: Emergency Medicine

## 2013-03-12 DIAGNOSIS — Z79899 Other long term (current) drug therapy: Secondary | ICD-10-CM | POA: Insufficient documentation

## 2013-03-12 DIAGNOSIS — N8111 Cystocele, midline: Secondary | ICD-10-CM | POA: Insufficient documentation

## 2013-03-12 DIAGNOSIS — Z9889 Other specified postprocedural states: Secondary | ICD-10-CM | POA: Insufficient documentation

## 2013-03-12 DIAGNOSIS — Z90710 Acquired absence of both cervix and uterus: Secondary | ICD-10-CM | POA: Insufficient documentation

## 2013-03-12 DIAGNOSIS — I10 Essential (primary) hypertension: Secondary | ICD-10-CM | POA: Insufficient documentation

## 2013-03-12 DIAGNOSIS — Z862 Personal history of diseases of the blood and blood-forming organs and certain disorders involving the immune mechanism: Secondary | ICD-10-CM | POA: Insufficient documentation

## 2013-03-12 DIAGNOSIS — R339 Retention of urine, unspecified: Secondary | ICD-10-CM | POA: Insufficient documentation

## 2013-03-12 DIAGNOSIS — N390 Urinary tract infection, site not specified: Secondary | ICD-10-CM | POA: Insufficient documentation

## 2013-03-12 DIAGNOSIS — M129 Arthropathy, unspecified: Secondary | ICD-10-CM | POA: Insufficient documentation

## 2013-03-12 DIAGNOSIS — N8110 Cystocele, unspecified: Secondary | ICD-10-CM

## 2013-03-12 DIAGNOSIS — Z8661 Personal history of infections of the central nervous system: Secondary | ICD-10-CM | POA: Insufficient documentation

## 2013-03-12 DIAGNOSIS — G8929 Other chronic pain: Secondary | ICD-10-CM | POA: Insufficient documentation

## 2013-03-12 LAB — URINALYSIS, ROUTINE W REFLEX MICROSCOPIC
Bilirubin Urine: NEGATIVE
Glucose, UA: NEGATIVE mg/dL
Ketones, ur: NEGATIVE mg/dL
pH: 7.5 (ref 5.0–8.0)

## 2013-03-12 LAB — BASIC METABOLIC PANEL
BUN: 15 mg/dL (ref 6–23)
CO2: 24 mEq/L (ref 19–32)
Chloride: 106 mEq/L (ref 96–112)
Creatinine, Ser: 0.77 mg/dL (ref 0.50–1.10)

## 2013-03-12 LAB — CBC WITH DIFFERENTIAL/PLATELET
Basophils Absolute: 0 10*3/uL (ref 0.0–0.1)
Basophils Relative: 0 % (ref 0–1)
Eosinophils Absolute: 0.2 10*3/uL (ref 0.0–0.7)
Eosinophils Relative: 2 % (ref 0–5)
HCT: 35.4 % — ABNORMAL LOW (ref 36.0–46.0)
Lymphocytes Relative: 11 % — ABNORMAL LOW (ref 12–46)
MCH: 29.2 pg (ref 26.0–34.0)
MCHC: 32.5 g/dL (ref 30.0–36.0)
MCV: 89.8 fL (ref 78.0–100.0)
Monocytes Absolute: 0.4 10*3/uL (ref 0.1–1.0)
RDW: 14 % (ref 11.5–15.5)

## 2013-03-12 LAB — URINE MICROSCOPIC-ADD ON

## 2013-03-12 MED ORDER — SODIUM CHLORIDE 0.9 % IV SOLN: Freq: Once | INTRAVENOUS | Status: DC

## 2013-03-12 MED ORDER — NITROFURANTOIN MONOHYD MACRO 100 MG PO CAPS: 100.0000 mg | ORAL_CAPSULE | Freq: Two times a day (BID) | ORAL | Status: DC

## 2013-03-12 MED ORDER — NITROFURANTOIN MONOHYD MACRO 100 MG PO CAPS
100.0000 mg | ORAL_CAPSULE | Freq: Two times a day (BID) | ORAL | Status: DC
  Administered 2013-03-12: 100 mg via ORAL
  Filled 2013-03-12: qty 1

## 2013-03-12 MED ORDER — HYDROCODONE-ACETAMINOPHEN 5-325 MG PO TABS
1.0000 | ORAL_TABLET | Freq: Once | ORAL | Status: AC
  Administered 2013-03-12: 1 via ORAL
  Filled 2013-03-12: qty 1

## 2013-03-12 NOTE — ED Notes (Signed)
Pt and family report a sore throat for 2 days, fever and urinary retention for 2 days, pt has a foley in place and has been leaking around her cath, pt also stated that she is swollen in her perineum.

## 2013-03-12 NOTE — ED Provider Notes (Signed)
CSN: [id number]     Arrival date & time 03/12/13  1036 History   This chart was scribed for Donnetta Hutching, MD, by Yevette Edwards, ED Scribe. This patient was seen in room APA03/APA03 and the patient's care was started at 11:59 AM. First MD Initiated Contact with Patient 03/12/13 1132     Chief Complaint  Patient presents with  . Sore Throat  . Fever  . Urinary Retention   (Consider location/radiation/quality/duration/timing/severity/associated sxs/prior Treatment) The history is provided by the patient. No language interpreter was used.   HPI Comments: Madison Hickman is a 79 y.o. female who presents to the Emergency Department complaining of a leakage from her cath associated with pain and swelling to her perineum. Her son reports that he is concerned with the difficulty she has swallowing; she has a h/o esophagus dilation. The pt reports that she has also experienced bilateral otalgia and a sore throat which has been present for two days. She states that she has treated her otalgia without relief.  The pt is scheduled for surgery in ten days, on March 22, 2013, for a prolapsed uterus or bladder.   Dr. Randa Evens is her PCP.   Past Medical History  Diagnosis Date  . Back pain, chronic   . Headache(784.0)   . Hypertension   . Arthritis   . Meningioma 04/15/2012  . IDA (iron deficiency anemia)   . Diastolic dysfunction Oct 2013    EF 50-55%   . Hypercalcemia   . Bladder prolapse, female, acquired    Past Surgical History  Procedure Laterality Date  . Abdominal hysterectomy    . Arm surgery    . Esophagogastroduodenoscopy  04/18/2012    WUJ:WJXBJ hiatal hernia/NO OBVIOUS SOURCE FOR PROFOUND ANEMIA IDENTIFIED.MODERATE GASTRITIS CONTRIBUTES TO ANEMIA/no definite stricture identified, empiric dilation performed. + H.PYLORI   No family history on file. History  Substance Use Topics  . Smoking status: Never Smoker   . Smokeless tobacco: Not on file  . Alcohol Use: No   No OB history  provided.  Review of Systems  All other systems reviewed and are negative.    Allergies  Review of patient's allergies indicates no known allergies.  Home Medications   Current Outpatient Rx  Name  Route  Sig  Dispense  Refill  . amLODipine (NORVASC) 5 MG tablet   Oral   Take 1 tablet (5 mg total) by mouth daily. For blood pressure   30 tablet   3   . calcium carbonate (TUMS - DOSED IN MG ELEMENTAL CALCIUM) 500 MG chewable tablet   Oral   Chew 1 tablet by mouth daily as needed for heartburn.         . docusate sodium (COLACE) 100 MG capsule   Oral   Take 100 mg by mouth daily as needed for constipation.         . enalapril (VASOTEC) 10 MG tablet   Oral   Take 1 tablet (10 mg total) by mouth 2 (two) times daily.   60 tablet   3   . HYDROcodone-acetaminophen (NORCO) 7.5-325 MG per tablet   Oral   Take 1 tablet by mouth every 6 (six) hours as needed for pain.         . pantoprazole (PROTONIX) 40 MG tablet   Oral   Take 1 tablet (40 mg total) by mouth daily. 30 minutes before breakfast for GERD   30 tablet   6   . cephALEXin (KEFLEX) 500 MG capsule  Oral   Take 1 capsule (500 mg total) by mouth 3 (three) times daily.   21 capsule   0    Triage Vitals: BP 151/79  Pulse 74  Temp(Src) 97.7 F (36.5 C) (Oral)  Resp 16  Ht 5' 5.5" (1.664 m)  Wt 95 lb (43.092 kg)  BMI 15.56 kg/m2  SpO2 95%  Physical Exam  Nursing note and vitals reviewed. Constitutional: She is oriented to person, place, and time. She appears well-developed and well-nourished.  HENT:  Head: Normocephalic and atraumatic.  Eyes: Conjunctivae and EOM are normal. Pupils are equal, round, and reactive to light.  Neck: Normal range of motion. Neck supple.  Cardiovascular: Normal rate, regular rhythm and normal heart sounds.   Pulmonary/Chest: Effort normal and breath sounds normal.  Abdominal: Soft. Bowel sounds are normal.  Genitourinary:  Chaperoned exam. Prolapse of bladder.    Musculoskeletal: Normal range of motion.  Neurological: She is alert and oriented to person, place, and time.  Skin: Skin is warm and dry.  Psychiatric: She has a normal mood and affect.    ED Course  Procedures (including critical care time)  DIAGNOSTIC STUDIES: Oxygen Saturation is 95% on room air, adequate by my interpretation.    COORDINATION OF CARE:  12:05 PM- Discussed treatment plan with patient, and the patient agreed to the plan.   Labs Review Labs Reviewed  BASIC METABOLIC PANEL - Abnormal; Notable for the following:    Glucose, Bld 114 (*)    Calcium 10.7 (*)    GFR calc non Af Amer 73 (*)    GFR calc Af Amer 85 (*)    All other components within normal limits  CBC WITH DIFFERENTIAL - Abnormal; Notable for the following:    Hemoglobin 11.5 (*)    HCT 35.4 (*)    Neutrophils Relative % 82 (*)    Lymphocytes Relative 11 (*)    All other components within normal limits  URINALYSIS, ROUTINE W REFLEX MICROSCOPIC   Imaging Review No results found.  MDM  No diagnosis found. Patient has obvious prolapsed bladder. Spoke with the gynecologist on call. She will followup with her primary gynecologist this week. Macrobid started for minor urinary tract infection. Discussed with patient and her son.  I personally performed the services described in this documentation, which was scribed in my presence. The recorded information has been reviewed and is accurate.      Donnetta Hutching, MD 03/12/13 [phone]

## 2013-03-16 ENCOUNTER — Emergency Department (HOSPITAL_COMMUNITY)
Admission: EM | Admit: 2013-03-16 | Discharge: 2013-03-16 | Disposition: A | Discharge status: Home / Self Care | Payer: Medicare Other | Attending: Emergency Medicine | Admitting: Emergency Medicine

## 2013-03-16 ENCOUNTER — Encounter (HOSPITAL_COMMUNITY): Payer: Self-pay

## 2013-03-16 ENCOUNTER — Telehealth: Payer: Self-pay | Admitting: Family Medicine

## 2013-03-16 DIAGNOSIS — Z87448 Personal history of other diseases of urinary system: Secondary | ICD-10-CM | POA: Insufficient documentation

## 2013-03-16 DIAGNOSIS — Z862 Personal history of diseases of the blood and blood-forming organs and certain disorders involving the immune mechanism: Secondary | ICD-10-CM | POA: Insufficient documentation

## 2013-03-16 DIAGNOSIS — G8929 Other chronic pain: Secondary | ICD-10-CM | POA: Insufficient documentation

## 2013-03-16 DIAGNOSIS — Z8669 Personal history of other diseases of the nervous system and sense organs: Secondary | ICD-10-CM | POA: Insufficient documentation

## 2013-03-16 DIAGNOSIS — T83091A Other mechanical complication of indwelling urethral catheter, initial encounter: Secondary | ICD-10-CM | POA: Insufficient documentation

## 2013-03-16 DIAGNOSIS — M549 Dorsalgia, unspecified: Secondary | ICD-10-CM | POA: Insufficient documentation

## 2013-03-16 DIAGNOSIS — I10 Essential (primary) hypertension: Secondary | ICD-10-CM | POA: Insufficient documentation

## 2013-03-16 DIAGNOSIS — Z8639 Personal history of other endocrine, nutritional and metabolic disease: Secondary | ICD-10-CM | POA: Insufficient documentation

## 2013-03-16 DIAGNOSIS — M129 Arthropathy, unspecified: Secondary | ICD-10-CM | POA: Insufficient documentation

## 2013-03-16 DIAGNOSIS — Z79899 Other long term (current) drug therapy: Secondary | ICD-10-CM | POA: Insufficient documentation

## 2013-03-16 DIAGNOSIS — R300 Dysuria: Secondary | ICD-10-CM | POA: Insufficient documentation

## 2013-03-16 DIAGNOSIS — Y846 Urinary catheterization as the cause of abnormal reaction of the patient, or of later complication, without mention of misadventure at the time of the procedure: Secondary | ICD-10-CM | POA: Insufficient documentation

## 2013-03-16 DIAGNOSIS — T839XXA Unspecified complication of genitourinary prosthetic device, implant and graft, initial encounter: Secondary | ICD-10-CM

## 2013-03-16 LAB — URINE CULTURE

## 2013-03-16 NOTE — ED Provider Notes (Signed)
CSN: [id number]     Arrival date & time 03/16/13  0007 History   First MD Initiated Contact with Patient 03/16/13 0028     Chief Complaint  Patient presents with  . Foley Cath Complaint    (Consider location/radiation/quality/duration/timing/severity/associated sxs/prior Treatment) The history is provided by the patient and the EMS personnel.   79 year old female known bladder prolapse Foley catheter in place long-term due to this and having trouble with urinary retention or incontinence. Patient felt that the catheter came out tonight because there was leakage of fluid. Patient without any other complaints. Patient has a history of hypertension. Currently denies any shortness of breath chest pain headache or any focal deficits.  Past Medical History  Diagnosis Date  . Back pain, chronic   . Headache(784.0)   . Hypertension   . Arthritis   . Meningioma 04/15/2012  . IDA (iron deficiency anemia)   . Diastolic dysfunction Oct 2013    EF 50-55%   . Hypercalcemia   . Bladder prolapse, female, acquired    Past Surgical History  Procedure Laterality Date  . Abdominal hysterectomy    . Arm surgery    . Esophagogastroduodenoscopy  04/18/2012    WUJ:WJXBJ hiatal hernia/NO OBVIOUS SOURCE FOR PROFOUND ANEMIA IDENTIFIED.MODERATE GASTRITIS CONTRIBUTES TO ANEMIA/no definite stricture identified, empiric dilation performed. + H.PYLORI   History reviewed. No pertinent family history. History  Substance Use Topics  . Smoking status: Never Smoker   . Smokeless tobacco: Not on file  . Alcohol Use: No   OB History   Grav Para Term Preterm Abortions TAB SAB Ect Mult Living                 Review of Systems  Constitutional: Negative for fever.  HENT: Negative for congestion.   Eyes: Negative for redness.  Respiratory: Negative for shortness of breath.   Cardiovascular: Negative for chest pain.  Gastrointestinal: Negative for abdominal pain.  Genitourinary: Positive for difficulty  urinating. Negative for dysuria and hematuria.  Musculoskeletal: Negative for back pain.  Skin: Negative for rash.  Neurological: Negative for headaches.  Hematological: Does not bruise/bleed easily.  Psychiatric/Behavioral: Negative for confusion.    Allergies  Review of patient's allergies indicates no known allergies.  Home Medications   Current Outpatient Rx  Name  Route  Sig  Dispense  Refill  . amLODipine (NORVASC) 5 MG tablet   Oral   Take 1 tablet (5 mg total) by mouth daily. For blood pressure   30 tablet   3   . calcium carbonate (TUMS - DOSED IN MG ELEMENTAL CALCIUM) 500 MG chewable tablet   Oral   Chew 1 tablet by mouth daily as needed for heartburn.         . cephALEXin (KEFLEX) 500 MG capsule   Oral   Take 1 capsule (500 mg total) by mouth 3 (three) times daily.   21 capsule   0   . docusate sodium (COLACE) 100 MG capsule   Oral   Take 100 mg by mouth daily as needed for constipation.         . enalapril (VASOTEC) 10 MG tablet   Oral   Take 1 tablet (10 mg total) by mouth 2 (two) times daily.   60 tablet   3   . HYDROcodone-acetaminophen (NORCO) 7.5-325 MG per tablet   Oral   Take 1 tablet by mouth every 6 (six) hours as needed for pain.         . nitrofurantoin, macrocrystal-monohydrate, (  MACROBID) 100 MG capsule   Oral   Take 1 capsule (100 mg total) by mouth 2 (two) times daily. X 7 days   14 capsule   0   . pantoprazole (PROTONIX) 40 MG tablet   Oral   Take 1 tablet (40 mg total) by mouth daily. 30 minutes before breakfast for GERD   30 tablet   6    BP 163/118  Pulse 68  Temp(Src) 98.2 F (36.8 C) (Oral)  Resp 18  Ht 5\' 5"  (1.651 m)  Wt 100 lb (45.36 kg)  BMI 16.64 kg/m2  SpO2 95% Physical Exam  Nursing note and vitals reviewed. Constitutional: She is oriented to person, place, and time. She appears well-developed and well-nourished. No distress.  HENT:  Head: Normocephalic and atraumatic.  Mouth/Throat: Oropharynx is  clear and moist.  Eyes: Conjunctivae and EOM are normal.  Neck: Normal range of motion.  Cardiovascular: Normal rate, regular rhythm and normal heart sounds.   No murmur heard. Pulmonary/Chest: Effort normal and breath sounds normal. No respiratory distress.  Abdominal: Soft. Bowel sounds are normal. There is no tenderness.  Genitourinary:  Obvious bladder prolapse. Foley catheter in place and draining properly urine looks clear not cloudy.  Musculoskeletal: Normal range of motion. She exhibits no edema.  Neurological: She is alert and oriented to person, place, and time. No cranial nerve deficit. She exhibits normal muscle tone. Coordination normal.  Skin: Skin is warm.    ED Course  Procedures (including critical care time) Labs Review Labs Reviewed - No data to display Imaging Review No results found.  MDM   1. Foley catheter problem, initial encounter    Patient bladder prolapse has a Foley catheter in place due to this. Patient felt that the catheter at come out tonight because she had leakage of urine. Catheters currently functioning fine good drainage of urine no leakage. Patient without any other specific complaints. No fever. Blood pressure is elevated tissue has a history of hypertension no systemic signs of complications from hypertension no significant shortness of breath no severe headache no chest pain. Patient can followup for the blood pressure with her primary care Dr.     Shelda Jakes, MD 03/16/13 [phone]
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
## 2013-03-16 NOTE — Telephone Encounter (Signed)
Daughter says that pt is calling 911 every other day and going to the ER. They see her for a few minutes and then send her home. Daughter says that she cannot keep doing this and wants to talk to you about getting her in a nursing home or something like that. Can you please call her when you get a chance?

## 2013-03-16 NOTE — ED Notes (Signed)
She said that her catheter came out. Said that there was water every where. States that she felt "down there" and it was out. She has bladder issues and is supposed to have surgery.

## 2013-03-17 NOTE — ED Notes (Addendum)
Post ED Visit - Positive Culture Follow-up: Successful Patient Follow-Up  Culture assessed and recommendations reviewed by: []  Wes Dulaney, Pharm.D., BCPS [x]  Celedonio Miyamoto, Pharm.D., BCPS []  Georgina Pillion, Pharm.D., BCPS []  [place], [place].D., BCPS, AAHIVP []  Estella Husk, Pharm.D., BCPS, AAHIVP  Positive  Urine culture  []  Patient discharged without antimicrobial prescription and treatment is now indicated [x]  Organism is resistant to prescribed ED discharge antimicrobial []  Patient with positive blood cultures  Changes discussed with ED provider: Laneta Simmers New antibiotic prescription Bactrim Bid x 5 days Stop Macrobid   Larena Sox 03/17/2013, 1:16 PM

## 2013-03-17 NOTE — Progress Notes (Signed)
ED Antimicrobial Stewardship Positive Culture Follow Up   Madison Hickman is an 79 y.o. female who presented to [facility] on 03/16/2013 with a chief complaint of  Chief Complaint  Patient presents with  . Foley Cath Complaint     Recent Results (from the past 720 hour(s))  URINE CULTURE     Status: None   Collection Time    02/28/13  7:25 AM      Result Value Range Status   Specimen Description URINE, CATHETERIZED   Final   Special Requests NONE   Final   Culture  Setup Time     Final   Value: 02/28/2013 10:30     Performed at [facility] Count     Final   Value: >=100,000 COLONIES/ML     Performed at [facility]   Culture     Final   Value: Multiple bacterial morphotypes present, none predominant. Suggest appropriate recollection if clinically indicated.     Performed at [facility]   Report Status 03/01/2013 FINAL   Final  URINE CULTURE     Status: None   Collection Time    03/12/13 11:55 AM      Result Value Range Status   Specimen Description URINE, CLEAN CATCH   Final   Special Requests NONE   Final   Culture  Setup Time     Final   Value: 03/12/2013 21:18     Performed at [facility] Count     Final   Value: >=100,000 COLONIES/ML     Performed at [facility]   Culture     Final   Value: ESCHERICHIA COLI     MORGANELLA MORGANII     Performed at [facility]   Report Status 03/16/2013 FINAL   Final   Organism ID, Bacteria ESCHERICHIA COLI   Final   Organism ID, Bacteria MORGANELLA MORGANII   Final    [x]  Treated with Macrobid, organism resistant to prescribed antimicrobial []  Patient discharged originally without antimicrobial agent and treatment is now indicated  New antibiotic prescription: Bactrim single strength 1 tablet BID x 5 days.  Stop taking MacroBID  ED Provider: Johnnette Gourd, PA-C   Mickeal Skinner 03/17/2013, 9:25 AM Infectious Diseases Pharmacist Phone#  [phone]

## 2013-03-19 ENCOUNTER — Emergency Department (HOSPITAL_COMMUNITY): Payer: Medicare Other

## 2013-03-19 ENCOUNTER — Inpatient Hospital Stay (HOSPITAL_COMMUNITY)
Admission: EM | Admit: 2013-03-19 | Discharge: 2013-03-22 | DRG: 308 | Disposition: A | Discharge status: Skilled Nursing Facility | Payer: Medicare Other | Attending: Internal Medicine | Admitting: Internal Medicine

## 2013-03-19 ENCOUNTER — Telehealth (HOSPITAL_COMMUNITY): Payer: Self-pay | Admitting: Emergency Medicine

## 2013-03-19 ENCOUNTER — Encounter (HOSPITAL_COMMUNITY): Payer: Self-pay | Admitting: *Deleted

## 2013-03-19 ENCOUNTER — Inpatient Hospital Stay (HOSPITAL_COMMUNITY): Payer: Medicare Other

## 2013-03-19 DIAGNOSIS — E041 Nontoxic single thyroid nodule: Secondary | ICD-10-CM | POA: Diagnosis present

## 2013-03-19 DIAGNOSIS — R109 Unspecified abdominal pain: Secondary | ICD-10-CM

## 2013-03-19 DIAGNOSIS — E538 Deficiency of other specified B group vitamins: Secondary | ICD-10-CM

## 2013-03-19 DIAGNOSIS — I509 Heart failure, unspecified: Secondary | ICD-10-CM | POA: Diagnosis present

## 2013-03-19 DIAGNOSIS — G8929 Other chronic pain: Secondary | ICD-10-CM

## 2013-03-19 DIAGNOSIS — E872 Acidosis, unspecified: Secondary | ICD-10-CM | POA: Diagnosis present

## 2013-03-19 DIAGNOSIS — I5032 Chronic diastolic (congestive) heart failure: Secondary | ICD-10-CM

## 2013-03-19 DIAGNOSIS — R600 Localized edema: Secondary | ICD-10-CM

## 2013-03-19 DIAGNOSIS — I517 Cardiomegaly: Secondary | ICD-10-CM | POA: Diagnosis present

## 2013-03-19 DIAGNOSIS — M25519 Pain in unspecified shoulder: Secondary | ICD-10-CM | POA: Diagnosis present

## 2013-03-19 DIAGNOSIS — M1990 Unspecified osteoarthritis, unspecified site: Secondary | ICD-10-CM | POA: Diagnosis present

## 2013-03-19 DIAGNOSIS — G936 Cerebral edema: Secondary | ICD-10-CM

## 2013-03-19 DIAGNOSIS — N8111 Cystocele, midline: Secondary | ICD-10-CM | POA: Diagnosis present

## 2013-03-19 DIAGNOSIS — I4891 Unspecified atrial fibrillation: Principal | ICD-10-CM

## 2013-03-19 DIAGNOSIS — Z681 Body mass index (BMI) 19 or less, adult: Secondary | ICD-10-CM

## 2013-03-19 DIAGNOSIS — D329 Benign neoplasm of meninges, unspecified: Secondary | ICD-10-CM

## 2013-03-19 DIAGNOSIS — R1013 Epigastric pain: Secondary | ICD-10-CM

## 2013-03-19 DIAGNOSIS — M549 Dorsalgia, unspecified: Secondary | ICD-10-CM | POA: Diagnosis present

## 2013-03-19 DIAGNOSIS — R079 Chest pain, unspecified: Secondary | ICD-10-CM

## 2013-03-19 DIAGNOSIS — R21 Rash and other nonspecific skin eruption: Secondary | ICD-10-CM

## 2013-03-19 DIAGNOSIS — R609 Edema, unspecified: Secondary | ICD-10-CM

## 2013-03-19 DIAGNOSIS — D509 Iron deficiency anemia, unspecified: Secondary | ICD-10-CM

## 2013-03-19 DIAGNOSIS — E611 Iron deficiency: Secondary | ICD-10-CM

## 2013-03-19 DIAGNOSIS — E46 Unspecified protein-calorie malnutrition: Secondary | ICD-10-CM

## 2013-03-19 DIAGNOSIS — R51 Headache: Secondary | ICD-10-CM | POA: Diagnosis present

## 2013-03-19 DIAGNOSIS — R001 Bradycardia, unspecified: Secondary | ICD-10-CM

## 2013-03-19 DIAGNOSIS — Z86011 Personal history of benign neoplasm of the brain: Secondary | ICD-10-CM

## 2013-03-19 DIAGNOSIS — R627 Adult failure to thrive: Secondary | ICD-10-CM

## 2013-03-19 DIAGNOSIS — N390 Urinary tract infection, site not specified: Secondary | ICD-10-CM

## 2013-03-19 DIAGNOSIS — E861 Hypovolemia: Secondary | ICD-10-CM | POA: Diagnosis present

## 2013-03-19 DIAGNOSIS — E860 Dehydration: Secondary | ICD-10-CM

## 2013-03-19 DIAGNOSIS — R1310 Dysphagia, unspecified: Secondary | ICD-10-CM

## 2013-03-19 DIAGNOSIS — Z66 Do not resuscitate: Secondary | ICD-10-CM | POA: Diagnosis present

## 2013-03-19 DIAGNOSIS — E41 Nutritional marasmus: Secondary | ICD-10-CM | POA: Diagnosis present

## 2013-03-19 DIAGNOSIS — I10 Essential (primary) hypertension: Secondary | ICD-10-CM

## 2013-03-19 DIAGNOSIS — N8110 Cystocele, unspecified: Secondary | ICD-10-CM | POA: Diagnosis present

## 2013-03-19 DIAGNOSIS — K219 Gastro-esophageal reflux disease without esophagitis: Secondary | ICD-10-CM | POA: Diagnosis present

## 2013-03-19 HISTORY — DX: Chronic diastolic (congestive) heart failure: I50.32

## 2013-03-19 HISTORY — DX: Cardiomegaly: I51.7

## 2013-03-19 LAB — URINE MICROSCOPIC-ADD ON

## 2013-03-19 LAB — URINALYSIS, ROUTINE W REFLEX MICROSCOPIC
Bilirubin Urine: NEGATIVE
Glucose, UA: NEGATIVE mg/dL
Ketones, ur: NEGATIVE mg/dL
Leukocytes, UA: NEGATIVE
Nitrite: NEGATIVE
Protein, ur: NEGATIVE mg/dL
Specific Gravity, Urine: 1.02 (ref 1.005–1.030)
Urobilinogen, UA: 0.2 mg/dL (ref 0.0–1.0)
pH: 6 (ref 5.0–8.0)

## 2013-03-19 LAB — HEPATIC FUNCTION PANEL
ALT: 16 U/L (ref 0–35)
AST: 36 U/L (ref 0–37)
Albumin: 3.2 g/dL — ABNORMAL LOW (ref 3.5–5.2)
Alkaline Phosphatase: 64 U/L (ref 39–117)
Total Bilirubin: 0.3 mg/dL (ref 0.3–1.2)

## 2013-03-19 LAB — CBC WITH DIFFERENTIAL/PLATELET
Basophils Absolute: 0 10*3/uL (ref 0.0–0.1)
Basophils Relative: 0 % (ref 0–1)
Eosinophils Absolute: 0.5 10*3/uL (ref 0.0–0.7)
Eosinophils Relative: 4 % (ref 0–5)
HCT: 41.5 % (ref 36.0–46.0)
Hemoglobin: 13.3 g/dL (ref 12.0–15.0)
Lymphocytes Relative: 2 % — ABNORMAL LOW (ref 12–46)
Lymphs Abs: 0.2 10*3/uL — ABNORMAL LOW (ref 0.7–4.0)
MCH: 28.7 pg (ref 26.0–34.0)
MCHC: 32 g/dL (ref 30.0–36.0)
MCV: 89.6 fL (ref 78.0–100.0)
Monocytes Absolute: 0.1 10*3/uL (ref 0.1–1.0)
Monocytes Relative: 1 % — ABNORMAL LOW (ref 3–12)
Neutro Abs: 10.1 10*3/uL — ABNORMAL HIGH (ref 1.7–7.7)
Neutrophils Relative %: 93 % — ABNORMAL HIGH (ref 43–77)
Platelets: 234 10*3/uL (ref 150–400)
RBC: 4.63 MIL/uL (ref 3.87–5.11)
RDW: 14.9 % (ref 11.5–15.5)
WBC: 10.9 10*3/uL — ABNORMAL HIGH (ref 4.0–10.5)

## 2013-03-19 LAB — BASIC METABOLIC PANEL
BUN: 29 mg/dL — ABNORMAL HIGH (ref 6–23)
CO2: 21 mEq/L (ref 19–32)
Calcium: 12.3 mg/dL — ABNORMAL HIGH (ref 8.4–10.5)
Chloride: 103 mEq/L (ref 96–112)
Creatinine, Ser: 0.97 mg/dL (ref 0.50–1.10)
GFR calc Af Amer: 59 mL/min — ABNORMAL LOW (ref >90)
GFR calc non Af Amer: 51 mL/min — ABNORMAL LOW (ref >90)
Glucose, Bld: 161 mg/dL — ABNORMAL HIGH (ref 70–99)
Potassium: 3.7 mEq/L (ref 3.5–5.1)
Sodium: 136 mEq/L (ref 135–145)

## 2013-03-19 LAB — LACTIC ACID, PLASMA: Lactic Acid, Venous: 2.8 mmol/L — ABNORMAL HIGH (ref 0.5–2.2)

## 2013-03-19 LAB — TROPONIN I
Troponin I: <0.3 ng/mL (ref <0.30)
Troponin I: <0.3 ng/mL (ref <0.30)

## 2013-03-19 MED ORDER — IOHEXOL 350 MG/ML SOLN
100.0000 mL | Freq: Once | INTRAVENOUS | Status: AC | PRN
  Administered 2013-03-19: 100 mL via INTRAVENOUS

## 2013-03-19 MED ORDER — ASPIRIN 81 MG PO CHEW
81.0000 mg | CHEWABLE_TABLET | Freq: Every day | ORAL | Status: DC
  Administered 2013-03-19 – 2013-03-22 (×4): 81 mg via ORAL
  Filled 2013-03-19 (×4): qty 1

## 2013-03-19 MED ORDER — SODIUM CHLORIDE 0.9 % IV BOLUS (SEPSIS)
1000.0000 mL | Freq: Once | INTRAVENOUS | Status: AC
  Administered 2013-03-19: 1000 mL via INTRAVENOUS

## 2013-03-19 MED ORDER — SENNA 8.6 MG PO TABS: 1.0000 | ORAL_TABLET | Freq: Every day | ORAL | Status: DC | PRN

## 2013-03-19 MED ORDER — ONDANSETRON HCL 4 MG/2ML IJ SOLN
4.0000 mg | Freq: Once | INTRAMUSCULAR | Status: AC
  Administered 2013-03-19: 4 mg via INTRAVENOUS
  Filled 2013-03-19: qty 2

## 2013-03-19 MED ORDER — ACETAMINOPHEN 325 MG PO TABS
650.0000 mg | ORAL_TABLET | Freq: Four times a day (QID) | ORAL | Status: DC | PRN
  Administered 2013-03-19: 650 mg via ORAL
  Filled 2013-03-19: qty 2

## 2013-03-19 MED ORDER — DEXTROSE 5 % IV SOLN
1.0000 g | INTRAVENOUS | Status: DC
  Administered 2013-03-20 – 2013-03-22 (×3): 1 g via INTRAVENOUS
  Filled 2013-03-19 (×4): qty 10

## 2013-03-19 MED ORDER — SODIUM CHLORIDE 0.9 % IJ SOLN
3.0000 mL | Freq: Two times a day (BID) | INTRAMUSCULAR | Status: DC
  Administered 2013-03-19 – 2013-03-21 (×3): 3 mL via INTRAVENOUS

## 2013-03-19 MED ORDER — ONDANSETRON HCL 4 MG PO TABS: 4.0000 mg | ORAL_TABLET | Freq: Four times a day (QID) | ORAL | Status: DC | PRN

## 2013-03-19 MED ORDER — DEXTROSE 5 % IV SOLN
1.0000 g | Freq: Once | INTRAVENOUS | Status: AC
  Administered 2013-03-19: 1 g via INTRAVENOUS
  Filled 2013-03-19: qty 10

## 2013-03-19 MED ORDER — ONDANSETRON HCL 4 MG/2ML IJ SOLN: 4.0000 mg | Freq: Four times a day (QID) | INTRAMUSCULAR | Status: DC | PRN

## 2013-03-19 MED ORDER — PANTOPRAZOLE SODIUM 40 MG PO TBEC
40.0000 mg | DELAYED_RELEASE_TABLET | Freq: Every day | ORAL | Status: DC
  Administered 2013-03-19 – 2013-03-22 (×4): 40 mg via ORAL
  Filled 2013-03-19 (×4): qty 1

## 2013-03-19 MED ORDER — NITROGLYCERIN 0.4 MG SL SUBL
0.4000 mg | SUBLINGUAL_TABLET | SUBLINGUAL | Status: DC | PRN
  Administered 2013-03-20 – 2013-03-22 (×2): 0.4 mg via SUBLINGUAL
  Filled 2013-03-19: qty 25

## 2013-03-19 MED ORDER — AMLODIPINE BESYLATE 5 MG PO TABS
5.0000 mg | ORAL_TABLET | Freq: Every day | ORAL | Status: DC
  Administered 2013-03-19 – 2013-03-22 (×4): 5 mg via ORAL
  Filled 2013-03-19 (×4): qty 1

## 2013-03-19 MED ORDER — FENTANYL CITRATE 0.05 MG/ML IJ SOLN
25.0000 ug | Freq: Once | INTRAMUSCULAR | Status: AC
  Administered 2013-03-19: 25 ug via INTRAVENOUS
  Filled 2013-03-19: qty 2

## 2013-03-19 MED ORDER — ACETAMINOPHEN 650 MG RE SUPP: 650.0000 mg | Freq: Four times a day (QID) | RECTAL | Status: DC | PRN

## 2013-03-19 MED ORDER — ENOXAPARIN SODIUM 40 MG/0.4ML ~~LOC~~ SOLN
40.0000 mg | SUBCUTANEOUS | Status: DC
  Administered 2013-03-19: 40 mg via SUBCUTANEOUS
  Filled 2013-03-19: qty 0.4

## 2013-03-19 MED ORDER — HYDROCODONE-ACETAMINOPHEN 5-325 MG PO TABS
1.0000 | ORAL_TABLET | ORAL | Status: DC | PRN
  Administered 2013-03-19 – 2013-03-20 (×2): 1 via ORAL
  Administered 2013-03-20 – 2013-03-21 (×6): 2 via ORAL
  Administered 2013-03-22 (×2): 1 via ORAL
  Filled 2013-03-19: qty 1
  Filled 2013-03-19 (×7): qty 2
  Filled 2013-03-19 (×2): qty 1

## 2013-03-19 MED ORDER — SODIUM CHLORIDE 0.9 % IV SOLN
INTRAVENOUS | Status: DC
  Administered 2013-03-19 – 2013-03-21 (×4): via INTRAVENOUS

## 2013-03-19 MED ORDER — ENALAPRIL MALEATE 5 MG PO TABS
10.0000 mg | ORAL_TABLET | Freq: Two times a day (BID) | ORAL | Status: DC
  Administered 2013-03-19 – 2013-03-22 (×6): 10 mg via ORAL
  Filled 2013-03-19 (×6): qty 2

## 2013-03-19 NOTE — ED Provider Notes (Signed)
CSN: [id number]     Arrival date & time 03/19/13  1323 History   First MD Initiated Contact with Patient 03/19/13 1333     Chief Complaint  Patient presents with  . Chest Pain  . Abdominal Pain   (Consider location/radiation/quality/duration/timing/severity/associated sxs/prior Treatment) HPI  79 year old female with multiple complaints. Patient has been feeling "bad" since yesterday evening. Diffuse achy chest pain. Has been relatively constant since onset. Does not radiate. She feels nauseated and short of breath. No cough. Chills. No unusual leg pain or swelling. Associated with tingling in bilateral hands. Also abdominal pain. Diffuse and crampy. Patient recently diagnosed with a urinary tract infection started on nitrofurantoin. Per review her records one of the result organisms is Macrobid resistant. Son states the patient did not start taking Bactrim. Says a Foley catheter in place because of pelvic organ prolapse. He says that the output has been decreased over the past one to 2 days.  Past Medical History  Diagnosis Date  . Back pain, chronic   . Headache(784.0)   . Hypertension   . Arthritis   . Meningioma 04/15/2012  . IDA (iron deficiency anemia)   . Diastolic dysfunction Oct 2013    EF 50-55%   . Hypercalcemia   . Bladder prolapse, female, acquired    Past Surgical History  Procedure Laterality Date  . Abdominal hysterectomy    . Arm surgery    . Esophagogastroduodenoscopy  04/18/2012    WGN:FAOZH hiatal hernia/NO OBVIOUS SOURCE FOR PROFOUND ANEMIA IDENTIFIED.MODERATE GASTRITIS CONTRIBUTES TO ANEMIA/no definite stricture identified, empiric dilation performed. + H.PYLORI   No family history on file. History  Substance Use Topics  . Smoking status: Never Smoker   . Smokeless tobacco: Not on file  . Alcohol Use: No   OB History   Grav Para Term Preterm Abortions TAB SAB Ect Mult Living                 Review of Systems  All systems reviewed and negative,  other than as noted in HPI.  Allergies  Review of patient's allergies indicates no known allergies.  Home Medications   Current Outpatient Rx  Name  Route  Sig  Dispense  Refill  . amLODipine (NORVASC) 5 MG tablet   Oral   Take 1 tablet (5 mg total) by mouth daily. For blood pressure   30 tablet   3   . calcium carbonate (TUMS - DOSED IN MG ELEMENTAL CALCIUM) 500 MG chewable tablet   Oral   Chew 1 tablet by mouth daily as needed for heartburn.         . docusate sodium (COLACE) 100 MG capsule   Oral   Take 100 mg by mouth daily as needed for constipation.         . enalapril (VASOTEC) 10 MG tablet   Oral   Take 1 tablet (10 mg total) by mouth 2 (two) times daily.   60 tablet   3   . HYDROcodone-acetaminophen (NORCO) 7.5-325 MG per tablet   Oral   Take 1 tablet by mouth every 6 (six) hours as needed for pain.         Marland Kitchen ibuprofen (ADVIL,MOTRIN) 200 MG tablet   Oral   Take 800 mg by mouth daily as needed for pain.         . nitrofurantoin, macrocrystal-monohydrate, (MACROBID) 100 MG capsule   Oral   Take 1 capsule (100 mg total) by mouth 2 (two) times daily. X 7 days  14 capsule   0   . pantoprazole (PROTONIX) 40 MG tablet   Oral   Take 1 tablet (40 mg total) by mouth daily. 30 minutes before breakfast for GERD   30 tablet   6   . cephALEXin (KEFLEX) 500 MG capsule   Oral   Take 1 capsule (500 mg total) by mouth 3 (three) times daily.   21 capsule   0    BP 121/81  Pulse 73  Temp(Src) 98.2 F (36.8 C) (Oral)  Resp 20  SpO2 95% Physical Exam  Nursing note and vitals reviewed. Constitutional:  Chronically ill and frail appearing. Seems somewhat uncomfortable.  HENT:  Head: Normocephalic and atraumatic.  Dry mucous membranes  Eyes: Conjunctivae are normal. Pupils are equal, round, and reactive to light. Right eye exhibits no discharge. Left eye exhibits no discharge.  Neck: Neck supple.  Cardiovascular: Normal heart sounds.  Exam reveals no  gallop and no friction rub.   No murmur heard. Irregularly irregular rhythm. Mild tachycardia.  Pulmonary/Chest: She exhibits no tenderness.  Tachypnea. Breath sounds clear bilaterally. No accessory muscle usage. Chest pain is not reproducible to palpation.  Abdominal: Soft. She exhibits no distension. There is no tenderness.  Musculoskeletal: She exhibits no edema and no tenderness.  Lower extremities symmetric as compared to each other. No calf tenderness. Negative Homan's. No palpable cords.   Neurological: She is alert.  Skin: There is pallor.  Poor skin turgor  Psychiatric: She has a normal mood and affect. Her behavior is normal. Thought content normal.    ED Course  Procedures (including critical care time) Labs Review Labs Reviewed  CBC WITH DIFFERENTIAL - Abnormal; Notable for the following:    WBC 10.9 (*)    Neutrophils Relative % 93 (*)    Neutro Abs 10.1 (*)    Lymphocytes Relative 2 (*)    Lymphs Abs 0.2 (*)    Monocytes Relative 1 (*)    All other components within normal limits  BASIC METABOLIC PANEL - Abnormal; Notable for the following:    Glucose, Bld 161 (*)    BUN 29 (*)    Calcium 12.3 (*)    GFR calc non Af Amer 51 (*)    GFR calc Af Amer 59 (*)    All other components within normal limits  LACTIC ACID, PLASMA - Abnormal; Notable for the following:    Lactic Acid, Venous 2.8 (*)    All other components within normal limits  CULTURE, BLOOD (ROUTINE X 2)  CULTURE, BLOOD (ROUTINE X 2)  TROPONIN I  URINALYSIS, ROUTINE W REFLEX MICROSCOPIC   Imaging Review Ct Angio Chest W/cm &/or Wo Cm  03/19/2013   CLINICAL DATA:  Chest and abdominal pain. Weakness.  EXAM: CT ANGIOGRAPHY CHEST  CT ABDOMEN AND PELVIS WITH CONTRAST  TECHNIQUE: Multidetector CT imaging of the chest was performed using the standard protocol during bolus administration of intravenous contrast. Multiplanar CT image reconstructions including MIPs were obtained to evaluate the vascular anatomy.  Multidetector CT imaging of the abdomen and pelvis was performed using the standard protocol during bolus administration of intravenous contrast.  CONTRAST:  OMNIPAQUE IOHEXOL 350 MG/ML SOLN  COMPARISON:  None.  CTA chest 11/10/2012. CT abdomen and pelvis with contrast 04/19/2012.  FINDINGS: CTA CHEST FINDINGS  Pulmonary arterial opacification is excellent. No focal filling defects are evident to suggest pulmonary emboli. The heart is enlarged. There is borderline enlargement of the ascending thoracic aorta without significant change. Atherosclerotic calcifications are present  throughout the thoracic aorta. Small bilateral pleural effusions are now present. Coronary artery calcifications are evident. There is no significant pericardial effusion. Rightward curvature of the lower thoracic spine is noted. Scarring in the lingula is stable. Mild of 7 is changes are present. There is mild atelectasis associated with a small effusions. And did the bone windows demonstrate exaggerated thoracic kyphosis add in addition to the scoliosis. No focal lytic or blastic lesions are present. An 18 mm right thyroid nodule is again noted.  Review of the MIP images confirms the above findings.  CT ABDOMEN and PELVIS FINDINGS  Multiple hepatic cysts are stable. Mild intra and extra hepatic biliary dilation is similar to the prior studies. No obstructing lesion is evident. Pancreatic duct is dilated as well. The spleen is within normal limits. Did the stomach and duodenum are unremarkable. The adrenal glands are normal bilaterally. Scarring in both kidneys is stable. Extensive atherosclerotic calcifications are present in the aorta without aneurysm or change.  Diffuse diverticular changes are evident throughout the sigmoid colon without focal inflammation to suggest diverticulitis. The remainder the colon is unremarkable. And a coiled catheter is present within the urinary bladder. No significant free fluid or adenopathy is  present.  The bones are osteopenic. Degenerative changes are present in the lower lumbar spine period leftward curvature is centered at L3.  Review of the MIP images confirms the above findings.  IMPRESSION: CTA CHEST IMPRESSION  1. Did no evidence for pulmonary embolus period 2. 18 mm right thyroid nodule. Ultrasound may be useful for further evaluation as clinically indicated. 3. New small bilateral pleural effusions and associated atelectasis. 4. Stable emphysema. 5. Stable cardiomegaly without failure. 6. Coronary artery disease. 7. 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  CT ABDOMEN and PELVIS IMPRESSION  1. Stable intra and extra hepatic biliary dilation as well as pancreatic duct dilation. No obstructing lesion is evident. 2. Diffuse colonic diverticulosis without evidence for diverticulitis. 3. Scoliosis and advanced degenerative changes within the lower lumbar spine. 4. Stable hepatic cysts.   Electronically Signed   By: Gennette Pac   On: 03/19/2013 16:17   Ct Abdomen Pelvis W Contrast  03/19/2013   CLINICAL DATA:  Chest and abdominal pain. Weakness.  EXAM: CT ANGIOGRAPHY CHEST  CT ABDOMEN AND PELVIS WITH CONTRAST  TECHNIQUE: Multidetector CT imaging of the chest was performed using the standard protocol during bolus administration of intravenous contrast. Multiplanar CT image reconstructions including MIPs were obtained to evaluate the vascular anatomy. Multidetector CT imaging of the abdomen and pelvis was performed using the standard protocol during bolus administration of intravenous contrast.  CONTRAST:  OMNIPAQUE IOHEXOL 350 MG/ML SOLN  COMPARISON:  None.  CTA chest 11/10/2012. CT abdomen and pelvis with contrast 04/19/2012.  FINDINGS: CTA CHEST FINDINGS  Pulmonary arterial opacification is excellent. No focal filling defects are evident to suggest pulmonary emboli. The heart is enlarged. There is borderline enlargement of the ascending thoracic aorta without  significant change. Atherosclerotic calcifications are present throughout the thoracic aorta. Small bilateral pleural effusions are now present. Coronary artery calcifications are evident. There is no significant pericardial effusion. Rightward curvature of the lower thoracic spine is noted. Scarring in the lingula is stable. Mild of 7 is changes are present. There is mild atelectasis associated with a small effusions. And did the bone windows demonstrate exaggerated thoracic kyphosis add in addition to the scoliosis. No focal lytic or blastic lesions are present. An 18 mm right thyroid nodule is again noted.  Review of the MIP images confirms the above findings.  CT ABDOMEN and PELVIS FINDINGS  Multiple hepatic cysts are stable. Mild intra and extra hepatic biliary dilation is similar to the prior studies. No obstructing lesion is evident. Pancreatic duct is dilated as well. The spleen is within normal limits. Did the stomach and duodenum are unremarkable. The adrenal glands are normal bilaterally. Scarring in both kidneys is stable. Extensive atherosclerotic calcifications are present in the aorta without aneurysm or change.  Diffuse diverticular changes are evident throughout the sigmoid colon without focal inflammation to suggest diverticulitis. The remainder the colon is unremarkable. And a coiled catheter is present within the urinary bladder. No significant free fluid or adenopathy is present.  The bones are osteopenic. Degenerative changes are present in the lower lumbar spine period leftward curvature is centered at L3.  Review of the MIP images confirms the above findings.  IMPRESSION: CTA CHEST IMPRESSION  1. Did no evidence for pulmonary embolus period 2. 18 mm right thyroid nodule. Ultrasound may be useful for further evaluation as clinically indicated. 3. New small bilateral pleural effusions and associated atelectasis. 4. Stable emphysema. 5. Stable cardiomegaly without failure. 6. Coronary artery  disease. 7. 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  CT ABDOMEN and PELVIS IMPRESSION  1. Stable intra and extra hepatic biliary dilation as well as pancreatic duct dilation. No obstructing lesion is evident. 2. Diffuse colonic diverticulosis without evidence for diverticulitis. 3. Scoliosis and advanced degenerative changes within the lower lumbar spine. 4. Stable hepatic cysts.   Electronically Signed   By: Gennette Pac   On: 03/19/2013 16:17   Dg Chest Portable 1 View  03/19/2013   CLINICAL DATA:  Weakness, shortness of Breath  EXAM: PORTABLE CHEST - 1 VIEW  COMPARISON:  02/08/2013  FINDINGS: The heart size and mediastinal contours are within normal limits. No acute infiltrate or pulmonary edema. Stable bilateral perihilar linear atelectasis or scarring. The visualized skeletal structures are unremarkable.  IMPRESSION: No active disease. No significant change.   Electronically Signed   By: Natasha Mead   On: 03/19/2013 14:14   EKG:  Rhythm: afib with RVR Vent. rate 109 BPM PR interval * ms QRS duration 82 ms QT/QTc 324/436 ms ST segments: NS ST changes   MDM   1. New onset a-fib   2. UTI (urinary tract infection)   3. Dehydration   4. Hypercalcemia  79 year old female with multiple complaints. Recently diagnosed with a urinary tract infection and cultures subsequently came back resistant to nitrofurantoin. Son reports that she did not start taking Bactrim yet. Cultures reviewed.E. Coli and Morganella morganii both sensitive to rocephin. Patient does have mild diffuse tenderness on exam. This may be related to UTI, but given age and change in symptoms will image. I have some concern for pt possible being septic. Will replace foley. She appears somewhat ill. BP has been on softer side with hx of HTN and per review of records typically runs higher. She is in A. fib with a rapid ventricular response. Her rate has been pretty consistently right around 100 though and I do  not think that her hypotension is secondary to this. Son reports decreased urinary output over past couple days. BUN elevated and has dry mucus membranes/skin tenting.  IVF. Will check lactic acid/blood cultures.   Additionally,  new onset afib. Chest pain and pretty marked tachypnea. Will CT to eval for PE or occult pneumonia. Chest pain atypical. Initial troponin normal.  CT as above. No particularly concerning findings. BP remains stable. Remains in afib with improved rate. Will discuss with medicine for admission.   Raeford Razor, MD 03/19/13 [id number]

## 2013-03-19 NOTE — H&P (Signed)
[facility] History and Physical  Madison Hickman:[id number] DOB: [birth year] DOA: 03/19/2013  Referring physician: Dr Juleen China PCP: Milinda Antis, MD   Chief Complaint:  Left shoulder and chest pain since 1 day Abdominal pain since one day  HPI:  79 year old female with a history of hypertension, iron deficiency anemia, history of meningioma with chronic headaches, bladder prolapse, recent UTI, GERD who was brought to the ED by her son after she complained of pain over her left shoulder radiating down to her chest and her  fingers being numb since last night. Patient reports chronic pain in her left shoulder but became worse yesterday. She also reports dull epigastric pain since yesterday so sedated with nausea but no vomiting. Denies any bowel or urinary symptoms. Patient has been on Foley for past 10 days due to bladder prolapse and has been seeing a urologist. Patient denies any fever, chills, blurred vision, dizziness, palpitations, shortness of breath, orthopnea or PND. Son reports that she has been progressively been tired for past few months.  Patient also has meningioma of the brain for which she is not a surgical candidate. Patient recently had UTI with Escherichia coli and Morganella both of which were sensitive to Rocephin. Patient was previously on nitrofurantoin and was called for change in antibiotics but has not started yet  In the ED patient was noted to have mild leukocytosis with elevated calcium level. She was also noted to have a new onset A. fib with heart rate in 100s . A CT angiogram of the chest was done as she was saturating to 80s on room at and was negative for PE. Given her abdominal pain, lactic acid level was checked which was mildly elevated. A CT scan of the abdomen and pelvis done which was unremarkable for any obstruction although it showed dilated CBD and pancreatic duct no stones or obstruction were visible. LFTs and lipase were normal. Initial troponin was  negative. I had hospitalist called for admission to telemetry.  Review of Systems:  Constitutional: Denies fever, chills, diaphoresis, reports appetite change and fatigue.  HEENT: Denies photophobia, eye pain, redness, hearing loss, ear pain, congestion, sore throat, rhinorrhea, sneezing, mouth sores, trouble swallowing, neck pain, neck stiffness and tinnitus.   Respiratory: Dyspnea on exertion, Denies SOB, cough, chest tightness,  and wheezing.   Cardiovascular:  chest pain, denies palpitations and leg swelling.  Gastrointestinal:  nausea, abdominal pain , denies vomiting, diarrhea, constipation, blood in stool and abdominal distention.  Genitourinary: Denies dysuria, urgency, frequency, hematuria, flank pain , difficulty urinating due to bladder prolapse Endocrine: Denies polyuria, polydipsia. Musculoskeletal: Denies myalgias, back pain, joint swelling, arthralgias and gait problem.   Neurological: Denies dizziness, seizures, syncope, reports weakness and headache, denies light-headedness, numbness     Past Medical History  Diagnosis Date  . Back pain, chronic   . Headache(784.0)   . Hypertension   . Arthritis   . Meningioma 04/15/2012  . IDA (iron deficiency anemia)   . Diastolic dysfunction Oct 2013    EF 50-55%   . Hypercalcemia   . Bladder prolapse, female, acquired    Past Surgical History  Procedure Laterality Date  . Abdominal hysterectomy    . Arm surgery    . Esophagogastroduodenoscopy  04/18/2012    NFA:OZHYQ hiatal hernia/NO OBVIOUS SOURCE FOR PROFOUND ANEMIA IDENTIFIED.MODERATE GASTRITIS CONTRIBUTES TO ANEMIA/no definite stricture identified, empiric dilation performed. + H.PYLORI   Social History:  reports that she has never smoked. She does not have any smokeless tobacco  history on file. She reports that she does not drink alcohol or use illicit drugs.  No Known Allergies  No family history on file.  Prior to Admission medications   Medication Sig Start Date  End Date Taking? Authorizing Provider  amLODipine (NORVASC) 5 MG tablet Take 1 tablet (5 mg total) by mouth daily. For blood pressure 01/27/13  Yes Salley Scarlet, MD  calcium carbonate (TUMS - DOSED IN MG ELEMENTAL CALCIUM) 500 MG chewable tablet Chew 1 tablet by mouth daily as needed for heartburn.   Yes Historical Provider, MD  docusate sodium (COLACE) 100 MG capsule Take 100 mg by mouth daily as needed for constipation. 08/17/12  Yes Christiane Ha, MD  enalapril (VASOTEC) 10 MG tablet Take 1 tablet (10 mg total) by mouth 2 (two) times daily. 01/19/13  Yes Salley Scarlet, MD  HYDROcodone-acetaminophen (NORCO) 7.5-325 MG per tablet Take 1 tablet by mouth every 6 (six) hours as needed for pain.   Yes Historical Provider, MD  ibuprofen (ADVIL,MOTRIN) 200 MG tablet Take 800 mg by mouth daily as needed for pain.   Yes Historical Provider, MD  nitrofurantoin, macrocrystal-monohydrate, (MACROBID) 100 MG capsule Take 1 capsule (100 mg total) by mouth 2 (two) times daily. X 7 days 03/12/13  Yes Donnetta Hutching, MD  pantoprazole (PROTONIX) 40 MG tablet Take 1 tablet (40 mg total) by mouth daily. 30 minutes before breakfast for GERD 11/17/12 11/17/13 Yes Salley Scarlet, MD  cephALEXin (KEFLEX) 500 MG capsule Take 1 capsule (500 mg total) by mouth 3 (three) times daily. 02/28/13   Ward Givens, MD    Physical Exam:  Filed Vitals:   03/19/13 1500 03/19/13 1600 03/19/13 1611 03/19/13 1720  BP: 106/71 126/70  128/84  Pulse: 93 87  83  Temp:   97.9 F (36.6 C) 98.2 F (36.8 C)  TempSrc:   Oral Oral  Resp: 31 27  24   Height:    5\' 2"  (1.575 m)  Weight:    44.453 kg (98 lb)  SpO2: 95% 91%  90%    Constitutional: Vital signs reviewed.  Patient is a cachectic female lying in bed in no acute distress HEENT: No pallor , dry oral mucosa,  Cardiovascular: S1 and S2 is irregularly irregular, no murmurs rub or gallop Pulmonary/Chest: CTAB, no wheezes, rales, or rhonchi Abdominal: Soft. Non-tender,  non-distended, bowel sounds are normal, no masses, organomegaly, or guarding present.  Foley in place Musculoskeletal: No joint deformities, erythema, or stiffness, ROM full and no nontender Ext: no edema and no cyanosis,  Neurological: A&O x3, nonfocal  Labs on Admission:  Basic Metabolic Panel:  Recent Labs Lab 03/19/13 1339  NA 136  K 3.7  CL 103  CO2 21  GLUCOSE 161*  BUN 29*  CREATININE 0.97  CALCIUM 12.3*   Liver Function Tests:  Recent Labs Lab 03/19/13 1618  AST 36  ALT 16  ALKPHOS 64  BILITOT 0.3  PROT 6.6  ALBUMIN 3.2*    Recent Labs Lab 03/19/13 1618  LIPASE 27   No results found for this basename: AMMONIA,  in the last 168 hours CBC:  Recent Labs Lab 03/19/13 1339  WBC 10.9*  NEUTROABS 10.1*  HGB 13.3  HCT 41.5  MCV 89.6  PLT 234   Cardiac Enzymes:  Recent Labs Lab 03/19/13 1339  TROPONINI <0.30   BNP: No components found with this basename: POCBNP,  CBG: No results found for this basename: GLUCAP,  in the last 168 hours  Radiological  Exams on Admission: Ct Angio Chest W/cm &/or Wo Cm  03/19/2013   CLINICAL DATA:  Chest and abdominal pain. Weakness.  EXAM: CT ANGIOGRAPHY CHEST  CT ABDOMEN AND PELVIS WITH CONTRAST  TECHNIQUE: Multidetector CT imaging of the chest was performed using the standard protocol during bolus administration of intravenous contrast. Multiplanar CT image reconstructions including MIPs were obtained to evaluate the vascular anatomy. Multidetector CT imaging of the abdomen and pelvis was performed using the standard protocol during bolus administration of intravenous contrast.  CONTRAST:  OMNIPAQUE IOHEXOL 350 MG/ML SOLN  COMPARISON:  None.  CTA chest 11/10/2012. CT abdomen and pelvis with contrast 04/19/2012.  FINDINGS: CTA CHEST FINDINGS  Pulmonary arterial opacification is excellent. No focal filling defects are evident to suggest pulmonary emboli. The heart is enlarged. There is borderline enlargement of the  ascending thoracic aorta without significant change. Atherosclerotic calcifications are present throughout the thoracic aorta. Small bilateral pleural effusions are now present. Coronary artery calcifications are evident. There is no significant pericardial effusion. Rightward curvature of the lower thoracic spine is noted. Scarring in the lingula is stable. Mild of 7 is changes are present. There is mild atelectasis associated with a small effusions. And did the bone windows demonstrate exaggerated thoracic kyphosis add in addition to the scoliosis. No focal lytic or blastic lesions are present. An 18 mm right thyroid nodule is again noted.  Review of the MIP images confirms the above findings.  CT ABDOMEN and PELVIS FINDINGS  Multiple hepatic cysts are stable. Mild intra and extra hepatic biliary dilation is similar to the prior studies. No obstructing lesion is evident. Pancreatic duct is dilated as well. The spleen is within normal limits. Did the stomach and duodenum are unremarkable. The adrenal glands are normal bilaterally. Scarring in both kidneys is stable. Extensive atherosclerotic calcifications are present in the aorta without aneurysm or change.  Diffuse diverticular changes are evident throughout the sigmoid colon without focal inflammation to suggest diverticulitis. The remainder the colon is unremarkable. And a coiled catheter is present within the urinary bladder. No significant free fluid or adenopathy is present.  The bones are osteopenic. Degenerative changes are present in the lower lumbar spine period leftward curvature is centered at L3.  Review of the MIP images confirms the above findings.  IMPRESSION: CTA CHEST IMPRESSION  1. Did no evidence for pulmonary embolus period 2. 18 mm right thyroid nodule. Ultrasound may be useful for further evaluation as clinically indicated. 3. New small bilateral pleural effusions and associated atelectasis. 4. Stable emphysema. 5. Stable cardiomegaly  without failure. 6. Coronary artery disease. 7. 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  CT ABDOMEN and PELVIS IMPRESSION  1. Stable intra and extra hepatic biliary dilation as well as pancreatic duct dilation. No obstructing lesion is evident. 2. Diffuse colonic diverticulosis without evidence for diverticulitis. 3. Scoliosis and advanced degenerative changes within the lower lumbar spine. 4. Stable hepatic cysts.   Electronically Signed   By: Gennette Pac   On: 03/19/2013 16:17   Ct Abdomen Pelvis W Contrast  03/19/2013   CLINICAL DATA:  Chest and abdominal pain. Weakness.  EXAM: CT ANGIOGRAPHY CHEST  CT ABDOMEN AND PELVIS WITH CONTRAST  TECHNIQUE: Multidetector CT imaging of the chest was performed using the standard protocol during bolus administration of intravenous contrast. Multiplanar CT image reconstructions including MIPs were obtained to evaluate the vascular anatomy. Multidetector CT imaging of the abdomen and pelvis was performed using the standard protocol during bolus administration of  intravenous contrast.  CONTRAST:  OMNIPAQUE IOHEXOL 350 MG/ML SOLN  COMPARISON:  None.  CTA chest 11/10/2012. CT abdomen and pelvis with contrast 04/19/2012.  FINDINGS: CTA CHEST FINDINGS  Pulmonary arterial opacification is excellent. No focal filling defects are evident to suggest pulmonary emboli. The heart is enlarged. There is borderline enlargement of the ascending thoracic aorta without significant change. Atherosclerotic calcifications are present throughout the thoracic aorta. Small bilateral pleural effusions are now present. Coronary artery calcifications are evident. There is no significant pericardial effusion. Rightward curvature of the lower thoracic spine is noted. Scarring in the lingula is stable. Mild of 7 is changes are present. There is mild atelectasis associated with a small effusions. And did the bone windows demonstrate exaggerated thoracic kyphosis add in  addition to the scoliosis. No focal lytic or blastic lesions are present. An 18 mm right thyroid nodule is again noted.  Review of the MIP images confirms the above findings.  CT ABDOMEN and PELVIS FINDINGS  Multiple hepatic cysts are stable. Mild intra and extra hepatic biliary dilation is similar to the prior studies. No obstructing lesion is evident. Pancreatic duct is dilated as well. The spleen is within normal limits. Did the stomach and duodenum are unremarkable. The adrenal glands are normal bilaterally. Scarring in both kidneys is stable. Extensive atherosclerotic calcifications are present in the aorta without aneurysm or change.  Diffuse diverticular changes are evident throughout the sigmoid colon without focal inflammation to suggest diverticulitis. The remainder the colon is unremarkable. And a coiled catheter is present within the urinary bladder. No significant free fluid or adenopathy is present.  The bones are osteopenic. Degenerative changes are present in the lower lumbar spine period leftward curvature is centered at L3.  Review of the MIP images confirms the above findings.  IMPRESSION: CTA CHEST IMPRESSION  1. Did no evidence for pulmonary embolus period 2. 18 mm right thyroid nodule. Ultrasound may be useful for further evaluation as clinically indicated. 3. New small bilateral pleural effusions and associated atelectasis. 4. Stable emphysema. 5. Stable cardiomegaly without failure. 6. Coronary artery disease. 7. 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  CT ABDOMEN and PELVIS IMPRESSION  1. Stable intra and extra hepatic biliary dilation as well as pancreatic duct dilation. No obstructing lesion is evident. 2. Diffuse colonic diverticulosis without evidence for diverticulitis. 3. Scoliosis and advanced degenerative changes within the lower lumbar spine. 4. Stable hepatic cysts.   Electronically Signed   By: Gennette Pac   On: 03/19/2013 16:17   Dg Chest Portable 1  View  03/19/2013   CLINICAL DATA:  Weakness, shortness of Breath  EXAM: PORTABLE CHEST - 1 VIEW  COMPARISON:  02/08/2013  FINDINGS: The heart size and mediastinal contours are within normal limits. No acute infiltrate or pulmonary edema. Stable bilateral perihilar linear atelectasis or scarring. The visualized skeletal structures are unremarkable.  IMPRESSION: No active disease. No significant change.   Electronically Signed   By: Natasha Mead   On: 03/19/2013 14:14    EKG: A. fib with RVR, no ST-T changes  Assessment/Plan Active Problems:   Chest pain Appears more related to left shoulder pain that is radiating. Monitor in telemetry. We will rule out ACS with serial cardiac enzymes. We'll place her on aspirin and sublingual nitroglycerin. 2-D echo ordered. We will check x-ray of her left shoulder.  Abdominal pain  No clear etiology. Abdominal CT was unremarkable except for BAB and pancreatic dilatation without any obstruction. Lipase and LFTs have  been normal. Mildly elevated lactic acid. Also has hypocalcemia. Continue pain control and hydration. I will order PPI.  A. fib with RVR This is new onset. No clear etiology. Will check TSH. Patient has history of significant bradycardia on metoprolol so I would not to use a rate limiting lesions. Currently rate controlled. If RVR recurs will try digoxin.    Chronic diastolic congestive heart failure Appears mildly hypovolemic at this time. I would place her on gentle hydration.       Meningioma Not surgical candidate. As per previous note she informed that she was not a hospice candidate as well.  UTI Recent urine culture growing Escherichia coli and Morganella awaits are both sensitive glucose again and I would place her on that.   Deconditioning and malnutrition We'll consult PT and nutrition  DVT prophylaxis: Sq lovenox  Code Status: DO NOT RESUSCITATE Family Communication: Son at bedside Disposition Plan:  inpatient  Eddie North [facility] Pager [phone]  If 7PM-7AM, please contact night-coverage [email or web address] Password [facility] 03/19/2013, 6:09 PM   Total time spent: 70 minutes

## 2013-03-19 NOTE — Telephone Encounter (Signed)
Attempt to contact patient by phone regarding + urine culture. Patient noted to be at [facility] ED. Called and spoke with attending MD Dr Juleen China who is aware of + urine culture.

## 2013-03-19 NOTE — ED Notes (Signed)
EDP in with pt 

## 2013-03-19 NOTE — ED Notes (Signed)
Pt states abdominal pain which began last night. Family states she was c/o tingling to the hands also. Pt also states pain to chest and left shoulder area. Decreased appetite. Nonproductive cough. Nausea.

## 2013-03-20 DIAGNOSIS — I059 Rheumatic mitral valve disease, unspecified: Secondary | ICD-10-CM

## 2013-03-20 DIAGNOSIS — E860 Dehydration: Secondary | ICD-10-CM

## 2013-03-20 LAB — BASIC METABOLIC PANEL
BUN: 32 mg/dL — ABNORMAL HIGH (ref 6–23)
Calcium: 10.3 mg/dL (ref 8.4–10.5)
GFR calc Af Amer: 58 mL/min — ABNORMAL LOW (ref >90)
GFR calc non Af Amer: 50 mL/min — ABNORMAL LOW (ref >90)
Glucose, Bld: 92 mg/dL (ref 70–99)
Sodium: 137 mEq/L (ref 135–145)

## 2013-03-20 LAB — CBC
MCH: 28.8 pg (ref 26.0–34.0)
MCHC: 31.9 g/dL (ref 30.0–36.0)
Platelets: 209 10*3/uL (ref 150–400)
RBC: 3.96 MIL/uL (ref 3.87–5.11)

## 2013-03-20 MED ORDER — ALUM & MAG HYDROXIDE-SIMETH 200-200-20 MG/5ML PO SUSP
15.0000 mL | Freq: Four times a day (QID) | ORAL | Status: DC | PRN
  Administered 2013-03-20: 15 mL via ORAL
  Filled 2013-03-20: qty 30

## 2013-03-20 MED ORDER — ENOXAPARIN SODIUM 30 MG/0.3ML ~~LOC~~ SOLN
30.0000 mg | SUBCUTANEOUS | Status: DC
  Administered 2013-03-20 – 2013-03-21 (×2): 30 mg via SUBCUTANEOUS
  Filled 2013-03-20 (×2): qty 0.3

## 2013-03-20 MED ORDER — ENSURE COMPLETE PO LIQD
237.0000 mL | Freq: Two times a day (BID) | ORAL | Status: DC
  Administered 2013-03-20 – 2013-03-22 (×4): 237 mL via ORAL

## 2013-03-20 NOTE — Evaluation (Addendum)
Physical Therapy Evaluation Patient Details Name: Madison Hickman MRN: [id number] DOB: 16-Nov-1923 Today's Date: 03/20/2013 Time: 1125-1200 PT Time Calculation (min): 35 min  PT Assessment / Plan / Recommendation History of Present Illness  Pt is admitted with left shoulder pain which radiates to the LUE.  She has a hx of multiple medical problems including a meningioma which is non-operable.  Pt staes that she now lives alone with her son living next door...she also states that she just recently left "the nursing home"...it is unclear if this information is correct  Clinical Impression   Pt is seen for evaluation.  She is alert and very cooperative.  She appears malnourished with poor muscle bulk but on MMT she demonstrated good strength on MMT.  Her stability is poor and she now needs a walker for gait due to generalized deconditioning. On evaluation of the LUE, she had full active ROM and no pain.  Her O2 sat on RA after gait =94%  In my opinion, she should not be living alone and should have full time assistance at home.  She would benefit from HHPT at d/c.    PT Assessment  Patient needs continued PT services    Follow Up Recommendations  Home health PT    Does the patient have the potential to tolerate intense rehabilitation    no  Barriers to Discharge Decreased caregiver support      Equipment Recommendations  None recommended by PT    Recommendations for Other Services     Frequency Min 3X/week    Precautions / Restrictions Precautions Precautions: Fall Restrictions Weight Bearing Restrictions: No   Pertinent Vitals/Pain       Mobility  Bed Mobility Bed Mobility: Supine to Sit;Sit to Supine Supine to Sit: 5: Supervision;HOB elevated Transfers Transfers: Sit to Stand;Stand to Sit Sit to Stand: 6: Modified independent (Device/Increase time);From bed Stand to Sit: 6: Modified independent (Device/Increase time);To chair/3-in-1 Ambulation/Gait Ambulation/Gait  Assistance: 4: Min guard Ambulation Distance (Feet): 100 Feet Assistive device: Rolling walker Ambulation/Gait Assistance Details: pt states that she normally uses a cane for gait but is currently unsteady with a cane...unable to take more than 2-3 steps without significant stabilization Gait Pattern: Narrow base of support;Trunk flexed;Left flexed knee in stance Gait velocity: WNL General Gait Details: Pt has 2 episodes of min LOB but was able to self correct Stairs: No Wheelchair Mobility Wheelchair Mobility: No    Exercises     PT Diagnosis: Difficulty walking;Generalized weakness  PT Problem List: Decreased activity tolerance;Decreased balance;Decreased mobility PT Treatment Interventions: Gait training;Functional mobility training;Therapeutic exercise;Patient/family education     PT Goals(Current goals can be found in the care plan section) Acute Rehab PT Goals PT Goal Formulation: With patient Time For Goal Achievement: 04/03/13 Potential to Achieve Goals: Fair  Visit Information  Last PT Received On: 03/20/13 History of Present Illness: Pt is admitted with left shoulder pain which radiates to the LUE.  She has a hx of multiple medical problems including a meningioma which is non-operable.  Pt staes that she now lives alone with her son living next door...she also states that she just recently left "the nursing home"...it is unclear if this information is correct       Prior Functioning  Home Living Living Arrangements: Alone Prior Function Level of Independence: Needs assistance Gait / Transfers Assistance Needed: ambulates independently with cane or walker ADL's / Homemaking Assistance Needed: needs assistance to get food but states that she cooks for herself...it is unclear if  she is able to bathe herself Communication Communication: No difficulties    Cognition  Cognition Arousal/Alertness: Awake/alert Behavior During Therapy: WFL for tasks  assessed/performed Overall Cognitive Status: Within Functional Limits for tasks assessed    Extremity/Trunk Assessment Lower Extremity Assessment Lower Extremity Assessment: Generalized weakness Cervical / Trunk Assessment Cervical / Trunk Assessment: Kyphotic   Balance Balance Balance Assessed: Yes Static Sitting Balance Static Sitting - Balance Support: Feet supported;No upper extremity supported Static Sitting - Level of Assistance: 7: Independent Static Standing Balance Static Standing - Balance Support: No upper extremity supported Static Standing - Level of Assistance: 5: Stand by assistance  End of Session PT - End of Session Equipment Utilized During Treatment: Gait belt Activity Tolerance: Patient tolerated treatment well Patient left: in chair;with call bell/phone within reach;with chair alarm set  GP     Konrad Penta 03/20/2013, 12:19 PM

## 2013-03-20 NOTE — Progress Notes (Signed)
*  PRELIMINARY RESULTS* Echocardiogram 2D Echocardiogram has been performed.  Madison Hickman 03/20/2013, 10:35 AM

## 2013-03-20 NOTE — Progress Notes (Signed)
INITIAL NUTRITION ASSESSMENT  -Severe Malnutrition in the context of chronic illness; ongoing  -Underweight   INTERVENTION: Ensure Complete po BID, each supplement provides 350 kcal and 13 grams of protein.  NUTRITION DIAGNOSIS: Malnutrition (severe); ongoing related to inadequate oral intake as evidenced by dehydration on admission and hx of FTT, brain tumor .   Goal: Pt to meet >/= 90% of their estimated nutrition needs   Monitor:  Po intake, labs and wt trends  Reason for Assessment: Malnutrition Screen Score= 2  79 y.o. female  ASSESSMENT:  She presented to ED with abdominal pain and dehydrated.  Pt last assessed by RD on 08/15/12. Her wt is stable compared to 7 months ago. However, today she describes her appetite as "not to good" possibly related to recent UTI. She lives beside her son who she says assist her with groceries and laundry. Pt is feeding herself lunch during my visit and is agreeable to receive Ensure between meals to increase nutrition intake. Coupons are available to help her with the cost if she continue to consume them after discharge.  Inadequate oral intake per pt diet hx and she continues to meet criteria for malnutrition.   Patient Active Problem List   Diagnosis Date Noted  . Dehydration 03/19/2013  . New onset a-fib 03/19/2013  . Chest pain 03/19/2013  . Abdominal  pain, other specified site 03/19/2013  . UTI (urinary tract infection) 01/29/2013  . Rash and nonspecific skin eruption 01/29/2013  . Prolapse of female bladder, acquired 01/29/2013  . Chronic reactive Hypertension 11/04/2012  . Chronic Reactive Bradycardia 11/04/2012  . Dyspepsia 11/04/2012  . Essential hypertension, benign 11/02/2012  . Dysphagia, unspecified 09/16/2012  . Adult failure to thrive 08/14/2012  . Vasogenic cerebral edema 08/14/2012  . Hypercalcemia 08/14/2012  . Malnutrition 06/22/2012  . Peripheral edema 06/21/2012  . Iron deficiency 04/16/2012  . B12 deficiency  04/16/2012  . Dysphagia 04/16/2012  . Chronic diastolic congestive heart failure 04/15/2012  . Microcytic anemia 04/15/2012  . Chronic back pain 04/15/2012  . Chronic headache 04/15/2012  . Meningioma 04/15/2012   Height: Ht Readings from Last 1 Encounters:  03/19/13 5\' 2"  (1.575 m)    Weight: Wt Readings from Last 1 Encounters:  03/19/13 98 lb (44.453 kg)    Ideal Body Weight: 110# (50kg)  % Ideal Body Weight: 89%  Wt Readings from Last 10 Encounters:  03/19/13 98 lb (44.453 kg)  03/16/13 100 lb (45.36 kg)  03/12/13 95 lb (43.092 kg)  02/28/13 100 lb (45.36 kg)  02/08/13 104 lb (47.174 kg)  01/27/13 104 lb (47.174 kg)  11/07/12 98 lb 0.6 oz (44.471 kg)  11/04/12 94 lb 5.7 oz (42.8 kg)  10/31/12 97 lb (43.999 kg)  09/14/12 90 lb 6.4 oz (41.005 kg)    Usual Body Weight: 95-100#  % Usual Body Weight: 100%  BMI:  Body mass index is 17.92 kg/(m^2).underweight  Estimated Nutritional Needs: Kcal: 1400-1600 Protein: 57-66 gr Fluid: per MD goals  Skin: No issues noted  Diet Order: Cardiac  EDUCATION NEEDS: -Education needs addressed   Intake/Output Summary (Last 24 hours) at 03/20/13 0955 Last data filed at 03/20/13 0500  Gross per 24 hour  Intake 901.25 ml  Output    600 ml  Net 301.25 ml    Last BM: 03/18/13   Labs:   Recent Labs Lab 03/19/13 1339 03/20/13 0453  NA 136 137  K 3.7 3.7  CL 103 106  CO2 21 23  BUN 29* 32*  CREATININE  0.97 0.98  CALCIUM 12.3* 10.3  GLUCOSE 161* 92    CBG (last 3)  No results found for this basename: GLUCAP,  in the last 72 hours  Scheduled Meds: . amLODipine  5 mg Oral Daily  . aspirin  81 mg Oral Daily  . cefTRIAXone (ROCEPHIN)  IV  1 g Intravenous Q24H  . enalapril  10 mg Oral BID  . enoxaparin (LOVENOX) injection  40 mg Subcutaneous Q24H  . pantoprazole  40 mg Oral Daily  . sodium chloride  3 mL Intravenous Q12H    Continuous Infusions: . sodium chloride 75 mL/hr at 03/20/13 0500    Past  Medical History  Diagnosis Date  . Back pain, chronic   . Headache(784.0)   . Hypertension   . Arthritis   . Meningioma 04/15/2012  . IDA (iron deficiency anemia)   . Diastolic dysfunction Oct 2013    EF 50-55%   . Hypercalcemia   . Bladder prolapse, female, acquired     Past Surgical History  Procedure Laterality Date  . Abdominal hysterectomy    . Arm surgery    . Esophagogastroduodenoscopy  04/18/2012    ZOX:WRUEA hiatal hernia/NO OBVIOUS SOURCE FOR PROFOUND ANEMIA IDENTIFIED.MODERATE GASTRITIS CONTRIBUTES TO ANEMIA/no definite stricture identified, empiric dilation performed. + H.PYLORI    Royann Shivers MS,RD,LDN,CSG Office: [phone] Pager: [phone]

## 2013-03-20 NOTE — Progress Notes (Signed)
Utilization Review Complete  

## 2013-03-20 NOTE — Progress Notes (Addendum)
[facility] PROGRESS NOTE  Madison Hickman ZOX:[id number] DOB: 08/27/1923 DOA: 03/19/2013 PCP: Milinda Antis, MD  Brief narrative 79 year old female with a history of hypertension, iron deficiency anemia, history of meningioma with chronic headaches, bladder prolapse, recent UTI, GERD who was brought to the ED by her son after she complained of pain over her left shoulder radiating down to her chest and her fingers being numb since previous night.  She also reported dull epigastric pain since the previous day associated with with nausea but no vomiting. Patient recently had UTI with Escherichia coli and Morganella both of which were sensitive to Rocephin. Patient was previously on nitrofurantoin and was called for change in antibiotics but has not started yet  In the ED patient was noted to have mild leukocytosis with elevated calcium level. She was also noted to have a new onset A. fib with heart rate in 100s . A CT angiogram of the chest was done as she was saturating to 80s on room at and was negative for PE. Given her abdominal pain, lactic acid level was checked which was mildly elevated. A CT scan of the abdomen and pelvis done which was unremarkable for any obstruction although it showed dilated CBD and pancreatic duct no stones or obstruction were visible. LFTs and lipase were normal. Initial troponin was negative. I  Assessment/Plan:  Chest pain  Appears more related to left shoulder pain that is radiating. Monitor in telemetry. Serial troponins negative.  Continue  aspirin and sublingual nitroglycerin. 2-D echo ordered.arthritic changes  x-ray of her left shoulder.  Abdominal pain  No clear etiology. Abdominal CT was unremarkable except for BAB and pancreatic dilatation without any obstruction. Lipase and LFTs have been normal. Mildly elevated lactic acid on admission which has normalized. Also has hypocalcemia. Continue pain control and hydration. Continue PPI.   A. fib with RVR   This is new onset. No clear etiology. Will check TSH and 2d ECHO. Patient has history of significant bradycardia on metoprolol so I have not used  a rate limiting lesions.  Currently rate controlled. If RVR recurs will try digoxin.   Chronic diastolic congestive heart failure  Appears mildly hypovolemic at this time.  placed her on gentle hydration.   Meningioma  Not a surgical candidate. As per previous note she informed that she was not a hospice candidate as well.   UTI  Recent urine culture growing Escherichia coli and Morganella awaits are both sensitive to Rocephin and started her on it. Has foley for past 10 days for bladder prolapse and seeing her urologist.  Deconditioning and malnutrition  consult PT and nutrition.   DVT prophylaxis: Sq lovenox   Code Status: DO NOT RESUSCITATE   Family Communication: no one  at bedside  Disposition Plan: inpatient      HPI/Subjective: Still has some left shoulder pain. C/o vague abdominal pain   Objective: Filed Vitals:   03/20/13 0458  BP: 101/64  Pulse: 62  Temp: 97.3 F (36.3 C)  Resp: 20    Intake/Output Summary (Last 24 hours) at 03/20/13 1134 Last data filed at 03/20/13 0500  Gross per 24 hour  Intake 901.25 ml  Output    600 ml  Net 301.25 ml   Filed Weights   03/19/13 1720  Weight: 44.453 kg (98 lb)    Exam: Constitutional: Vital signs reviewed. Patient is a cachectic female lying in bed in no acute distress  HEENT: No pallor , dry oral mucosa,  Cardiovascular: S1  and S2 is irregularly irregular, no murmurs rub or gallop  Pulmonary/Chest: CTAB, no wheezes, rales, or rhonchi  Abdominal: Soft. Non-tender, non-distended, bowel sounds are normal, no masses, organomegaly, or guarding present. Foley in place Musculoskeletal: No joint deformities, erythema, or stiffness, ROM full and no nontender Ext: no edema and no cyanosis  Neurological: A&O x3, nonfocal   Data Reviewed: Basic Metabolic Panel:  Recent  Labs Lab 03/19/13 1339 03/20/13 0453  NA 136 137  K 3.7 3.7  CL 103 106  CO2 21 23  GLUCOSE 161* 92  BUN 29* 32*  CREATININE 0.97 0.98  CALCIUM 12.3* 10.3   Liver Function Tests:  Recent Labs Lab 03/19/13 1618  AST 36  ALT 16  ALKPHOS 64  BILITOT 0.3  PROT 6.6  ALBUMIN 3.2*    Recent Labs Lab 03/19/13 1618  LIPASE 27   No results found for this basename: AMMONIA,  in the last 168 hours CBC:  Recent Labs Lab 03/19/13 1339 03/20/13 0453  WBC 10.9* 6.3  NEUTROABS 10.1*  --   HGB 13.3 11.4*  HCT 41.5 35.7*  MCV 89.6 90.2  PLT 234 209   Cardiac Enzymes:  Recent Labs Lab 03/19/13 1339 03/19/13 2202 03/20/13 0453 03/20/13 0957  TROPONINI <0.30 <0.30 <0.30 <0.30   BNP (last 3 results)  Recent Labs  08/14/12 1802 11/04/12 1245 11/10/12 1726  PROBNP 2072.0* 1050.0* 1130.0*   CBG: No results found for this basename: GLUCAP,  in the last 168 hours  Recent Results (from the past 240 hour(s))  URINE CULTURE     Status: None   Collection Time    03/12/13 11:55 AM      Result Value Range Status   Specimen Description URINE, CLEAN CATCH   Final   Special Requests NONE   Final   Culture  Setup Time     Final   Value: 03/12/2013 21:18     Performed at [facility] Count     Final   Value: >=100,000 COLONIES/ML     Performed at [facility]   Culture     Final   Value: ESCHERICHIA COLI     MORGANELLA MORGANII     Performed at [facility]   Report Status 03/16/2013 FINAL   Final   Organism ID, Bacteria ESCHERICHIA COLI   Final   Organism ID, Bacteria MORGANELLA MORGANII   Final  CULTURE, BLOOD (ROUTINE X 2)     Status: None   Collection Time    03/19/13  2:59 PM      Result Value Range Status   Specimen Description BLOOD RIGHT FOREARM   Final   Special Requests     Final   Value: BOTTLES DRAWN AEROBIC AND ANAEROBIC AEB 6CC ANA 5CC   Culture PENDING   Incomplete   Report Status PENDING   Incomplete  CULTURE,  BLOOD (ROUTINE X 2)     Status: None   Collection Time    03/19/13  2:59 PM      Result Value Range Status   Specimen Description BLOOD RIGHT ARM   Final   Special Requests BOTTLES DRAWN AEROBIC AND ANAEROBIC 5CC EACH   Final   Culture PENDING   Incomplete   Report Status PENDING   Incomplete     Studies: Ct Angio Chest W/cm &/or Wo Cm  03/19/2013   CLINICAL DATA:  Chest and abdominal pain. Weakness.  EXAM: CT ANGIOGRAPHY CHEST  CT ABDOMEN AND PELVIS WITH  CONTRAST  TECHNIQUE: Multidetector CT imaging of the chest was performed using the standard protocol during bolus administration of intravenous contrast. Multiplanar CT image reconstructions including MIPs were obtained to evaluate the vascular anatomy. Multidetector CT imaging of the abdomen and pelvis was performed using the standard protocol during bolus administration of intravenous contrast.  CONTRAST:  OMNIPAQUE IOHEXOL 350 MG/ML SOLN  COMPARISON:  None.  CTA chest 11/10/2012. CT abdomen and pelvis with contrast 04/19/2012.  FINDINGS: CTA CHEST FINDINGS  Pulmonary arterial opacification is excellent. No focal filling defects are evident to suggest pulmonary emboli. The heart is enlarged. There is borderline enlargement of the ascending thoracic aorta without significant change. Atherosclerotic calcifications are present throughout the thoracic aorta. Small bilateral pleural effusions are now present. Coronary artery calcifications are evident. There is no significant pericardial effusion. Rightward curvature of the lower thoracic spine is noted. Scarring in the lingula is stable. Mild of 7 is changes are present. There is mild atelectasis associated with a small effusions. And did the bone windows demonstrate exaggerated thoracic kyphosis add in addition to the scoliosis. No focal lytic or blastic lesions are present. An 18 mm right thyroid nodule is again noted.  Review of the MIP images confirms the above findings.  CT ABDOMEN and PELVIS  FINDINGS  Multiple hepatic cysts are stable. Mild intra and extra hepatic biliary dilation is similar to the prior studies. No obstructing lesion is evident. Pancreatic duct is dilated as well. The spleen is within normal limits. Did the stomach and duodenum are unremarkable. The adrenal glands are normal bilaterally. Scarring in both kidneys is stable. Extensive atherosclerotic calcifications are present in the aorta without aneurysm or change.  Diffuse diverticular changes are evident throughout the sigmoid colon without focal inflammation to suggest diverticulitis. The remainder the colon is unremarkable. And a coiled catheter is present within the urinary bladder. No significant free fluid or adenopathy is present.  The bones are osteopenic. Degenerative changes are present in the lower lumbar spine period leftward curvature is centered at L3.  Review of the MIP images confirms the above findings.  IMPRESSION: CTA CHEST IMPRESSION  1. Did no evidence for pulmonary embolus period 2. 18 mm right thyroid nodule. Ultrasound may be useful for further evaluation as clinically indicated. 3. New small bilateral pleural effusions and associated atelectasis. 4. Stable emphysema. 5. Stable cardiomegaly without failure. 6. Coronary artery disease. 7. 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  CT ABDOMEN and PELVIS IMPRESSION  1. Stable intra and extra hepatic biliary dilation as well as pancreatic duct dilation. No obstructing lesion is evident. 2. Diffuse colonic diverticulosis without evidence for diverticulitis. 3. Scoliosis and advanced degenerative changes within the lower lumbar spine. 4. Stable hepatic cysts.   Electronically Signed   By: Gennette Pac   On: 03/19/2013 16:17   Ct Abdomen Pelvis W Contrast  03/19/2013   CLINICAL DATA:  Chest and abdominal pain. Weakness.  EXAM: CT ANGIOGRAPHY CHEST  CT ABDOMEN AND PELVIS WITH CONTRAST  TECHNIQUE: Multidetector CT imaging of the chest was  performed using the standard protocol during bolus administration of intravenous contrast. Multiplanar CT image reconstructions including MIPs were obtained to evaluate the vascular anatomy. Multidetector CT imaging of the abdomen and pelvis was performed using the standard protocol during bolus administration of intravenous contrast.  CONTRAST:  OMNIPAQUE IOHEXOL 350 MG/ML SOLN  COMPARISON:  None.  CTA chest 11/10/2012. CT abdomen and pelvis with contrast 04/19/2012.  FINDINGS: CTA CHEST FINDINGS  Pulmonary  arterial opacification is excellent. No focal filling defects are evident to suggest pulmonary emboli. The heart is enlarged. There is borderline enlargement of the ascending thoracic aorta without significant change. Atherosclerotic calcifications are present throughout the thoracic aorta. Small bilateral pleural effusions are now present. Coronary artery calcifications are evident. There is no significant pericardial effusion. Rightward curvature of the lower thoracic spine is noted. Scarring in the lingula is stable. Mild of 7 is changes are present. There is mild atelectasis associated with a small effusions. And did the bone windows demonstrate exaggerated thoracic kyphosis add in addition to the scoliosis. No focal lytic or blastic lesions are present. An 18 mm right thyroid nodule is again noted.  Review of the MIP images confirms the above findings.  CT ABDOMEN and PELVIS FINDINGS  Multiple hepatic cysts are stable. Mild intra and extra hepatic biliary dilation is similar to the prior studies. No obstructing lesion is evident. Pancreatic duct is dilated as well. The spleen is within normal limits. Did the stomach and duodenum are unremarkable. The adrenal glands are normal bilaterally. Scarring in both kidneys is stable. Extensive atherosclerotic calcifications are present in the aorta without aneurysm or change.  Diffuse diverticular changes are evident throughout the sigmoid colon without focal  inflammation to suggest diverticulitis. The remainder the colon is unremarkable. And a coiled catheter is present within the urinary bladder. No significant free fluid or adenopathy is present.  The bones are osteopenic. Degenerative changes are present in the lower lumbar spine period leftward curvature is centered at L3.  Review of the MIP images confirms the above findings.  IMPRESSION: CTA CHEST IMPRESSION  1. Did no evidence for pulmonary embolus period 2. 18 mm right thyroid nodule. Ultrasound may be useful for further evaluation as clinically indicated. 3. New small bilateral pleural effusions and associated atelectasis. 4. Stable emphysema. 5. Stable cardiomegaly without failure. 6. Coronary artery disease. 7. 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  CT ABDOMEN and PELVIS IMPRESSION  1. Stable intra and extra hepatic biliary dilation as well as pancreatic duct dilation. No obstructing lesion is evident. 2. Diffuse colonic diverticulosis without evidence for diverticulitis. 3. Scoliosis and advanced degenerative changes within the lower lumbar spine. 4. Stable hepatic cysts.   Electronically Signed   By: Gennette Pac   On: 03/19/2013 16:17   Dg Chest Portable 1 View  03/19/2013   CLINICAL DATA:  Weakness, shortness of Breath  EXAM: PORTABLE CHEST - 1 VIEW  COMPARISON:  02/08/2013  FINDINGS: The heart size and mediastinal contours are within normal limits. No acute infiltrate or pulmonary edema. Stable bilateral perihilar linear atelectasis or scarring. The visualized skeletal structures are unremarkable.  IMPRESSION: No active disease. No significant change.   Electronically Signed   By: Natasha Mead   On: 03/19/2013 14:14   Dg Shoulder Left Port  03/19/2013   *RADIOLOGY REPORT*  Clinical Data: Left shoulder pain  PORTABLE LEFT SHOULDER - 2+ VIEW  Comparison: None.  Findings: No fracture or dislocation is noted.  Visualized ribs appear normal.  Narrowing of glenohumeral joint is  noted suggesting degenerative joint disease.  IMPRESSION: Mild degenerative joint disease of the glenohumeral joint.  No acute fracture or dislocation is noted.   Original Report Authenticated By: Lupita Raider.,  M.D.    Scheduled Meds: . amLODipine  5 mg Oral Daily  . aspirin  81 mg Oral Daily  . cefTRIAXone (ROCEPHIN)  IV  1 g Intravenous Q24H  . enalapril  10  mg Oral BID  . enoxaparin (LOVENOX) injection  40 mg Subcutaneous Q24H  . pantoprazole  40 mg Oral Daily  . sodium chloride  3 mL Intravenous Q12H   Continuous Infusions: . sodium chloride 75 mL/hr at 03/20/13 1119      Time spent:25 minutes    Marrisa Kimber  [facility] Pager [phone]. If 7PM-7AM, please contact night-coverage at [email or web address], password [facility] 03/20/2013, 11:34 AM  LOS: 1 day

## 2013-03-20 NOTE — Plan of Care (Signed)
Problem: Phase I Progression Outcomes Goal: Voiding-avoid urinary catheter unless indicated Outcome: Not Met (add Reason) Chronic foley catheter for prolapsed bladder

## 2013-03-20 NOTE — Care Management Note (Signed)
    Page 1 of 1   03/22/2013     1:16:55 PM   CARE MANAGEMENT NOTE 03/22/2013  Patient:  Madison Hickman, Madison Hickman   Account Number:  [id number]  Date Initiated:  03/20/2013  Documentation initiated by:  Rosemary Holms  Subjective/Objective Assessment:   Pt admitted from home where she lives alone. Pt's son lives near. Currently has RN q wk from [facility].     Action/Plan:   Anticipated DC Date:  03/22/2013   Anticipated DC Plan:        DC Planning Services  CM consult      Choice offered to / List presented to:          [facility] arranged  HH-10 DISEASE MANAGEMENT  HH-1 RN  HH-2 PT  HH-4 NURSE'S AIDE      HH agency  [facility]   Status of service:  Completed, signed off Medicare Important Message given?  YES (If response is "NO", the following Medicare IM given date fields will be blank) Date Medicare IM given:  03/22/2013 Date Additional Medicare IM given:    Discharge Disposition:  SKILLED NURSING FACILITY  Per UR Regulation:    If discussed at Long Length of Stay Meetings, dates discussed:    Comments:  03/22/13 1315 Arlyss Queen, RN BSN CM pt discharged today to [facility]. CSw to arrange discharge to facility.  03/20/13 Amy Leanord Hawking RN BSN CM Belenda Cruise with [facility] alerted that pt is inpt. Will ask to increase [facility] RN and add PT.

## 2013-03-21 DIAGNOSIS — G8929 Other chronic pain: Secondary | ICD-10-CM

## 2013-03-21 DIAGNOSIS — R627 Adult failure to thrive: Secondary | ICD-10-CM

## 2013-03-21 DIAGNOSIS — M549 Dorsalgia, unspecified: Secondary | ICD-10-CM

## 2013-03-21 NOTE — Progress Notes (Signed)
Physical Therapy Treatment Patient Details Name: Madison Hickman MRN: [id number] DOB: 1923-12-06 Today's Date: 03/21/2013 Time: 4098-1191 PT Time Calculation (min): 34 min  PT Assessment / Plan / Recommendation  History of Present Illness     PT Comments   Pt tolerated well towards therapy today with focus on improving safe mechanics with sit to stand and gait mechanics with ambulating with RW 2 sets x 24 feet.  Vc-ing was required to perform safe techniques with hand and foot placement with sit to stand, to stay within walker, posture and to increase stride length for more normalized gait mechanics and energy conservation.  Min assistance required with maneuvering around obstacles in room.  Pt left in bed per request with call bell within reach and bed alarm set for safety.    Follow Up Recommendations        Does the patient have the potential to tolerate intense rehabilitation     Barriers to Discharge        Equipment Recommendations       Recommendations for Other Services    Frequency     Progress towards PT Goals Progress towards PT goals: Progressing toward goals  Plan      Precautions / Restrictions Precautions Precautions: Fall Restrictions Weight Bearing Restrictions: No    Mobility  Bed Mobility Bed Mobility: Supine to Sit Supine to Sit: 5: Supervision Transfers Transfers: Sit to Stand;Stand to Sit Sit to Stand: 4: Min guard;From bed;With upper extremity assist;From toilet Stand to Sit: 5: Supervision;With upper extremity assist;To bed;To toilet Ambulation/Gait Ambulation/Gait Assistance: 4: Min guard Ambulation Distance (Feet): 48 Feet Assistive device: Rolling walker Gait Pattern: Narrow base of support;Trunk flexed;Left flexed knee in stance Gait velocity: WNL Stairs: No    Exercises     PT Diagnosis:    PT Problem List:   PT Treatment Interventions:     PT Goals (current goals can now be found in the care plan section)    Visit Information  Last PT Received On: 03/21/13    Subjective Data   Pt c/o chest and BLE pain increase with weight bearing.   Cognition  Cognition Arousal/Alertness: Awake/alert Behavior During Therapy: WFL for tasks assessed/performed Overall Cognitive Status: Within Functional Limits for tasks assessed    Balance     End of Session PT - End of Session Equipment Utilized During Treatment: Gait belt Activity Tolerance: Patient tolerated treatment well Patient left: in bed;with call bell/phone within reach;with bed alarm set   GP     Juel Burrow 03/21/2013, 5:19 PM

## 2013-03-21 NOTE — Clinical Social Work Placement (Signed)
Clinical Social Work Department CLINICAL SOCIAL WORK PLACEMENT NOTE 03/21/2013  Patient:  Madison Hickman, Madison Hickman  Account Number:  [id number] Admit date:  03/19/2013  Clinical Social Worker:  Derenda Fennel, LCSW  Date/time:  03/21/2013 11:35 AM  Clinical Social Work is seeking post-discharge placement for this patient at the following level of care:   SKILLED NURSING   (*CSW will update this form in Epic as items are completed)   03/21/2013  Patient/family provided with [facility] Department of Clinical Social Work's list of facilities offering this level of care within the geographic area requested by the patient (or if unable, by the patient's family).  03/21/2013  Patient/family informed of their freedom to choose among providers that offer the needed level of care, that participate in Medicare, Medicaid or managed care program needed by the patient, have an available bed and are willing to accept the patient.  03/21/2013  Patient/family informed of [facility]' ownership interest in [facility], as well as of the fact that they are under no obligation to receive care at this facility.  PASARR submitted to EDS on  PASARR number received from EDS on   FL2 transmitted to all facilities in geographic area requested by pt/family on  03/21/2013 FL2 transmitted to all facilities within larger geographic area on   Patient informed that his/her managed care company has contracts with or will negotiate with  certain facilities, including the following:     Patient/family informed of bed offers received:   Patient chooses bed at  Physician recommends and patient chooses bed at    Patient to be transferred to  on   Patient to be transferred to facility by   The following physician request were entered in Epic:   Additional Comments: pt has existing pasarr number  Derenda Fennel, LCSW [phone]
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
## 2013-03-21 NOTE — Progress Notes (Signed)
[facility] PROGRESS NOTE  Madison Hickman NWG:[id number] DOB: 1924/06/26 DOA: 03/19/2013 PCP: Milinda Antis, MD  Brief narrative  79 year old female with a history of hypertension, iron deficiency anemia, history of meningioma with chronic headaches, bladder prolapse, recent UTI, GERD who was brought to the ED by her son after she complained of pain over her left shoulder radiating down to her chest and her fingers being numb since previous night. She also reported dull epigastric pain since the previous day associated with with nausea but no vomiting. Patient recently had UTI with Escherichia coli and Morganella both of which were sensitive to Rocephin. Patient was previously on nitrofurantoin and was called for change in antibiotics but has not started yet  In the ED patient was noted to have mild leukocytosis with elevated calcium level. She was also noted to have a new onset A. fib with heart rate in 100s . A CT angiogram of the chest was done as she was saturating to 80s on room at and was negative for PE. Given her abdominal pain, lactic acid level was checked which was mildly elevated. A CT scan of the abdomen and pelvis done which was unremarkable for any obstruction although it showed dilated CBD and pancreatic duct no stones or obstruction were visible. LFTs and lipase were normal. Initial troponin was negative. I  Assessment/Plan:  Chest pain  Appears more related to left shoulder pain.. stable on telemetry. Serial troponins negative. Continue aspirin and sublingual nitroglycerin. 2-D echo unremarkable. arthritic changes x-ray of her left shoulder.   Abdominal pain  No clear etiology. Abdominal CT was unremarkable except for BAB and pancreatic dilatation without any obstruction. Lipase and LFTs have been normal. Mildly elevated lactic acid on admission which has normalized. Also had hypercalcemia which has normalized. Continue pain control and hydration. Continue PPI.   A. fib with  RVR  This is new onset. No clear etiology. Normal TSH and 2d ECHO. Patient has history of significant bradycardia on metoprolol so I have not used a rate limiting lesions. Currently in sinus  Chronic diastolic congestive heart failure  IV hydration for hypovolemia on presentation  Meningioma  Not a surgical candidate. As per previous note she informed that she was not a hospice candidate as well.   UTI  Recent urine culture growing Escherichia coli and Morganella awaits are both sensitive to Rocephin and started her on it. Has foley for past 10 days for bladder prolapse and seeing her urologist. The Rocephin and Bactrim unknown antibiotics that cover for both the orgasms. Patient can be discharged either on IM Rocephin or oral Bactrim.   Deconditioning and malnutrition  After discussion with patient's family and her PCP who had started the process of placing her in skilled nursing facility she would be appropriate for a short-term rehabilitation. Family planning on having her placed in an assisted-living facility . If continues to remain stable patient can be discharged on 9/17 DVT prophylaxis: Sq lovenox  Code Status: DO NOT RESUSCITATE  Family Communication: no one at bedside  Disposition Plan: DC to skilled nursing facility on 9/17   HPI/Subjective:  Patient still complains of vague generalized pain   Objective: Filed Vitals:   03/21/13 1423  BP: 114/49  Pulse: 70  Temp: 98.4 F (36.9 C)  Resp: 20    Intake/Output Summary (Last 24 hours) at 03/21/13 1636 Last data filed at 03/21/13 1300  Gross per 24 hour  Intake   1080 ml  Output    500  ml  Net    580 ml   Filed Weights   03/19/13 1720  Weight: 44.453 kg (98 lb)    Exam:  Constitutional: Vital signs reviewed. Patient is a cachectic female lying in bed in no acute distress  HEENT: No pallor , dry oral mucosa,  Cardiovascular: S1 and S2 is irregularly irregular, no murmurs rub or gallop  Pulmonary/Chest: CTAB, no  wheezes, rales, or rhonchi  Abdominal: Soft. Non-tender, non-distended, bowel sounds are normal, no masses, organomegaly, or guarding present. Foley in place Musculoskeletal: No joint deformities, erythema, or stiffness, ROM full and no nontender Ext: no edema and no cyanosis  Neurological: A&O x3, nonfocal  Data Reviewed: Basic Metabolic Panel:  Recent Labs Lab 03/19/13 1339 03/20/13 0453  NA 136 137  K 3.7 3.7  CL 103 106  CO2 21 23  GLUCOSE 161* 92  BUN 29* 32*  CREATININE 0.97 0.98  CALCIUM 12.3* 10.3   Liver Function Tests:  Recent Labs Lab 03/19/13 1618  AST 36  ALT 16  ALKPHOS 64  BILITOT 0.3  PROT 6.6  ALBUMIN 3.2*    Recent Labs Lab 03/19/13 1618  LIPASE 27   No results found for this basename: AMMONIA,  in the last 168 hours CBC:  Recent Labs Lab 03/19/13 1339 03/20/13 0453  WBC 10.9* 6.3  NEUTROABS 10.1*  --   HGB 13.3 11.4*  HCT 41.5 35.7*  MCV 89.6 90.2  PLT 234 209   Cardiac Enzymes:  Recent Labs Lab 03/19/13 1339 03/19/13 2202 03/20/13 0453 03/20/13 0957  TROPONINI <0.30 <0.30 <0.30 <0.30   BNP (last 3 results)  Recent Labs  08/14/12 1802 11/04/12 1245 11/10/12 1726  PROBNP 2072.0* 1050.0* 1130.0*   CBG: No results found for this basename: GLUCAP,  in the last 168 hours  Recent Results (from the past 240 hour(s))  URINE CULTURE     Status: None   Collection Time    03/12/13 11:55 AM      Result Value Range Status   Specimen Description URINE, CLEAN CATCH   Final   Special Requests NONE   Final   Culture  Setup Time     Final   Value: 03/12/2013 21:18     Performed at [facility] Count     Final   Value: >=100,000 COLONIES/ML     Performed at [facility]   Culture     Final   Value: ESCHERICHIA COLI     MORGANELLA MORGANII     Performed at [facility]   Report Status 03/16/2013 FINAL   Final   Organism ID, Bacteria ESCHERICHIA COLI   Final   Organism ID, Bacteria MORGANELLA  MORGANII   Final  CULTURE, BLOOD (ROUTINE X 2)     Status: None   Collection Time    03/19/13  2:59 PM      Result Value Range Status   Specimen Description BLOOD RIGHT FOREARM   Final   Special Requests     Final   Value: BOTTLES DRAWN AEROBIC AND ANAEROBIC AEB 6CC ANA 5CC   Culture NO GROWTH 2 DAYS   Final   Report Status PENDING   Incomplete  CULTURE, BLOOD (ROUTINE X 2)     Status: None   Collection Time    03/19/13  2:59 PM      Result Value Range Status   Specimen Description BLOOD RIGHT ARM   Final   Special Requests BOTTLES DRAWN AEROBIC AND  ANAEROBIC 5CC EACH   Final   Culture NO GROWTH 2 DAYS   Final   Report Status PENDING   Incomplete  URINE CULTURE     Status: None   Collection Time    03/19/13  4:09 PM      Result Value Range Status   Specimen Description URINE, CATHETERIZED   Final   Special Requests NONE   Final   Culture  Setup Time     Final   Value: 03/19/2013 21:58     Performed at [facility] Count     Final   Value: 60,000 COLONIES/ML     Performed at [facility]   Culture     Final   Value: GRAM NEGATIVE RODS     Performed at [facility]   Report Status PENDING   Incomplete     Studies: Dg Shoulder Left Port  03/19/2013   *RADIOLOGY REPORT*  Clinical Data: Left shoulder pain  PORTABLE LEFT SHOULDER - 2+ VIEW  Comparison: None.  Findings: No fracture or dislocation is noted.  Visualized ribs appear normal.  Narrowing of glenohumeral joint is noted suggesting degenerative joint disease.  IMPRESSION: Mild degenerative joint disease of the glenohumeral joint.  No acute fracture or dislocation is noted.   Original Report Authenticated By: Lupita Raider.,  M.D.    Scheduled Meds: . amLODipine  5 mg Oral Daily  . aspirin  81 mg Oral Daily  . cefTRIAXone (ROCEPHIN)  IV  1 g Intravenous Q24H  . enalapril  10 mg Oral BID  . enoxaparin (LOVENOX) injection  30 mg Subcutaneous Q24H  . feeding supplement  237 mL Oral BID BM   . pantoprazole  40 mg Oral Daily  . sodium chloride  3 mL Intravenous Q12H   Continuous Infusions: . sodium chloride 75 mL/hr at 03/21/13 1415      Time spent: 25 minutes    Wilborn Membreno  [facility] Pager [phone]. If 7PM-7AM, please contact night-coverage at [email or web address], password [email or web address] 03/21/2013, 4:36 PM  LOS: 2 days

## 2013-03-22 ENCOUNTER — Encounter: Payer: Self-pay | Admitting: *Deleted

## 2013-03-22 ENCOUNTER — Encounter (HOSPITAL_COMMUNITY): Payer: Self-pay | Admitting: Internal Medicine

## 2013-03-22 ENCOUNTER — Ambulatory Visit: Payer: Medicare Other | Admitting: Obstetrics and Gynecology

## 2013-03-22 DIAGNOSIS — I517 Cardiomegaly: Secondary | ICD-10-CM

## 2013-03-22 DIAGNOSIS — N8111 Cystocele, midline: Secondary | ICD-10-CM

## 2013-03-22 HISTORY — DX: Cardiomegaly: I51.7

## 2013-03-22 LAB — URINE CULTURE: Colony Count: 60000

## 2013-03-22 MED ORDER — NITROGLYCERIN 0.4 MG SL SUBL: 0.4000 mg | SUBLINGUAL_TABLET | SUBLINGUAL | Status: AC | PRN

## 2013-03-22 MED ORDER — HYDROCODONE-ACETAMINOPHEN 7.5-325 MG PO TABS: 1.0000 | ORAL_TABLET | Freq: Four times a day (QID) | ORAL | Status: DC | PRN

## 2013-03-22 MED ORDER — CEFUROXIME AXETIL 500 MG PO TABS: 500.0000 mg | ORAL_TABLET | Freq: Two times a day (BID) | ORAL | Status: DC

## 2013-03-22 MED ORDER — ASPIRIN 81 MG PO CHEW: 81.0000 mg | CHEWABLE_TABLET | Freq: Every day | ORAL | Status: DC

## 2013-03-22 MED ORDER — ENSURE COMPLETE PO LIQD: 237.0000 mL | Freq: Two times a day (BID) | ORAL | Status: DC

## 2013-03-22 NOTE — Clinical Social Work Placement (Signed)
Clinical Social Work Department CLINICAL SOCIAL WORK PLACEMENT NOTE 03/22/2013  Patient:  Madison Hickman, Madison Hickman  Account Number:  [id number] Admit date:  03/19/2013  Clinical Social Worker:  Derenda Fennel, LCSW  Date/time:  03/21/2013 11:35 AM  Clinical Social Work is seeking post-discharge placement for this patient at the following level of care:   SKILLED NURSING   (*CSW will update this form in Epic as items are completed)   03/21/2013  Patient/family provided with [facility] Department of Clinical Social Work's list of facilities offering this level of care within the geographic area requested by the patient (or if unable, by the patient's family).  03/21/2013  Patient/family informed of their freedom to choose among providers that offer the needed level of care, that participate in Medicare, Medicaid or managed care program needed by the patient, have an available bed and are willing to accept the patient.  03/21/2013  Patient/family informed of [facility]' ownership interest in [facility], as well as of the fact that they are under no obligation to receive care at this facility.  PASARR submitted to EDS on  PASARR number received from EDS on   FL2 transmitted to all facilities in geographic area requested by pt/family on  03/21/2013 FL2 transmitted to all facilities within larger geographic area on   Patient informed that his/her managed care company has contracts with or will negotiate with  certain facilities, including the following:     Patient/family informed of bed offers received:  03/22/2013 Patient chooses bed at [facility] [place] Physician recommends and patient chooses bed at  [facility] [place]  Patient to be transferred to [facility] [place] on  03/22/2013 Patient to be transferred to facility by family arranging  The following physician request were entered in Epic:   Additional Comments: pt has existing pasarr number  Derenda Fennel,  LCSW [phone]

## 2013-03-22 NOTE — Progress Notes (Signed)
44 - Dr. Sherrie Mustache returned page.  Stated that she saw the text about the pain and EKG.  Stated that this pain has been occurring with the patient and appears to be chronic.  She will be up to see the patient.

## 2013-03-22 NOTE — Progress Notes (Signed)
Pt's EKG results abnormal.  Dr. Sherrie Mustache notified via text page.

## 2013-03-22 NOTE — Progress Notes (Addendum)
1440 - Pt transported by NT via w/c to main entrance for discharge.  Pt's daughter provided transportation to [facility] SNF.  Daughter given patient packet for [facility].  Pt's foley intact at time of discharge.  Patient to follow-up with Dr. Emelda Fear regarding foley - [facility] aware.  Pt stable at time of discharge.

## 2013-03-22 NOTE — Progress Notes (Signed)
Report called to Sanah at [facility].

## 2013-03-22 NOTE — Progress Notes (Signed)
Patient eating breakfast and complained of left side chest pain.  Daughter at bedside.  Stat EKG ordered.

## 2013-03-22 NOTE — Progress Notes (Signed)
Patient's oxygen saturation 94-95% on room after being off oxygen approximately 20 minutes.  Will continue to monitor.

## 2013-03-22 NOTE — Progress Notes (Signed)
Pt's oxygen saturation on room air 93%.  Patient has been off oxygen approximately 35 minutes.  Patient has no SOB.  No complaints.  Daughter at bedside.

## 2013-03-22 NOTE — Clinical Social Work Note (Signed)
CSW provided letter for court stating pt is admitted to hospital at request of pt's daughter. Original given to her and she plans to fax. Copy on shadow chart.   Derenda Fennel, [place] [phone]

## 2013-03-22 NOTE — Clinical Social Work Note (Signed)
Pt d/c today to SNF. CSW presented bed offers to pt and daughter. Pt chooses [facility]. Facility notified. Daughter willing to provide transportation. D/C summary faxed.  Derenda Fennel, [place] [phone]

## 2013-03-22 NOTE — Discharge Summary (Signed)
Physician Discharge Summary  Madison Hickman:[id number] DOB: [birth year] DOA: 03/19/2013  PCP: Milinda Antis, MD  Admit date: 03/19/2013 Discharge date: 03/22/2013  Time spent: Greater than 30 minutes  Recommendations for Outpatient Follow-up:  1. The patient has an indwelling Foley catheter which was left in at the time of discharge. She will need to followup with Dr. Emelda Fear as scheduled to address the bladder prolapse and discontinuation of the Foley catheter. The patient may need a referral to urology, but this will be deferred to Dr. Emelda Fear.  Discharge Diagnoses:   1. Chest pain, myocardial infarction ruled out. CT angiogram negative for PE. Likely musculoskeletal in origin. 2. Abdominal pain. CT of the abdomen and pelvis unremarkable for acute findings. (Stable biliary and pancreatic ductal dilatation). 3. Urinary tract infection. Urine culture pending at the time of discharge. 4. Indwelling Foley catheter x2 weeks secondary to prolapsed bladder. 5. Newly diagnosed paroxysmal atrial fibrillation. Rate controlled off of medications. History of significant bradycardia on metoprolol in the past. Converted to normal sinus rhythm. 6. Chronic diastolic heart failure. Stable. 7. Hypercalcemia secondary to dehydration and calcium supplementation. 8. Severe malnutrition. 9. Hypertension. 10. Chronic meningioma, not a surgical candidate. 11. Normocytic anemia. 12. Chronic left shoulder pain, secondary degenerative joint disease. 13. Mild lactic acidosis, resolved. 14. Physical deconditioning. 15. 18 mm right thyroid nodule. Consider outpatient surveillance with an ultrasound of the neck. 16. DO NOT RESUSCITATE status.   Discharge Condition: Stable.  Diet recommendation: Heart healthy.  Filed Weights   03/19/13 1720  Weight: 44.453 kg (98 lb)    History of present illness:  The patient is an 79 year old woman with a history significant for iron deficiency anemia, hypertension,  meningioma with chronic headaches, bladder prolapse, and recent UTI, who presented to the emergency department on 03/19/2013 with left shoulder pain and chest pain. She also complained of abdominal pain for one day. In the emergency department, she was afebrile and hemodynamically stable. Her lab data were significant for glucose of 161, BUN of 29, calcium of 12.3, normal LFTs, normal lipase, WBC of 10.9, and normal troponin I. CT angiogram of her chest revealed no evidence of pulmonary emboli; coronary artery calcifications; and an 18 mm right thyroid nodule again noted. CT of her abdomen and pelvis revealed stable intra-and extra hepatic biliary dilatation as well as pancreatic ductal dilatation; no obstructive lesions; diffuse colonic diverticulosis without evidence for diverticulitis, and advanced scoliosis and degenerative joint changes of the left lower spine. She was admitted for further evaluation and management.  Hospital Course:   1. Chest pain. The patient was started on aspirin and as needed supplemental nitroglycerin. She was monitored on telemetry. Analgesics were ordered for pain. For further evaluation, a number of studies were ordered. All of her cardiac enzymes were well within normal limits, and therefore, she ruled out for myocardial infarction. 2-D echocardiogram was ordered and revealed preserved systolic LV function with an ejection fraction of 50-55% and moderate LVH. Her chest pain subsided. It was believed that her pain was radiating from her left shoulder which has degenerative joint changes.  2. Atrial fibrillation with brief RVR. As above, CT angiogram of her chest revealed no pulmonary embolism. 2-D echocardiogram revealed no significant valvular abnormalities. Her rhythm eventually converted to normal sinus rhythm. Her heart rate normalized. Metoprolol was considered, but was discontinued when she was found to have a history of significant bradycardia. She was started on aspirin  empirically.  3.. Abdominal pain. The etiology was unclear other than possibly  a urinary tract infection. CT of her abdomen revealed no acute findings. Her lipase and liver transaminases were within normal limits. She was continued on her PPI.  4. Urinary tract infection. The patient had been treated with Macrodantin prior to this hospitalization for her UTI. Outpatient urine culture revealed Escherichia coli and Morganella, both sensitive to Rocephin. Therefore, she was started on Rocephin. Another urine culture was ordered. The results were pending at the time of discharge, but was growing out 60,000 colonies of gram-negative rods. Rocephin was discontinued. She will be continued on Ceftin for 5 more days. Of note, the patient has had an indwelling Foley catheter for 2 weeks.  5. Chronic bladder prolapse. The patient is followed by gynecologist, Dr. Emelda Fear. Per her family, the Foley catheter was inserted per Dr. Emelda Fear a couple of weeks ago. Apparently she has not been evaluated by urologist. The recommendation is for her to followup with Dr. Emelda Fear for further evaluation and possibly referral to urologist. For now, the Foley catheter will be kept in until her reevaluation by Dr. Emelda Fear.  6. Hypercalcemia secondary to dehydration. The patient was hydrated during the hospitalization. Calcium supplementation was withheld. Her serum calcium normalized.   Procedures: 2-D echocardiogram:   Study Conclusions  - Left ventricle: There was moderate concentric hypertrophy. Systolic function was normal. The estimated ejection fraction was in the range of 50% to 55%. The study is not technically sufficient to allow evaluation of LV diastolic function, due to underlying atrial fibrillation. - Ventricular septum: Septal motion showed abnormal function and dyssynergy. - Aortic valve: Trileaflet; moderately thickened, mildly calcified leaflets. No hemodynamically significant aortic stenosis. -  Mitral valve: Calcified annulus. Moderately thickened leaflets . Mild regurgitation. - Left atrium: The atrium was mildly dilated. - Tricuspid valve: Mild regurgitation. - Pulmonary arteries: PA peak pressure: 34mm Hg (S). Transthoracic echocardiography. M-mode, complete 2D, spectral Doppler, and color Doppler. Height: Height: 157.5cm. Height: 62in. Weight: Weight: 44.5kg. Weight: 97.8lb. Body mass index: BMI: 17.9kg/m^2. Body surface area: BSA: 1.28m^2. Blood pressure: 128/84. Patient status: Inpatient. Location: Bedside.  Consultations:  None  Discharge Exam: Filed Vitals:   03/22/13 1113  BP: 152/77  Pulse: 67  Temp:   Resp:     General: Pleasant alert elderly 79 year old Caucasian woman laying in bed, in no acute distress. Cardiovascular: S1, S2, with no murmurs rubs or gallops. Respiratory: Clear to auscultation bilaterally.  Discharge Instructions      Discharge Orders   Future Appointments Provider Department Dept Phone   03/22/2013 3:00 PM Tilda Burrow, MD [facility] [phone]   03/29/2013 11:15 AM Tilda Burrow, MD [facility] [phone]   Future Orders Complete By Expires   Diet - low sodium heart healthy  As directed    Increase activity slowly  As directed        Medication List    STOP taking these medications       calcium carbonate 500 MG chewable tablet  Commonly known as:  TUMS - dosed in mg elemental calcium     cephALEXin 500 MG capsule  Commonly known as:  KEFLEX     ibuprofen 200 MG tablet  Commonly known as:  ADVIL,MOTRIN     nitrofurantoin (macrocrystal-monohydrate) 100 MG capsule  Commonly known as:  MACROBID      TAKE these medications       amLODipine 5 MG tablet  Commonly known as:  NORVASC  Take 1 tablet (5 mg total) by mouth daily. For blood pressure  aspirin 81 MG chewable tablet  Chew 1 tablet (81 mg total) by mouth daily.     cefUROXime 500 MG tablet  Commonly known as:  CEFTIN  Take 1  tablet (500 mg total) by mouth 2 (two) times daily.     docusate sodium 100 MG capsule  Commonly known as:  COLACE  Take 100 mg by mouth daily as needed for constipation.     enalapril 10 MG tablet  Commonly known as:  VASOTEC  Take 1 tablet (10 mg total) by mouth 2 (two) times daily.     feeding supplement Liqd  Take 237 mLs by mouth 2 (two) times daily between meals.     HYDROcodone-acetaminophen 7.5-325 MG per tablet  Commonly known as:  NORCO  Take 1 tablet by mouth every 6 (six) hours as needed for pain.     nitroGLYCERIN 0.4 MG SL tablet  Commonly known as:  NITROSTAT  Place 1 tablet (0.4 mg total) under the tongue every 5 (five) minutes as needed for chest pain.     pantoprazole 40 MG tablet  Commonly known as:  PROTONIX  Take 1 tablet (40 mg total) by mouth daily. 30 minutes before breakfast for GERD       No Known Allergies Follow-up Information   Follow up with Tilda Burrow, MD On 03/29/2013. (at 11:15 am)    Specialties:  Obstetrics and Gynecology, Radiology   Contact information:   [address] [place] [address] [phone]        The results of significant diagnostics from this hospitalization (including imaging, microbiology, ancillary and laboratory) are listed below for reference.    Significant Diagnostic Studies: Ct Angio Chest W/cm &/or Wo Cm  03/19/2013   CLINICAL DATA:  Chest and abdominal pain. Weakness.  EXAM: CT ANGIOGRAPHY CHEST  CT ABDOMEN AND PELVIS WITH CONTRAST  TECHNIQUE: Multidetector CT imaging of the chest was performed using the standard protocol during bolus administration of intravenous contrast. Multiplanar CT image reconstructions including MIPs were obtained to evaluate the vascular anatomy. Multidetector CT imaging of the abdomen and pelvis was performed using the standard protocol during bolus administration of intravenous contrast.  CONTRAST:  OMNIPAQUE IOHEXOL 350 MG/ML SOLN  COMPARISON:  None.  CTA chest  11/10/2012. CT abdomen and pelvis with contrast 04/19/2012.  FINDINGS: CTA CHEST FINDINGS  Pulmonary arterial opacification is excellent. No focal filling defects are evident to suggest pulmonary emboli. The heart is enlarged. There is borderline enlargement of the ascending thoracic aorta without significant change. Atherosclerotic calcifications are present throughout the thoracic aorta. Small bilateral pleural effusions are now present. Coronary artery calcifications are evident. There is no significant pericardial effusion. Rightward curvature of the lower thoracic spine is noted. Scarring in the lingula is stable. Mild of 7 is changes are present. There is mild atelectasis associated with a small effusions. And did the bone windows demonstrate exaggerated thoracic kyphosis add in addition to the scoliosis. No focal lytic or blastic lesions are present. An 18 mm right thyroid nodule is again noted.  Review of the MIP images confirms the above findings.  CT ABDOMEN and PELVIS FINDINGS  Multiple hepatic cysts are stable. Mild intra and extra hepatic biliary dilation is similar to the prior studies. No obstructing lesion is evident. Pancreatic duct is dilated as well. The spleen is within normal limits. Did the stomach and duodenum are unremarkable. The adrenal glands are normal bilaterally. Scarring in both kidneys is stable. Extensive atherosclerotic calcifications are present in  the aorta without aneurysm or change.  Diffuse diverticular changes are evident throughout the sigmoid colon without focal inflammation to suggest diverticulitis. The remainder the colon is unremarkable. And a coiled catheter is present within the urinary bladder. No significant free fluid or adenopathy is present.  The bones are osteopenic. Degenerative changes are present in the lower lumbar spine period leftward curvature is centered at L3.  Review of the MIP images confirms the above findings.  IMPRESSION: CTA CHEST IMPRESSION  1.  Did no evidence for pulmonary embolus period 2. 18 mm right thyroid nodule. Ultrasound may be useful for further evaluation as clinically indicated. 3. New small bilateral pleural effusions and associated atelectasis. 4. Stable emphysema. 5. Stable cardiomegaly without failure. 6. Coronary artery disease. 7. 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  CT ABDOMEN and PELVIS IMPRESSION  1. Stable intra and extra hepatic biliary dilation as well as pancreatic duct dilation. No obstructing lesion is evident. 2. Diffuse colonic diverticulosis without evidence for diverticulitis. 3. Scoliosis and advanced degenerative changes within the lower lumbar spine. 4. Stable hepatic cysts.   Electronically Signed   By: Gennette Pac   On: 03/19/2013 16:17   Ct Abdomen Pelvis W Contrast  03/19/2013   CLINICAL DATA:  Chest and abdominal pain. Weakness.  EXAM: CT ANGIOGRAPHY CHEST  CT ABDOMEN AND PELVIS WITH CONTRAST  TECHNIQUE: Multidetector CT imaging of the chest was performed using the standard protocol during bolus administration of intravenous contrast. Multiplanar CT image reconstructions including MIPs were obtained to evaluate the vascular anatomy. Multidetector CT imaging of the abdomen and pelvis was performed using the standard protocol during bolus administration of intravenous contrast.  CONTRAST:  OMNIPAQUE IOHEXOL 350 MG/ML SOLN  COMPARISON:  None.  CTA chest 11/10/2012. CT abdomen and pelvis with contrast 04/19/2012.  FINDINGS: CTA CHEST FINDINGS  Pulmonary arterial opacification is excellent. No focal filling defects are evident to suggest pulmonary emboli. The heart is enlarged. There is borderline enlargement of the ascending thoracic aorta without significant change. Atherosclerotic calcifications are present throughout the thoracic aorta. Small bilateral pleural effusions are now present. Coronary artery calcifications are evident. There is no significant pericardial effusion.  Rightward curvature of the lower thoracic spine is noted. Scarring in the lingula is stable. Mild of 7 is changes are present. There is mild atelectasis associated with a small effusions. And did the bone windows demonstrate exaggerated thoracic kyphosis add in addition to the scoliosis. No focal lytic or blastic lesions are present. An 18 mm right thyroid nodule is again noted.  Review of the MIP images confirms the above findings.  CT ABDOMEN and PELVIS FINDINGS  Multiple hepatic cysts are stable. Mild intra and extra hepatic biliary dilation is similar to the prior studies. No obstructing lesion is evident. Pancreatic duct is dilated as well. The spleen is within normal limits. Did the stomach and duodenum are unremarkable. The adrenal glands are normal bilaterally. Scarring in both kidneys is stable. Extensive atherosclerotic calcifications are present in the aorta without aneurysm or change.  Diffuse diverticular changes are evident throughout the sigmoid colon without focal inflammation to suggest diverticulitis. The remainder the colon is unremarkable. And a coiled catheter is present within the urinary bladder. No significant free fluid or adenopathy is present.  The bones are osteopenic. Degenerative changes are present in the lower lumbar spine period leftward curvature is centered at L3.  Review of the MIP images confirms the above findings.  IMPRESSION: CTA CHEST IMPRESSION  1. Did  no evidence for pulmonary embolus period 2. 18 mm right thyroid nodule. Ultrasound may be useful for further evaluation as clinically indicated. 3. New small bilateral pleural effusions and associated atelectasis. 4. Stable emphysema. 5. Stable cardiomegaly without failure. 6. Coronary artery disease. 7. 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  CT ABDOMEN and PELVIS IMPRESSION  1. Stable intra and extra hepatic biliary dilation as well as pancreatic duct dilation. No obstructing lesion is evident. 2.  Diffuse colonic diverticulosis without evidence for diverticulitis. 3. Scoliosis and advanced degenerative changes within the lower lumbar spine. 4. Stable hepatic cysts.   Electronically Signed   By: Gennette Pac   On: 03/19/2013 16:17   Dg Chest Portable 1 View  03/19/2013   CLINICAL DATA:  Weakness, shortness of Breath  EXAM: PORTABLE CHEST - 1 VIEW  COMPARISON:  02/08/2013  FINDINGS: The heart size and mediastinal contours are within normal limits. No acute infiltrate or pulmonary edema. Stable bilateral perihilar linear atelectasis or scarring. The visualized skeletal structures are unremarkable.  IMPRESSION: No active disease. No significant change.   Electronically Signed   By: Natasha Mead   On: 03/19/2013 14:14   Dg Shoulder Left Port  03/19/2013   *RADIOLOGY REPORT*  Clinical Data: Left shoulder pain  PORTABLE LEFT SHOULDER - 2+ VIEW  Comparison: None.  Findings: No fracture or dislocation is noted.  Visualized ribs appear normal.  Narrowing of glenohumeral joint is noted suggesting degenerative joint disease.  IMPRESSION: Mild degenerative joint disease of the glenohumeral joint.  No acute fracture or dislocation is noted.   Original Report Authenticated By: Lupita Raider.,  M.D.    Microbiology: Recent Results (from the past 240 hour(s))  CULTURE, BLOOD (ROUTINE X 2)     Status: None   Collection Time    03/19/13  2:59 PM      Result Value Range Status   Specimen Description BLOOD RIGHT FOREARM   Final   Special Requests     Final   Value: BOTTLES DRAWN AEROBIC AND ANAEROBIC AEB 6CC ANA 5CC   Culture NO GROWTH 3 DAYS   Final   Report Status PENDING   Incomplete  CULTURE, BLOOD (ROUTINE X 2)     Status: None   Collection Time    03/19/13  2:59 PM      Result Value Range Status   Specimen Description BLOOD RIGHT ARM   Final   Special Requests BOTTLES DRAWN AEROBIC AND ANAEROBIC 5CC EACH   Final   Culture NO GROWTH 3 DAYS   Final   Report Status PENDING   Incomplete  URINE  CULTURE     Status: None   Collection Time    03/19/13  4:09 PM      Result Value Range Status   Specimen Description URINE, CATHETERIZED   Final   Special Requests NONE   Final   Culture  Setup Time     Final   Value: 03/19/2013 21:58     Performed at [facility] Count     Final   Value: 60,000 COLONIES/ML     Performed at [facility]   Culture     Final   Value: GRAM NEGATIVE RODS     Performed at [facility]   Report Status PENDING   Incomplete     Labs: Basic Metabolic Panel:  Recent Labs Lab 03/19/13 1339 03/20/13 0453  NA 136 137  K 3.7 3.7  CL  103 106  CO2 21 23  GLUCOSE 161* 92  BUN 29* 32*  CREATININE 0.97 0.98  CALCIUM 12.3* 10.3   Liver Function Tests:  Recent Labs Lab 03/19/13 1618  AST 36  ALT 16  ALKPHOS 64  BILITOT 0.3  PROT 6.6  ALBUMIN 3.2*    Recent Labs Lab 03/19/13 1618  LIPASE 27   No results found for this basename: AMMONIA,  in the last 168 hours CBC:  Recent Labs Lab 03/19/13 1339 03/20/13 0453  WBC 10.9* 6.3  NEUTROABS 10.1*  --   HGB 13.3 11.4*  HCT 41.5 35.7*  MCV 89.6 90.2  PLT 234 209   Cardiac Enzymes:  Recent Labs Lab 03/19/13 1339 03/19/13 2202 03/20/13 0453 03/20/13 0957  TROPONINI <0.30 <0.30 <0.30 <0.30   BNP: BNP (last 3 results)  Recent Labs  08/14/12 1802 11/04/12 1245 11/10/12 1726  PROBNP 2072.0* 1050.0* 1130.0*   CBG: No results found for this basename: GLUCAP,  in the last 168 hours     Signed:  Shatina Streets  [facility] 03/22/2013, 12:38 PM

## 2013-03-24 LAB — CULTURE, BLOOD (ROUTINE X 2)
Culture: NO GROWTH
Culture: NO GROWTH

## 2013-03-29 ENCOUNTER — Ambulatory Visit (INDEPENDENT_AMBULATORY_CARE_PROVIDER_SITE_OTHER): Payer: Medicare Other | Admitting: Obstetrics and Gynecology

## 2013-03-29 ENCOUNTER — Encounter: Payer: Self-pay | Admitting: Obstetrics and Gynecology

## 2013-03-29 VITALS — BP 148/70

## 2013-03-29 DIAGNOSIS — N993 Prolapse of vaginal vault after hysterectomy: Secondary | ICD-10-CM

## 2013-03-29 NOTE — Patient Instructions (Signed)
Please to the nursing home reinsert the Foley catheter if the patient cannot urinate in 12 hours We will reassess how she is tolerating the pessary in one month to 6 weeks

## 2013-03-29 NOTE — Progress Notes (Signed)
Patient ID: Madison Hickman, female   DOB: 05-26-1924, 79 y.o.   MRN: [id number] Pt here for pessary insertion  Patient seen here in followup of acute urinary retention seen in the emergency room approximately a month ago. Pessary is been ordered and 2.5 inch Gellhorn pessary is brought by family for insertion Pessary is inserted without difficulty and gives good support. Patient states that she is actually more comfortable with pessary in place. Foley catheter is removed. Instructions regarding replacement of catheter if patient is not voiding and 12 hours as advised to the patient's family Patient will be returning to [facility] nursing home for continued care Return 4-6 weeks for reassessment

## 2013-04-25 ENCOUNTER — Ambulatory Visit (INDEPENDENT_AMBULATORY_CARE_PROVIDER_SITE_OTHER): Payer: Medicare Other | Admitting: Urology

## 2013-04-25 ENCOUNTER — Encounter (INDEPENDENT_AMBULATORY_CARE_PROVIDER_SITE_OTHER): Payer: Self-pay

## 2013-04-25 DIAGNOSIS — N8111 Cystocele, midline: Secondary | ICD-10-CM

## 2013-05-01 ENCOUNTER — Encounter (INDEPENDENT_AMBULATORY_CARE_PROVIDER_SITE_OTHER): Payer: Self-pay

## 2013-05-01 ENCOUNTER — Encounter: Payer: Self-pay | Admitting: Obstetrics and Gynecology

## 2013-05-01 ENCOUNTER — Telehealth: Payer: Self-pay | Admitting: *Deleted

## 2013-05-01 ENCOUNTER — Ambulatory Visit: Payer: Medicare Other | Admitting: Obstetrics and Gynecology

## 2013-05-01 ENCOUNTER — Ambulatory Visit (INDEPENDENT_AMBULATORY_CARE_PROVIDER_SITE_OTHER): Payer: Medicare Other | Admitting: Obstetrics and Gynecology

## 2013-05-01 VITALS — BP 138/70 | Ht 62.0 in | Wt 90.2 lb

## 2013-05-01 DIAGNOSIS — N812 Incomplete uterovaginal prolapse: Secondary | ICD-10-CM

## 2013-05-01 DIAGNOSIS — Z4689 Encounter for fitting and adjustment of other specified devices: Secondary | ICD-10-CM

## 2013-05-01 DIAGNOSIS — N993 Prolapse of vaginal vault after hysterectomy: Secondary | ICD-10-CM

## 2013-05-01 NOTE — Telephone Encounter (Signed)
Spoke with Harriett Sine, nurse at [facility]. Advised to finish Bactrim. Harriett Sine states pt started this on 04/29/13. JSY

## 2013-05-01 NOTE — Patient Instructions (Signed)
Pessary is working well, no need for change in care.

## 2013-05-01 NOTE — Progress Notes (Signed)
Patient ID: Madison Hickman, female   DOB: [birth year], 79 y.o.   MRN: [id number] Pt here today as a follow up for pessary insertion on 03/29/2013. Pessary exam done, good support in place. No d/c or erythema.  A : uterovag prolapse, with pessary in pale. Stable

## 2013-05-05 ENCOUNTER — Ambulatory Visit: Payer: Medicare Other | Admitting: Obstetrics and Gynecology

## 2013-09-07 ENCOUNTER — Encounter (HOSPITAL_COMMUNITY): Payer: Self-pay | Admitting: Emergency Medicine

## 2013-09-07 ENCOUNTER — Inpatient Hospital Stay (HOSPITAL_COMMUNITY)
Admission: EM | Admit: 2013-09-07 | Discharge: 2013-09-14 | DRG: 480 | Disposition: A | Discharge status: Skilled Nursing Facility | Payer: Medicare Other | Attending: Internal Medicine | Admitting: Internal Medicine

## 2013-09-07 ENCOUNTER — Emergency Department (HOSPITAL_COMMUNITY): Payer: Medicare Other

## 2013-09-07 DIAGNOSIS — K5900 Constipation, unspecified: Secondary | ICD-10-CM | POA: Diagnosis not present

## 2013-09-07 DIAGNOSIS — N993 Prolapse of vaginal vault after hysterectomy: Secondary | ICD-10-CM

## 2013-09-07 DIAGNOSIS — D509 Iron deficiency anemia, unspecified: Secondary | ICD-10-CM

## 2013-09-07 DIAGNOSIS — Z86011 Personal history of benign neoplasm of the brain: Secondary | ICD-10-CM

## 2013-09-07 DIAGNOSIS — N8110 Cystocele, unspecified: Secondary | ICD-10-CM

## 2013-09-07 DIAGNOSIS — E611 Iron deficiency: Secondary | ICD-10-CM

## 2013-09-07 DIAGNOSIS — I517 Cardiomegaly: Secondary | ICD-10-CM

## 2013-09-07 DIAGNOSIS — W19XXXA Unspecified fall, initial encounter: Secondary | ICD-10-CM | POA: Diagnosis present

## 2013-09-07 DIAGNOSIS — E43 Unspecified severe protein-calorie malnutrition: Secondary | ICD-10-CM

## 2013-09-07 DIAGNOSIS — M545 Low back pain, unspecified: Secondary | ICD-10-CM | POA: Diagnosis present

## 2013-09-07 DIAGNOSIS — R627 Adult failure to thrive: Secondary | ICD-10-CM

## 2013-09-07 DIAGNOSIS — E46 Unspecified protein-calorie malnutrition: Secondary | ICD-10-CM

## 2013-09-07 DIAGNOSIS — Y9210 Unspecified residential institution as the place of occurrence of the external cause: Secondary | ICD-10-CM | POA: Diagnosis present

## 2013-09-07 DIAGNOSIS — G936 Cerebral edema: Secondary | ICD-10-CM

## 2013-09-07 DIAGNOSIS — R21 Rash and other nonspecific skin eruption: Secondary | ICD-10-CM

## 2013-09-07 DIAGNOSIS — Z681 Body mass index (BMI) 19 or less, adult: Secondary | ICD-10-CM

## 2013-09-07 DIAGNOSIS — M129 Arthropathy, unspecified: Secondary | ICD-10-CM | POA: Diagnosis present

## 2013-09-07 DIAGNOSIS — I509 Heart failure, unspecified: Secondary | ICD-10-CM

## 2013-09-07 DIAGNOSIS — I5032 Chronic diastolic (congestive) heart failure: Secondary | ICD-10-CM

## 2013-09-07 DIAGNOSIS — R109 Unspecified abdominal pain: Secondary | ICD-10-CM

## 2013-09-07 DIAGNOSIS — I10 Essential (primary) hypertension: Secondary | ICD-10-CM

## 2013-09-07 DIAGNOSIS — R1013 Epigastric pain: Secondary | ICD-10-CM

## 2013-09-07 DIAGNOSIS — D329 Benign neoplasm of meninges, unspecified: Secondary | ICD-10-CM

## 2013-09-07 DIAGNOSIS — E538 Deficiency of other specified B group vitamins: Secondary | ICD-10-CM

## 2013-09-07 DIAGNOSIS — S72002A Fracture of unspecified part of neck of left femur, initial encounter for closed fracture: Secondary | ICD-10-CM

## 2013-09-07 DIAGNOSIS — N390 Urinary tract infection, site not specified: Secondary | ICD-10-CM

## 2013-09-07 DIAGNOSIS — R51 Headache: Secondary | ICD-10-CM

## 2013-09-07 DIAGNOSIS — R600 Localized edema: Secondary | ICD-10-CM

## 2013-09-07 DIAGNOSIS — R609 Edema, unspecified: Secondary | ICD-10-CM

## 2013-09-07 DIAGNOSIS — I4891 Unspecified atrial fibrillation: Secondary | ICD-10-CM

## 2013-09-07 DIAGNOSIS — E860 Dehydration: Secondary | ICD-10-CM

## 2013-09-07 DIAGNOSIS — R079 Chest pain, unspecified: Secondary | ICD-10-CM

## 2013-09-07 DIAGNOSIS — D62 Acute posthemorrhagic anemia: Secondary | ICD-10-CM | POA: Diagnosis not present

## 2013-09-07 DIAGNOSIS — R001 Bradycardia, unspecified: Secondary | ICD-10-CM

## 2013-09-07 DIAGNOSIS — G8929 Other chronic pain: Secondary | ICD-10-CM

## 2013-09-07 DIAGNOSIS — R1310 Dysphagia, unspecified: Secondary | ICD-10-CM

## 2013-09-07 DIAGNOSIS — E876 Hypokalemia: Secondary | ICD-10-CM | POA: Diagnosis not present

## 2013-09-07 DIAGNOSIS — Z66 Do not resuscitate: Secondary | ICD-10-CM | POA: Diagnosis present

## 2013-09-07 DIAGNOSIS — S72143A Displaced intertrochanteric fracture of unspecified femur, initial encounter for closed fracture: Principal | ICD-10-CM

## 2013-09-07 DIAGNOSIS — K5710 Diverticulosis of small intestine without perforation or abscess without bleeding: Secondary | ICD-10-CM | POA: Diagnosis present

## 2013-09-07 DIAGNOSIS — M549 Dorsalgia, unspecified: Secondary | ICD-10-CM

## 2013-09-07 LAB — CBC WITH DIFFERENTIAL/PLATELET
Basophils Absolute: 0 10*3/uL (ref 0.0–0.1)
Basophils Relative: 0 % (ref 0–1)
EOS PCT: 0 % (ref 0–5)
Eosinophils Absolute: 0 10*3/uL (ref 0.0–0.7)
HEMATOCRIT: 33 % — AB (ref 36.0–46.0)
Hemoglobin: 10.7 g/dL — ABNORMAL LOW (ref 12.0–15.0)
LYMPHS ABS: 1.1 10*3/uL (ref 0.7–4.0)
LYMPHS PCT: 9 % — AB (ref 12–46)
MCH: 28 pg (ref 26.0–34.0)
MCHC: 32.4 g/dL (ref 30.0–36.0)
MCV: 86.4 fL (ref 78.0–100.0)
MONO ABS: 0.6 10*3/uL (ref 0.1–1.0)
MONOS PCT: 5 % (ref 3–12)
Neutro Abs: 10.2 10*3/uL — ABNORMAL HIGH (ref 1.7–7.7)
Neutrophils Relative %: 86 % — ABNORMAL HIGH (ref 43–77)
Platelets: 284 10*3/uL (ref 150–400)
RBC: 3.82 MIL/uL — AB (ref 3.87–5.11)
RDW: 16.9 % — ABNORMAL HIGH (ref 11.5–15.5)
WBC: 11.9 10*3/uL — AB (ref 4.0–10.5)

## 2013-09-07 LAB — PROTIME-INR
INR: 1.02 (ref 0.00–1.49)
Prothrombin Time: 13.2 seconds (ref 11.6–15.2)

## 2013-09-07 LAB — BASIC METABOLIC PANEL
BUN: 16 mg/dL (ref 6–23)
CHLORIDE: 107 meq/L (ref 96–112)
CO2: 24 meq/L (ref 19–32)
CREATININE: 0.82 mg/dL (ref 0.50–1.10)
Calcium: 10.4 mg/dL (ref 8.4–10.5)
GFR calc Af Amer: 72 mL/min — ABNORMAL LOW (ref >90)
GFR calc non Af Amer: 63 mL/min — ABNORMAL LOW (ref >90)
GLUCOSE: 181 mg/dL — AB (ref 70–99)
Potassium: 4.1 mEq/L (ref 3.7–5.3)
SODIUM: 142 meq/L (ref 137–147)

## 2013-09-07 MED ORDER — ENALAPRIL MALEATE 5 MG PO TABS
10.0000 mg | ORAL_TABLET | Freq: Two times a day (BID) | ORAL | Status: DC
  Administered 2013-09-07 – 2013-09-13 (×12): 10 mg via ORAL
  Filled 2013-09-07 (×14): qty 2

## 2013-09-07 MED ORDER — LORAZEPAM 0.5 MG PO TABS
0.2500 mg | ORAL_TABLET | Freq: Four times a day (QID) | ORAL | Status: DC | PRN
  Administered 2013-09-08 – 2013-09-10 (×6): 0.25 mg via ORAL
  Filled 2013-09-07 (×6): qty 1

## 2013-09-07 MED ORDER — AMLODIPINE BESYLATE 5 MG PO TABS
5.0000 mg | ORAL_TABLET | Freq: Every day | ORAL | Status: DC
  Administered 2013-09-08 – 2013-09-13 (×5): 5 mg via ORAL
  Filled 2013-09-07 (×6): qty 1

## 2013-09-07 MED ORDER — HYDROCERIN EX CREA
1.0000 "application " | TOPICAL_CREAM | CUTANEOUS | Status: DC | PRN
  Filled 2013-09-07: qty 113

## 2013-09-07 MED ORDER — CALCIUM CARBONATE ANTACID 500 MG PO CHEW
2.0000 | CHEWABLE_TABLET | Freq: Three times a day (TID) | ORAL | Status: DC | PRN
  Administered 2013-09-11 (×2): 400 mg via ORAL
  Filled 2013-09-07 (×3): qty 1

## 2013-09-07 MED ORDER — SODIUM CHLORIDE 0.9 % IV SOLN
1000.0000 mL | INTRAVENOUS | Status: DC
  Administered 2013-09-07: 1000 mL via INTRAVENOUS

## 2013-09-07 MED ORDER — HYDRALAZINE HCL 20 MG/ML IJ SOLN
5.0000 mg | Freq: Four times a day (QID) | INTRAMUSCULAR | Status: DC | PRN
  Administered 2013-09-09: 5 mg via INTRAVENOUS
  Filled 2013-09-07: qty 1

## 2013-09-07 MED ORDER — HEPARIN SODIUM (PORCINE) 5000 UNIT/ML IJ SOLN
5000.0000 [IU] | Freq: Three times a day (TID) | INTRAMUSCULAR | Status: DC
  Administered 2013-09-07 – 2013-09-08 (×4): 5000 [IU] via SUBCUTANEOUS
  Filled 2013-09-07 (×4): qty 1

## 2013-09-07 MED ORDER — PANTOPRAZOLE SODIUM 40 MG PO TBEC
40.0000 mg | DELAYED_RELEASE_TABLET | Freq: Every day | ORAL | Status: DC
  Administered 2013-09-07 – 2013-09-13 (×6): 40 mg via ORAL
  Filled 2013-09-07 (×7): qty 1

## 2013-09-07 MED ORDER — NITROGLYCERIN 0.4 MG SL SUBL: 0.4000 mg | SUBLINGUAL_TABLET | SUBLINGUAL | Status: DC | PRN

## 2013-09-07 MED ORDER — FENTANYL CITRATE 0.05 MG/ML IJ SOLN
25.0000 ug | INTRAMUSCULAR | Status: AC | PRN
  Administered 2013-09-07 (×2): 25 ug via INTRAVENOUS
  Filled 2013-09-07: qty 2

## 2013-09-07 MED ORDER — SERTRALINE HCL 50 MG PO TABS
25.0000 mg | ORAL_TABLET | Freq: Every day | ORAL | Status: DC
  Administered 2013-09-07 – 2013-09-13 (×7): 25 mg via ORAL
  Filled 2013-09-07 (×7): qty 1

## 2013-09-07 MED ORDER — DEXTROSE-NACL 5-0.45 % IV SOLN
INTRAVENOUS | Status: DC
Start: 2013-09-07 — End: 2013-09-07

## 2013-09-07 MED ORDER — SENNOSIDES-DOCUSATE SODIUM 8.6-50 MG PO TABS
1.0000 | ORAL_TABLET | Freq: Two times a day (BID) | ORAL | Status: DC
  Administered 2013-09-07 – 2013-09-13 (×12): 1 via ORAL
  Filled 2013-09-07 (×13): qty 1

## 2013-09-07 MED ORDER — SODIUM CHLORIDE 0.9 % IV SOLN
1000.0000 mL | INTRAVENOUS | Status: DC
  Administered 2013-09-07 – 2013-09-08 (×2): 1000 mL via INTRAVENOUS

## 2013-09-07 MED ORDER — OXYCODONE-ACETAMINOPHEN 5-325 MG PO TABS
1.0000 | ORAL_TABLET | ORAL | Status: DC | PRN
Start: 2013-09-07 — End: 2013-09-14
  Administered 2013-09-07 – 2013-09-14 (×23): 1 via ORAL
  Filled 2013-09-07 (×24): qty 1

## 2013-09-07 NOTE — ED Notes (Signed)
Patient transported to X-ray 

## 2013-09-07 NOTE — Consult Note (Signed)
Reason for Consult:Fracture of the left hip Referring Physician: ER  Madison Hickman is an 80 y.o. female.  HPI: She fell at [facility] home today.  She has been a resident there for some time.  She has pain of the left hip.  X-rays show intertrochanteric fracture.  She has no other injury.  I have told her of the findings.  She will need surgical fixation.  I have gone over risks and imponderables including infection, embolus, nerve injury, possible blood transfusion, need for physical therapy and anesthesia risks.  She asked appropriate questions.  She appears to understand.  Past Medical History  Diagnosis Date  . Back pain, chronic   . Headache(784.0)   . Hypertension   . Arthritis   . Meningioma 04/15/2012  . IDA (iron deficiency anemia)   . Diastolic dysfunction Oct 6294    EF 50-55%   . Hypercalcemia   . Bladder prolapse, female, acquired   . LVH (left ventricular hypertrophy) 03/22/2013    Ejection fraction 50-55%.  . Chronic diastolic heart failure     Past Surgical History  Procedure Laterality Date  . Abdominal hysterectomy    . Arm surgery    . Esophagogastroduodenoscopy  04/18/2012    TML:YYTKP hiatal hernia/NO OBVIOUS SOURCE FOR PROFOUND ANEMIA IDENTIFIED.MODERATE GASTRITIS CONTRIBUTES TO ANEMIA/no definite stricture identified, empiric dilation performed. + H.PYLORI    No family history on file.  Social History:  reports that she has never smoked. She does not have any smokeless tobacco history on file. She reports that she does not drink alcohol or use illicit drugs.  Allergies: No Known Allergies  Medications: I have reviewed the patient's current medications.  Results for orders placed during the hospital encounter of 09/07/13 (from the past 48 hour(s))  BASIC METABOLIC PANEL     Status: Abnormal   Collection Time    09/07/13  2:48 PM      Result Value Ref Range   Sodium 142  137 - 147 mEq/L   Potassium 4.1  3.7 - 5.3 mEq/L   Chloride 107  96 - 112  mEq/L   CO2 24  19 - 32 mEq/L   Glucose, Bld 181 (*) 70 - 99 mg/dL   BUN 16  6 - 23 mg/dL   Creatinine, Ser 0.82  0.50 - 1.10 mg/dL   Calcium 10.4  8.4 - 10.5 mg/dL   GFR calc non Af Amer 63 (*) >90 mL/min   GFR calc Af Amer 72 (*) >90 mL/min   Comment: (NOTE)     The eGFR has been calculated using the CKD EPI equation.     This calculation has not been validated in all clinical situations.     eGFR's persistently <90 mL/min signify possible Chronic Kidney     Disease.  CBC WITH DIFFERENTIAL     Status: Abnormal   Collection Time    09/07/13  2:48 PM      Result Value Ref Range   WBC 11.9 (*) 4.0 - 10.5 K/uL   RBC 3.82 (*) 3.87 - 5.11 MIL/uL   Hemoglobin 10.7 (*) 12.0 - 15.0 g/dL   HCT 33.0 (*) 36.0 - 46.0 %   MCV 86.4  78.0 - 100.0 fL   MCH 28.0  26.0 - 34.0 pg   MCHC 32.4  30.0 - 36.0 g/dL   RDW 16.9 (*) 11.5 - 15.5 %   Platelets 284  150 - 400 K/uL   Neutrophils Relative % 86 (*) 43 - 77 %  Neutro Abs 10.2 (*) 1.7 - 7.7 K/uL   Lymphocytes Relative 9 (*) 12 - 46 %   Lymphs Abs 1.1  0.7 - 4.0 K/uL   Monocytes Relative 5  3 - 12 %   Monocytes Absolute 0.6  0.1 - 1.0 K/uL   Eosinophils Relative 0  0 - 5 %   Eosinophils Absolute 0.0  0.0 - 0.7 K/uL   Basophils Relative 0  0 - 1 %   Basophils Absolute 0.0  0.0 - 0.1 K/uL  PROTIME-INR     Status: None   Collection Time    09/07/13  2:48 PM      Result Value Ref Range   Prothrombin Time 13.2  11.6 - 15.2 seconds   INR 1.02  0.00 - 1.49  TYPE AND SCREEN     Status: None   Collection Time    09/07/13  2:48 PM      Result Value Ref Range   ABO/RH(D) A POS     Antibody Screen NEG     Sample Expiration 09/10/2013     Unit Number [id number]     Blood Component Type RED CELLS,LR     Unit division 00     Status of Unit ALLOCATED     Transfusion Status OK TO TRANSFUSE     Crossmatch Result Compatible     Unit Number [id number]     Blood Component Type RED CELLS,LR     Unit division 00     Status of Unit ALLOCATED      Transfusion Status OK TO TRANSFUSE     Crossmatch Result Compatible      Dg Chest 1 View  09/07/2013   CLINICAL DATA:  Fall.  Hip pain  EXAM: CHEST - 1 VIEW  COMPARISON:  03/19/2013  FINDINGS: Cardiac enlargement.  Thoracic aortic aneurysm with tortuosity.  Negative for heart failure. Negative for infiltrate effusion or mass.  IMPRESSION: Cardiac enlargement.  No acute abnormality.   Electronically Signed   By: Franchot Gallo M.D.   On: 09/07/2013 15:10   Dg Hip Complete Left  09/07/2013   CLINICAL DATA:  Fall  EXAM: LEFT HIP - COMPLETE 2+ VIEW  COMPARISON:  None.  FINDINGS: Intertrochanteric fracture of the left femur with angulation. Left hip joint is satisfactory.  Pessary is present in the vagina.  IMPRESSION: Angulated intertrochanteric fracture left femur   Electronically Signed   By: Franchot Gallo M.D.   On: 09/07/2013 15:13    Review of Systems  Cardiovascular:       History of hypertension and bradycardia.  Gastrointestinal:       History of failure to thrive, very thin.  Musculoskeletal: Positive for back pain (long history of chronic lower back pain.) and falls (fell hurt left hip, cannot stand).  Endo/Heme/Allergies:       History of anemia, Iron deficiency, B12 deficiency.   Blood pressure 132/72, pulse 80, temperature 98.8 F (37.1 C), temperature source Oral, resp. rate 16, height 5' 6"  (1.676 m), weight 40.824 kg (90 lb), SpO2 95.00%. Physical Exam  Constitutional: She is oriented to person, place, and time. She appears well-developed.  Very thin  HENT:  Head: Normocephalic and atraumatic.  Eyes: Conjunctivae and EOM are normal. Pupils are equal, round, and reactive to light.  Neck: Normal range of motion. Neck supple.  Cardiovascular: Normal rate, regular rhythm and intact distal pulses.   Respiratory: Effort normal.  GI: Soft.  Musculoskeletal: She exhibits tenderness (Pain left hip with shortening,  external rotation.  NV intact.).  Neurological: She is alert and  oriented to person, place, and time. She has normal reflexes.  Skin: Skin is warm.  Psychiatric: She has a normal mood and affect. Her behavior is normal. Judgment and thought content normal.    Assessment/Plan: Intertrochanteric fracture of the left hip that will need surgery.  Await medical clearance.  To be admitted to hospitalist.  Sanjuana Kava 09/07/2013, 4:30 PM

## 2013-09-07 NOTE — ED Provider Notes (Signed)
CSN: [id number]     Arrival date & time 09/07/13  1424 History  This chart was scribed for Madison Alberts, MD,  by Stacy Gardner, ED Scribe. The patient was seen in room A315/A315-01 and the patient's care was started at 2:29 PM.    First MD Initiated Contact with Patient 09/07/13 1426     Chief Complaint  Patient presents with  . Fall  . Hip Pain     (Consider location/radiation/quality/duration/timing/severity/associated sxs/prior Treatment) The history is provided by the patient, the nursing home and medical records. No language interpreter was used.   HPI Comments: Madison Hickman is a 80 y.o. female patient at [facility] who arrives by EMS presents to the Emergency Department complaining of fall and hip pain that occurred today. Pt denies head injury and syncope. Pt fell onto her left side injuring her left hip. Pt is AO x 4. Pt has shortening and rotation to her left hip that is painful with any kind of movement. Pt took a percocet one hour ago. Pt is weraing a Lidoderm patch and a Fentanel 12.5 microgram patch.  Denies any other pain.  Past Medical History  Diagnosis Date  . Back pain, chronic   . Headache(784.0)   . Hypertension   . Arthritis   . Meningioma 04/15/2012  . IDA (iron deficiency anemia)   . Diastolic dysfunction Oct 5009    EF 50-55%   . Hypercalcemia   . Bladder prolapse, female, acquired   . LVH (left ventricular hypertrophy) 03/22/2013    Ejection fraction 50-55%.  . Chronic diastolic heart failure    Past Surgical History  Procedure Laterality Date  . Abdominal hysterectomy    . Arm surgery    . Esophagogastroduodenoscopy  04/18/2012    [facility]:WEXHB hiatal hernia/NO OBVIOUS SOURCE FOR PROFOUND ANEMIA IDENTIFIED.MODERATE GASTRITIS CONTRIBUTES TO ANEMIA/no definite stricture identified, empiric dilation performed. + H.PYLORI   History reviewed. No pertinent family history. History  Substance Use Topics  . Smoking status: Never Smoker   . Smokeless tobacco:  Not on file  . Alcohol Use: No   OB History   Grav Para Term Preterm Abortions TAB SAB Ect Mult Living                 Review of Systems  Musculoskeletal: Positive for arthralgias and myalgias.  Neurological: Negative for syncope.  Psychiatric/Behavioral: Negative for confusion.  All other systems reviewed and are negative.      Allergies  Review of patient's allergies indicates no known allergies.  Home Medications   No current outpatient prescriptions on file. BP 139/75  Pulse 83  Temp(Src) 98.8 F (37.1 C) (Oral)  Resp 17  Ht 5\' 6"  (1.676 m)  Wt 90 lb (40.824 kg)  BMI 14.53 kg/m2  SpO2 95% Physical Exam  Nursing note and vitals reviewed. Constitutional: She is oriented to person, place, and time. She appears well-developed. No distress.  HENT:  Head: Normocephalic and atraumatic.  Mouth/Throat: Oropharynx is clear and moist. No oropharyngeal exudate.  Neck: Normal range of motion.  No step off or deformities    Cardiovascular: Normal rate, regular rhythm, normal heart sounds and intact distal pulses.  Exam reveals no gallop and no friction rub.   No murmur heard. Pulmonary/Chest: Breath sounds normal. She has no wheezes. She has no rales.  Abdominal: Soft. There is no tenderness. There is no rebound and no guarding.  Musculoskeletal: She exhibits tenderness.  No tenderness to the right upper extremity and left upper extremity No  tenderness to the right lower extremity  Tenderness and shortening of the left lower extremity  neurovascularly intact ROM limited secondary to pain  Neurological: She is alert and oriented to person, place, and time. No cranial nerve deficit. She exhibits normal muscle tone. Coordination normal.  Skin: Skin is warm and dry. She is not diaphoretic. No erythema.  Psychiatric: She has a normal mood and affect. Her behavior is normal.    ED Course  Procedures (including critical care time) DIAGNOSTIC STUDIES: Oxygen Saturation is  95% on room air, normal by my interpretation.    COORDINATION OF CARE:  2:34 PM Discussed course of care with pt . Pt understands and agrees.   Labs Review Labs Reviewed  BASIC METABOLIC PANEL - Abnormal; Notable for the following:    Glucose, Bld 181 (*)    GFR calc non Af Amer 63 (*)    GFR calc Af Amer 72 (*)    All other components within normal limits  CBC WITH DIFFERENTIAL - Abnormal; Notable for the following:    WBC 11.9 (*)    RBC 3.82 (*)    Hemoglobin 10.7 (*)    HCT 33.0 (*)    RDW 16.9 (*)    Neutrophils Relative % 86 (*)    Neutro Abs 10.2 (*)    Lymphocytes Relative 9 (*)    All other components within normal limits  PROTIME-INR  COMPREHENSIVE METABOLIC PANEL  CBC  TYPE AND SCREEN   Imaging Review Dg Chest 1 View  09/07/2013   CLINICAL DATA:  Fall.  Hip pain  EXAM: CHEST - 1 VIEW  COMPARISON:  03/19/2013  FINDINGS: Cardiac enlargement.  Thoracic aortic aneurysm with tortuosity.  Negative for heart failure. Negative for infiltrate effusion or mass.  IMPRESSION: Cardiac enlargement.  No acute abnormality.   Electronically Signed   By: Franchot Gallo M.D.   On: 09/07/2013 15:10   Dg Hip Complete Left  09/07/2013   CLINICAL DATA:  Fall  EXAM: LEFT HIP - COMPLETE 2+ VIEW  COMPARISON:  None.  FINDINGS: Intertrochanteric fracture of the left femur with angulation. Left hip joint is satisfactory.  Pessary is present in the vagina.  IMPRESSION: Angulated intertrochanteric fracture left femur   Electronically Signed   By: Franchot Gallo M.D.   On: 09/07/2013 15:13     EKG Interpretation None      MDM   Final diagnoses:  Hip fracture, intertrochanteric    Patient with mechanical fall. Left intertrochanteric hip fracture. Admitted to internal medicine with orthopedic consult.   I personally performed the services described in this documentation, which was scribed in my presence. The recorded information has been reviewed and is accurate.      Jasper Riling.  Alvino Chapel, MD 09/07/13 2124

## 2013-09-07 NOTE — H&P (Signed)
Madison Hickman MRN: [id number] DOB/AGE: 1924/05/16 80 y.o. Primary Care Physician:KIM, Jeneen Rinks, MD Admit date: 09/07/2013 Chief Complaint: Left hip pain. HPI: This pleasant 80 year old lady presents to the hospital having suffered a fall and she has now fractured her left hip. She lives at the nursing home and fell today. She denies any loss of consciousness. There has been no head injury. She denies any chest pain, dyspnea or palpitations at the present time nor did she have any today.  Past Medical History  Diagnosis Date  . Back pain, chronic   . Headache(784.0)   . Hypertension   . Arthritis   . Meningioma 04/15/2012  . IDA (iron deficiency anemia)   . Diastolic dysfunction Oct 6606    EF 50-55%   . Hypercalcemia   . Bladder prolapse, female, acquired   . LVH (left ventricular hypertrophy) 03/22/2013    Ejection fraction 50-55%.  . Chronic diastolic heart failure    Past Surgical History  Procedure Laterality Date  . Abdominal hysterectomy    . Arm surgery    . Esophagogastroduodenoscopy  04/18/2012    [facility]:SWFUX hiatal hernia/NO OBVIOUS SOURCE FOR PROFOUND ANEMIA IDENTIFIED.MODERATE GASTRITIS CONTRIBUTES TO ANEMIA/no definite stricture identified, empiric dilation performed. + H.PYLORI        History reviewed. No pertinent family history.  Social History:  reports that she has never smoked. She does not have any smokeless tobacco history on file. She reports that she does not drink alcohol or use illicit drugs.   Allergies: No Known Allergies  Medications Prior to Admission  Medication Sig Dispense Refill  . amLODipine (NORVASC) 5 MG tablet Take 1 tablet (5 mg total) by mouth daily. For blood pressure  30 tablet  3  . aspirin EC 81 MG tablet Take 81 mg by mouth daily.      . calcium carbonate (TUMS - DOSED IN MG ELEMENTAL CALCIUM) 500 MG chewable tablet Chew 2 tablets by mouth every 8 (eight) hours as needed for indigestion or heartburn.      . docusate sodium (COLACE)  100 MG capsule Take 100 mg by mouth daily as needed for constipation.      . enalapril (VASOTEC) 10 MG tablet Take 1 tablet (10 mg total) by mouth 2 (two) times daily.  60 tablet  3  . fentaNYL (DURAGESIC - DOSED MCG/HR) 12 MCG/HR Place 12.5 mcg onto the skin every 3 (three) days.      Marland Kitchen lidocaine (LIDODERM) 5 % Place 1 patch onto the skin daily. Remove & Discard patch within 12 hours or as directed by MD      . LORazepam (ATIVAN) 0.5 MG tablet Take 0.25 mg by mouth every 6 (six) hours as needed for anxiety.      . nitroGLYCERIN (NITROSTAT) 0.4 MG SL tablet Place 1 tablet (0.4 mg total) under the tongue every 5 (five) minutes as needed for chest pain.      Marland Kitchen oxyCODONE-acetaminophen (PERCOCET/ROXICET) 5-325 MG per tablet Take 1 tablet by mouth every 4 (four) hours as needed for moderate pain or severe pain.      . pantoprazole (PROTONIX) 40 MG tablet Take 1 tablet (40 mg total) by mouth daily. 30 minutes before breakfast for GERD  30 tablet  6  . sennosides-docusate sodium (SENOKOT-S) 8.6-50 MG tablet Take 1 tablet by mouth 2 (two) times daily.      . sertraline (ZOLOFT) 25 MG tablet Take 25 mg by mouth at bedtime.      . Skin Protectants, Misc. (  EUCERIN) cream Apply 1 application topically as needed for dry skin.           NKN:[id number] from the symptoms mentioned above,there are no other symptoms referable to all systems reviewed.  Physical Exam: Blood pressure 171/87, pulse 74, temperature 98.1 F (36.7 C), temperature source Oral, resp. rate 18, height 5\' 6"  (1.676 m), weight 43.5 kg (95 lb 14.4 oz), SpO2 96.00%. She looks slightly frail but is alert and orientated and looks appropriate for age. Heart sounds are present and in sinus rhythm with occasional PVCs. Jugular venous pressure is not raised. Lung fields are entirely clear without any evidence of crackles, wheezing or bronchial breathing. Abdomen is soft nontender. She appears to be alert and orientated without any obvious focal  neurological signs. The left leg is externally rotated and shortened, consistent with left hip fracture.    Recent Labs  09/07/13 1448  WBC 11.9*  NEUTROABS 10.2*  HGB 10.7*  HCT 33.0*  MCV 86.4  PLT 284    Recent Labs  09/07/13 1448  NA 142  K 4.1  CL 107  CO2 24  GLUCOSE 181*  BUN 16  CREATININE 0.82  CALCIUM 10.4         Dg Chest 1 View  09/07/2013   CLINICAL DATA:  Fall.  Hip pain  EXAM: CHEST - 1 VIEW  COMPARISON:  03/19/2013  FINDINGS: Cardiac enlargement.  Thoracic aortic aneurysm with tortuosity.  Negative for heart failure. Negative for infiltrate effusion or mass.  IMPRESSION: Cardiac enlargement.  No acute abnormality.   Electronically Signed   By: Franchot Gallo M.D.   On: 09/07/2013 15:10   Dg Hip Complete Left  09/07/2013   CLINICAL DATA:  Fall  EXAM: LEFT HIP - COMPLETE 2+ VIEW  COMPARISON:  None.  FINDINGS: Intertrochanteric fracture of the left femur with angulation. Left hip joint is satisfactory.  Pessary is present in the vagina.  IMPRESSION: Angulated intertrochanteric fracture left femur   Electronically Signed   By: Franchot Gallo M.D.   On: 09/07/2013 15:13   Impression: 1. Left hip fracture, appears to be accidental. 2. History of chronic diastolic congestive heart failure, currently compensated. 3. Hypertension, blood pressure somewhat elevated at the present time, likely due to pain. 4. Chronic back pain. 5. History of paroxysmal atrial fibrillation, currently in sinus rhythm. 6. History of meningioma in the past, not currently a problem.     Plan: 1. Admit to medical floor. 2. Analgesia as required orally for her left hip pain. 3. Intravenous hydralazine when necessary for blood pressure control as required. 4. Orthopedic consultation is appreciated. From my point of view, she is medically clear for surgery.  Other recommendations will depend on patient's hospital progress.      Moraine C   09/07/2013, 5:30 PM

## 2013-09-07 NOTE — ED Notes (Signed)
Per EMS pt from [facility] with c/o fall. Pain to left hip, shortening and rotation noted. Pt has fentanyl patch in place. Percocet 1 hour prior to arrival. Alert and oriented x 4.

## 2013-09-08 DIAGNOSIS — I10 Essential (primary) hypertension: Secondary | ICD-10-CM

## 2013-09-08 DIAGNOSIS — D509 Iron deficiency anemia, unspecified: Secondary | ICD-10-CM

## 2013-09-08 LAB — CBC WITH DIFFERENTIAL/PLATELET
BASOS PCT: 0 % (ref 0–1)
Basophils Absolute: 0 10*3/uL (ref 0.0–0.1)
Eosinophils Absolute: 0 10*3/uL (ref 0.0–0.7)
Eosinophils Relative: 1 % (ref 0–5)
HCT: 32.9 % — ABNORMAL LOW (ref 36.0–46.0)
HEMOGLOBIN: 11 g/dL — AB (ref 12.0–15.0)
LYMPHS ABS: 1 10*3/uL (ref 0.7–4.0)
LYMPHS PCT: 12 % (ref 12–46)
MCH: 28.9 pg (ref 26.0–34.0)
MCHC: 33.4 g/dL (ref 30.0–36.0)
MCV: 86.4 fL (ref 78.0–100.0)
Monocytes Absolute: 1.1 10*3/uL — ABNORMAL HIGH (ref 0.1–1.0)
Monocytes Relative: 14 % — ABNORMAL HIGH (ref 3–12)
NEUTROS PCT: 74 % (ref 43–77)
Neutro Abs: 5.9 10*3/uL (ref 1.7–7.7)
Platelets: 215 10*3/uL (ref 150–400)
RBC: 3.81 MIL/uL — AB (ref 3.87–5.11)
RDW: 16.4 % — ABNORMAL HIGH (ref 11.5–15.5)
WBC: 8 10*3/uL (ref 4.0–10.5)

## 2013-09-08 LAB — PREPARE RBC (CROSSMATCH)

## 2013-09-08 LAB — CBC
HCT: 29.8 % — ABNORMAL LOW (ref 36.0–46.0)
HEMOGLOBIN: 9.7 g/dL — AB (ref 12.0–15.0)
MCH: 28.2 pg (ref 26.0–34.0)
MCHC: 32.6 g/dL (ref 30.0–36.0)
MCV: 86.6 fL (ref 78.0–100.0)
Platelets: 251 10*3/uL (ref 150–400)
RBC: 3.44 MIL/uL — ABNORMAL LOW (ref 3.87–5.11)
RDW: 17 % — ABNORMAL HIGH (ref 11.5–15.5)
WBC: 9.1 10*3/uL (ref 4.0–10.5)

## 2013-09-08 LAB — COMPREHENSIVE METABOLIC PANEL
ALBUMIN: 3 g/dL — AB (ref 3.5–5.2)
ALT: 8 U/L (ref 0–35)
AST: 15 U/L (ref 0–37)
Alkaline Phosphatase: 51 U/L (ref 39–117)
BILIRUBIN TOTAL: 0.5 mg/dL (ref 0.3–1.2)
BUN: 12 mg/dL (ref 6–23)
CHLORIDE: 110 meq/L (ref 96–112)
CO2: 23 mEq/L (ref 19–32)
CREATININE: 0.6 mg/dL (ref 0.50–1.10)
Calcium: 9.7 mg/dL (ref 8.4–10.5)
GFR calc Af Amer: >90 mL/min (ref >90)
GFR calc non Af Amer: 80 mL/min — ABNORMAL LOW (ref >90)
Glucose, Bld: 114 mg/dL — ABNORMAL HIGH (ref 70–99)
Potassium: 3.7 mEq/L (ref 3.7–5.3)
Sodium: 143 mEq/L (ref 137–147)
Total Protein: 5.7 g/dL — ABNORMAL LOW (ref 6.0–8.3)

## 2013-09-08 LAB — SURGICAL PCR SCREEN
MRSA, PCR: NEGATIVE
STAPHYLOCOCCUS AUREUS: NEGATIVE

## 2013-09-08 MED ORDER — POVIDONE-IODINE 10 % EX SOLN
Freq: Once | CUTANEOUS | Status: AC
  Administered 2013-09-08: 22:00:00 via TOPICAL
  Filled 2013-09-08: qty 118

## 2013-09-08 MED ORDER — POTASSIUM CHLORIDE IN NACL 20-0.9 MEQ/L-% IV SOLN
INTRAVENOUS | Status: DC
  Administered 2013-09-08: 50 mL/h via INTRAVENOUS
  Administered 2013-09-10: 17:00:00 via INTRAVENOUS

## 2013-09-08 MED ORDER — CEFAZOLIN SODIUM-DEXTROSE 2-3 GM-% IV SOLR
2.0000 g | Freq: Once | INTRAVENOUS | Status: AC
  Administered 2013-09-09: 2 g via INTRAVENOUS
  Filled 2013-09-08 (×2): qty 50

## 2013-09-08 NOTE — Progress Notes (Signed)
[facility] PROGRESS NOTE  Madison Hickman SWN:[id number] DOB: 1924/04/08 DOA: 09/07/2013 PCP: Jani Gravel, MD    Code Status: DO NOT RESUSCITATE Family Communication: Family not available Disposition Plan: Likely discharge back to skilled nursing facility when clinically appropriate   Consultants:  Orthopedic, Dr. Luna Glasgow  Procedures:  Hip surgery pending  Antibiotics:  Cefazolin IV x1, pending  HPI/Subjective: The patient complains of left leg "soreness". She denies headache, shortness of breath, or abdominal pain.  Objective: Filed Vitals:   09/08/13 0930  BP: 155/89  Pulse:   Temp:   Resp:    temperature 98.4. Pulse 82. Respiratory rate 18. Blood pressure 155/89. Oxygen saturation 96%.  Intake/Output Summary (Last 24 hours) at 09/08/13 1112 Last data filed at 09/08/13 0930  Gross per 24 hour  Intake    120 ml  Output    350 ml  Net   -230 ml   Filed Weights   09/07/13 1428 09/07/13 1706  Weight: 40.824 kg (90 lb) 43.5 kg (95 lb 14.4 oz)    Exam:   General:  Pleasant alert, but mildly confused 80 year old woman in no acute distress.  Cardiovascular: S1, S2, with a soft systolic murmur.  Respiratory: Clear anteriorly with decreased breath sounds in the bases.  Abdomen: Soft, nontender, nondistended, positive bowel sounds.  Musculoskeletal: Left lower extremity with mild edema and tenderness at the left hip joint; left leg is externally rotated and shortened and tender to palpation. No edema of the right lower extremity.  Neurologic: She is alert and slightly confused. Cranial nerves II through XII are intact.   Data Reviewed: Basic Metabolic Panel:  Recent Labs Lab 09/07/13 1448 09/08/13 0525  NA 142 143  K 4.1 3.7  CL 107 110  CO2 24 23  GLUCOSE 181* 114*  BUN 16 12  CREATININE 0.82 0.60  CALCIUM 10.4 9.7   Liver Function Tests:  Recent Labs Lab 09/08/13 0525  AST 15  ALT 8  ALKPHOS 51  BILITOT 0.5  PROT 5.7*  ALBUMIN 3.0*    No results found for this basename: LIPASE, AMYLASE,  in the last 168 hours No results found for this basename: AMMONIA,  in the last 168 hours CBC:  Recent Labs Lab 09/07/13 1448 09/08/13 0525  WBC 11.9* 9.1  NEUTROABS 10.2*  --   HGB 10.7* 9.7*  HCT 33.0* 29.8*  MCV 86.4 86.6  PLT 284 251   Cardiac Enzymes: No results found for this basename: CKTOTAL, CKMB, CKMBINDEX, TROPONINI,  in the last 168 hours BNP (last 3 results)  Recent Labs  11/04/12 1245 11/10/12 1726  PROBNP 1050.0* 1130.0*   CBG: No results found for this basename: GLUCAP,  in the last 168 hours  No results found for this or any previous visit (from the past 240 hour(s)).   Studies: Dg Chest 1 View  09/07/2013   CLINICAL DATA:  Fall.  Hip pain  EXAM: CHEST - 1 VIEW  COMPARISON:  03/19/2013  FINDINGS: Cardiac enlargement.  Thoracic aortic aneurysm with tortuosity.  Negative for heart failure. Negative for infiltrate effusion or mass.  IMPRESSION: Cardiac enlargement.  No acute abnormality.   Electronically Signed   By: Franchot Gallo M.D.   On: 09/07/2013 15:10   Dg Hip Complete Left  09/07/2013   CLINICAL DATA:  Fall  EXAM: LEFT HIP - COMPLETE 2+ VIEW  COMPARISON:  None.  FINDINGS: Intertrochanteric fracture of the left femur with angulation. Left hip joint is satisfactory.  Pessary is present in the  vagina.  IMPRESSION: Angulated intertrochanteric fracture left femur   Electronically Signed   By: Franchot Gallo M.D.   On: 09/07/2013 15:13    Scheduled Meds: . amLODipine  5 mg Oral Daily  . [START ON 09/09/2013]  ceFAZolin (ANCEF) IV  2 g Intravenous Once  . enalapril  10 mg Oral BID  . heparin  5,000 Units Subcutaneous 3 times per day  . pantoprazole  40 mg Oral Daily  . povidone-iodine   Topical Once  . senna-docusate  1 tablet Oral BID  . sertraline  25 mg Oral QHS   Continuous Infusions: . sodium chloride 1,000 mL (09/08/13 0458)    Assessment:  Principal Problem:   Hip fracture,  intertrochanteric Active Problems:   Chronic diastolic congestive heart failure   Microcytic anemia   Chronic back pain   Essential hypertension, benign  1. Acute left hip fracture, intertrochanteric, secondary to a fall at the skilled nursing facility. Dr. Luna Glasgow has evaluated the patient and planned surgical repair tomorrow. She is in some discomfort. Continue as needed oxycodone. Consider restarting the fentanyl patch.  Hypertension. She is mildly hypertensive, possibly secondary to pain. We'll continue amlodipine and enalapril.  Chronic low back pain, on chronic transdermal fentanyl. This is currently on hold.  Chronic diastolic heart failure, clinically compensated.  Known iron deficiency/microcytic anemia. Currently stable.  Mild confusion, likely underlying dementia. We'll order a vitamin B12 level and a TSH.     Plan: 1. Consider restarting fentanyl transdermal if oxycodone did not relieve her pain. 2. Order TSH and vitamin B12 for further evaluation. 3. Surgical repair of the left hip fracture per Dr. Luna Glasgow on 09/09/13.  Time spent: 35 minutes    [facility] Pager [phone]. If 7PM-7AM, please contact night-coverage at [email or web address], password [facility] 09/08/2013, 11:12 AM  LOS: 1 day

## 2013-09-08 NOTE — Progress Notes (Signed)
UR chart review completed.  

## 2013-09-08 NOTE — Progress Notes (Signed)
Subjective: My hip is sore   Objective: Vital signs in last 24 hours: Temp:  [97.5 F (36.4 C)-98.8 F (37.1 C)] 98.4 F (36.9 C) (03/06 0436) Pulse Rate:  [45-83] 82 (03/06 0436) Resp:  [16-18] 18 (03/06 0436) BP: (132-171)/(72-87) 171/84 mmHg (03/06 0436) SpO2:  [95 %-97 %] 96 % (03/06 0436) Weight:  [40.824 kg (90 lb)-43.5 kg (95 lb 14.4 oz)] 43.5 kg (95 lb 14.4 oz) (03/05 1706)  Intake/Output from previous day:   Intake/Output this shift:     Recent Labs  09/07/13 1448 09/08/13 0525  HGB 10.7* 9.7*    Recent Labs  09/07/13 1448 09/08/13 0525  WBC 11.9* 9.1  RBC 3.82* 3.44*  HCT 33.0* 29.8*  PLT 284 251    Recent Labs  09/07/13 1448 09/08/13 0525  NA 142 143  K 4.1 3.7  CL 107 110  CO2 24 23  BUN 16 12  CREATININE 0.82 0.60  GLUCOSE 181* 114*  CALCIUM 10.4 9.7    Recent Labs  09/07/13 1448  INR 1.02    Neurologically intact Neurovascular intact Sensation intact distally Intact pulses distally Dorsiflexion/Plantar flexion intact  Assessment/Plan: Plan surgical fixation tomorrow morning.     Madison Hickman 09/08/2013, 7:23 AM

## 2013-09-08 NOTE — Progress Notes (Signed)
10:45 AM Patient was mildly confused, calm and cooperative.  Complained of left hip pain, especially with movement.  Medication administered for pain and it helped.  Did not like breakfast. States that she eats cold cereal for breakfast and that was provided.  Took all medications without difficulty.  Bed in low position.  Call within reach.   Tracey Harries [id number]

## 2013-09-08 NOTE — Clinical Social Work Psychosocial (Signed)
Clinical Social Work Department BRIEF PSYCHOSOCIAL ASSESSMENT 09/08/2013  Patient:  Madison Hickman, Madison Hickman     Account Number:  [id number]     Admit date:  09/07/2013  Clinical Social Worker:  Norina Buzzard Intern  Date/Time:  09/08/2013 03:10 PM  Referred by:  Physician  Date Referred:  09/08/2013 Referred for  SNF Placement   Other Referral:   Interview type:  Patient Other interview type:    PSYCHOSOCIAL DATA Living Status:  FACILITY Admitted from facility:  [place] Level of care:  [place] Primary support name:  Freda Munro Primary support relationship to patient:  CHILD, ADULT Degree of support available:   Very supportive    CURRENT CONCERNS Current Concerns  Post-Acute Placement   Other Concerns:    SOCIAL WORK ASSESSMENT / PLAN Met with pt at bedside. Pt alert and oriented. Somewhat disoriented to time. Pt reports she has been a resident at [facility]. She explained she had a fall at the facility, but doesn't remember how it happened. Prior to her fall she was able to ambulate with a walker and sometimes used a wheelchair as well. Pt requires extensive assist with most ADLs, but is able to feed herself. She stated that her daughter and her son are her best support system. Her daughter, Freda Munro is able to come visit her almost every day at the facility. Son, Elta Guadeloupe tries to come as frequent as he can, but isn't able to make it very often. Pt reports her daughter is to come to the hospital today. Pt explains her plan at d/c is to return to [facility].    Spoke with Jackelyn Poling at [facility], who confirms patient has been a resident there for some time. She explained pt uses a walker and wheelchair at facility. Per Jackelyn Poling, the pt's daughter Freda Munro is very supportive. She reports pt is able to feed and groom herself, but requires assistance with other ADLs. Jackelyn Poling confirms pt is okay to return at d/c. Surgery scheduled for tomorrow 09/09/13.   Assessment/plan status:   Psychosocial Support/Ongoing Assessment of Needs Other assessment/ plan:   Information/referral to community resources:    PATIENT'S/FAMILY'S RESPONSE TO PLAN OF CARE: Pt and facility agreeable to return to [facility] when medically stable.  CSW will continue to follow.     Delfina Redwood BSW Intern    CSW met with pt's daughter, Freda Munro as pt was somewhat confused. Freda Munro feels that pt most likely was not using her walker as she has been requested to do when she fell. CSW asked her how she was feeling about surgery tomorrow and Freda Munro immediately teared up and reports fear that pt will not make it through surgery. CSW provided support to daughter.  Benay Pike, [place]

## 2013-09-09 ENCOUNTER — Inpatient Hospital Stay (HOSPITAL_COMMUNITY): Payer: Medicare Other

## 2013-09-09 ENCOUNTER — Encounter (HOSPITAL_COMMUNITY): Payer: Self-pay | Admitting: *Deleted

## 2013-09-09 ENCOUNTER — Encounter (HOSPITAL_COMMUNITY): Payer: Medicare Other | Admitting: Anesthesiology

## 2013-09-09 ENCOUNTER — Inpatient Hospital Stay (HOSPITAL_COMMUNITY): Payer: Medicare Other | Admitting: Anesthesiology

## 2013-09-09 ENCOUNTER — Encounter (HOSPITAL_COMMUNITY)
Admission: EM | Disposition: A | Discharge status: Skilled Nursing Facility | Payer: Self-pay | Source: Home / Self Care | Attending: Internal Medicine

## 2013-09-09 HISTORY — PX: ORIF HIP FRACTURE: SHX2125

## 2013-09-09 LAB — CBC
HCT: 34.5 % — ABNORMAL LOW (ref 36.0–46.0)
Hemoglobin: 11.4 g/dL — ABNORMAL LOW (ref 12.0–15.0)
MCH: 28.4 pg (ref 26.0–34.0)
MCHC: 33 g/dL (ref 30.0–36.0)
MCV: 86 fL (ref 78.0–100.0)
Platelets: 227 10*3/uL (ref 150–400)
RBC: 4.01 MIL/uL (ref 3.87–5.11)
RDW: 16.4 % — AB (ref 11.5–15.5)
WBC: 9.7 10*3/uL (ref 4.0–10.5)

## 2013-09-09 LAB — BASIC METABOLIC PANEL
BUN: 8 mg/dL (ref 6–23)
CHLORIDE: 108 meq/L (ref 96–112)
CO2: 23 meq/L (ref 19–32)
Calcium: 10.4 mg/dL (ref 8.4–10.5)
Creatinine, Ser: 0.54 mg/dL (ref 0.50–1.10)
GFR calc non Af Amer: 82 mL/min — ABNORMAL LOW (ref >90)
Glucose, Bld: 102 mg/dL — ABNORMAL HIGH (ref 70–99)
POTASSIUM: 3.7 meq/L (ref 3.7–5.3)
Sodium: 143 mEq/L (ref 137–147)

## 2013-09-09 LAB — TSH: TSH: 3.895 u[IU]/mL (ref 0.350–4.500)

## 2013-09-09 LAB — APTT
APTT: 34 s (ref 24–37)
aPTT: 34 seconds (ref 24–37)

## 2013-09-09 LAB — PROTIME-INR
INR: 1.04 (ref 0.00–1.49)
Prothrombin Time: 13.4 seconds (ref 11.6–15.2)

## 2013-09-09 LAB — VITAMIN B12: Vitamin B-12: 341 pg/mL (ref 211–911)

## 2013-09-09 SURGERY — OPEN REDUCTION INTERNAL FIXATION HIP
Anesthesia: Spinal | Site: Hip | Laterality: Left

## 2013-09-09 MED ORDER — MORPHINE SULFATE 2 MG/ML IJ SOLN
2.0000 mg | INTRAMUSCULAR | Status: DC | PRN
  Administered 2013-09-09: 2 mg via INTRAVENOUS
  Filled 2013-09-09: qty 1

## 2013-09-09 MED ORDER — FENTANYL CITRATE 0.05 MG/ML IJ SOLN
INTRAMUSCULAR | Status: AC
  Filled 2013-09-09: qty 2

## 2013-09-09 MED ORDER — LIDOCAINE HCL (CARDIAC) 10 MG/ML IV SOLN
INTRAVENOUS | Status: DC | PRN
  Administered 2013-09-09: 50 mg via INTRAVENOUS

## 2013-09-09 MED ORDER — ENOXAPARIN SODIUM 30 MG/0.3ML ~~LOC~~ SOLN
30.0000 mg | SUBCUTANEOUS | Status: DC
  Administered 2013-09-10 – 2013-09-12 (×3): 30 mg via SUBCUTANEOUS
  Filled 2013-09-09 (×3): qty 0.3

## 2013-09-09 MED ORDER — BUPIVACAINE IN DEXTROSE 0.75-8.25 % IT SOLN
INTRATHECAL | Status: DC | PRN
  Administered 2013-09-09: 13 mg via INTRATHECAL

## 2013-09-09 MED ORDER — LACTATED RINGERS IV SOLN
INTRAVENOUS | Status: DC | PRN
  Administered 2013-09-09: 08:00:00 via INTRAVENOUS

## 2013-09-09 MED ORDER — HYDROGEN PEROXIDE 3 % EX SOLN
CUTANEOUS | Status: DC | PRN
  Administered 2013-09-09: 1 via TOPICAL

## 2013-09-09 MED ORDER — MIDAZOLAM HCL 5 MG/5ML IJ SOLN
INTRAMUSCULAR | Status: DC | PRN
Start: 2013-09-09 — End: 2013-09-09
  Administered 2013-09-09: .5 mg via INTRAVENOUS

## 2013-09-09 MED ORDER — MIDAZOLAM HCL 2 MG/2ML IJ SOLN
INTRAMUSCULAR | Status: AC
  Filled 2013-09-09: qty 2

## 2013-09-09 MED ORDER — PROPOFOL 10 MG/ML IV BOLUS
INTRAVENOUS | Status: AC
  Filled 2013-09-09: qty 20

## 2013-09-09 MED ORDER — FENTANYL CITRATE 0.05 MG/ML IJ SOLN
INTRAMUSCULAR | Status: DC | PRN
  Administered 2013-09-09 (×3): 12.5 ug via INTRAVENOUS
  Administered 2013-09-09: 22.5 ug via INTRAVENOUS
  Administered 2013-09-09: 20 ug via INTRATHECAL

## 2013-09-09 MED ORDER — PROPOFOL INFUSION 10 MG/ML OPTIME
INTRAVENOUS | Status: DC | PRN
  Administered 2013-09-09: 30 ug/kg/min via INTRAVENOUS

## 2013-09-09 MED ORDER — EPHEDRINE SULFATE 50 MG/ML IJ SOLN
INTRAMUSCULAR | Status: DC | PRN
  Administered 2013-09-09: 5 mg via INTRAVENOUS

## 2013-09-09 MED ORDER — SODIUM CHLORIDE 0.9 % IR SOLN
Status: DC | PRN
  Administered 2013-09-09: 1000 mL

## 2013-09-09 MED ORDER — ALBUTEROL SULFATE (2.5 MG/3ML) 0.083% IN NEBU
2.5000 mg | INHALATION_SOLUTION | Freq: Four times a day (QID) | RESPIRATORY_TRACT | Status: AC
  Administered 2013-09-09 – 2013-09-12 (×10): 2.5 mg via RESPIRATORY_TRACT
  Filled 2013-09-09 (×10): qty 3

## 2013-09-09 MED ORDER — MAGNESIUM HYDROXIDE 400 MG/5ML PO SUSP
30.0000 mL | Freq: Every day | ORAL | Status: DC | PRN
Start: 2013-09-09 — End: 2013-09-13

## 2013-09-09 MED ORDER — PHENYLEPHRINE HCL 10 MG/ML IJ SOLN
INTRAMUSCULAR | Status: DC | PRN
  Administered 2013-09-09 (×6): 100 ug via INTRAVENOUS

## 2013-09-09 MED ORDER — FENTANYL 12 MCG/HR TD PT72
12.5000 ug | MEDICATED_PATCH | TRANSDERMAL | Status: DC
  Administered 2013-09-09 – 2013-09-12 (×2): 12.5 ug via TRANSDERMAL
  Filled 2013-09-09 (×2): qty 1

## 2013-09-09 SURGICAL SUPPLY — 48 items
BAG HAMPER (MISCELLANEOUS) ×3 IMPLANT
BIT DRILL TWIST 3.5MM (BIT) ×1 IMPLANT
BLADE SURG SZ10 CARB STEEL (BLADE) ×4 IMPLANT
BLADE SURG SZ20 CARB STEEL (BLADE) ×3 IMPLANT
CLEANER TIP ELECTROSURG 2X2 (MISCELLANEOUS) ×2 IMPLANT
CLOTH BEACON ORANGE TIMEOUT ST (SAFETY) ×3 IMPLANT
COVER LIGHT HANDLE STERIS (MISCELLANEOUS) ×6 IMPLANT
COVER MAYO STAND XLG (DRAPE) ×3 IMPLANT
DRAPE STERI IOBAN 125X83 (DRAPES) ×3 IMPLANT
DRILL TWIST 3.5MM (BIT) ×3
ELECT REM PT RETURN 9FT ADLT (ELECTROSURGICAL) ×3
ELECTRODE REM PT RTRN 9FT ADLT (ELECTROSURGICAL) ×1 IMPLANT
EVACUATOR 3/16  PVC DRAIN (DRAIN) ×2
EVACUATOR 3/16 PVC DRAIN (DRAIN) ×1 IMPLANT
GAUZE XEROFORM 5X9 LF (GAUZE/BANDAGES/DRESSINGS) ×3 IMPLANT
GLOVE BIO SURGEON STRL SZ8 (GLOVE) ×3 IMPLANT
GLOVE BIO SURGEON STRL SZ8.5 (GLOVE) ×3 IMPLANT
GOWN STRL REUS W/TWL LRG LVL3 (GOWN DISPOSABLE) ×5 IMPLANT
GOWN STRL REUS W/TWL XL LVL3 (GOWN DISPOSABLE) ×3 IMPLANT
GUIDE PIN CALIBRATED (PIN) ×3 IMPLANT
GUIDE PIN CALIBRATED 2.4X23 (PIN) ×1 IMPLANT
INST SET MAJOR BONE (KITS) ×3 IMPLANT
KIT BLADEGUARD II DBL (SET/KITS/TRAYS/PACK) ×3 IMPLANT
KIT ROOM TURNOVER AP CYSTO (KITS) ×3 IMPLANT
MANIFOLD NEPTUNE II (INSTRUMENTS) ×3 IMPLANT
MARKER SKIN DUAL TIP RULER LAB (MISCELLANEOUS) ×3 IMPLANT
NS IRRIG 1000ML POUR BTL (IV SOLUTION) ×3 IMPLANT
PACK BASIC III (CUSTOM PROCEDURE TRAY) ×3
PACK SRG BSC III STRL LF ECLPS (CUSTOM PROCEDURE TRAY) ×1 IMPLANT
PAD ABD 5X9 TENDERSORB (GAUZE/BANDAGES/DRESSINGS) ×3 IMPLANT
PAD ARMBOARD 7.5X6 YLW CONV (MISCELLANEOUS) ×3 IMPLANT
PENCIL HANDSWITCHING (ELECTRODE) ×3 IMPLANT
PLATE SHORT BARREL 135X4 (Plate) ×2 IMPLANT
SCREW CORTICAL SFTP 4.5X38MM (Screw) ×2 IMPLANT
SCREW CORTICAL SFTP 4.5X42MM (Screw) ×4 IMPLANT
SCREW CORTICAL SFTP 4.5X44MM (Screw) ×2 IMPLANT
SCREW LAG 95MM (Screw) ×3 IMPLANT
SCREW LAGSTD 95X21X12.7X9 (Screw) IMPLANT
SET BASIN LINEN APH (SET/KITS/TRAYS/PACK) ×3 IMPLANT
SPONGE GAUZE 4X4 12PLY (GAUZE/BANDAGES/DRESSINGS) ×3 IMPLANT
SPONGE LAP 18X18 X RAY DECT (DISPOSABLE) ×6 IMPLANT
STAPLER VISISTAT 35W (STAPLE) ×3 IMPLANT
SUT BRALON NAB BRD #1 30IN (SUTURE) ×8 IMPLANT
SUT PLAIN 2 0 XLH (SUTURE) ×5 IMPLANT
SUT SILK 0 FSL (SUTURE) ×3 IMPLANT
SYR BULB IRRIGATION 50ML (SYRINGE) ×3 IMPLANT
TAPE MEDIFIX FOAM 3 (GAUZE/BANDAGES/DRESSINGS) ×3 IMPLANT
YANKAUER SUCT 12FT TUBE ARGYLE (SUCTIONS) ×3 IMPLANT

## 2013-09-09 NOTE — Anesthesia Postprocedure Evaluation (Signed)
  Anesthesia Post-op Note  Patient: Madison Hickman  Procedure(s) Performed: Procedure(s): OPEN TREATMENT INTERNAL FIXATION LEFT HIP FRACTURE (Left)  Patient Location: PACU  Anesthesia Type:Spinal  Level of Consciousness: awake, alert , oriented and patient cooperative  Airway and Oxygen Therapy: Patient Spontanous Breathing and Patient connected to nasal cannula oxygen  Post-op Pain: none  Post-op Assessment: Post-op Vital signs reviewed, Patient's Cardiovascular Status Stable, Respiratory Function Stable, Patent Airway, No signs of Nausea or vomiting and Pain level controlled  Post-op Vital Signs: Reviewed and stable  Complications: No apparent anesthesia complications

## 2013-09-09 NOTE — Brief Op Note (Signed)
09/07/2013 - 09/09/2013  9:53 AM  PATIENT:  Madison Hickman  80 y.o. female  PRE-OPERATIVE DIAGNOSIS:  Intertrochanteric Fracture Left Hip  POST-OPERATIVE DIAGNOSIS:  Intertrochanteric Fracture Left Hip  PROCEDURE:  Procedure(s): OPEN TREATMENT INTERNAL FIXATION LEFT HIP FRACTURE (Left)  SURGEON:  Surgeon(s) and Role:    * Sanjuana Kava, MD - Primary  PHYSICIAN ASSISTANT:   ASSISTANTS: none   ANESTHESIA:   spinal  EBL:  Total I/O In: 500 [I.V.:500] Out: 550 [Urine:500; Blood:50]  BLOOD ADMINISTERED:none  DRAINS: (Large) Hemovact drain(s) in the Left hip area with  Suction Open   LOCAL MEDICATIONS USED:  NONE  SPECIMEN:  No Specimen  DISPOSITION OF SPECIMEN:  N/A  COUNTS:  YES  TOURNIQUET:  * No tourniquets in log *  DICTATION: .Other Dictation: Dictation Number [id number]  PLAN OF CARE: Admit to inpatient   PATIENT DISPOSITION:  PACU - hemodynamically stable.   Delay start of Pharmacological VTE agent (>24hrs) due to surgical blood loss or risk of bleeding: no

## 2013-09-09 NOTE — Op Note (Signed)
NAMEGEORJEAN, TOYA                ACCOUNT NO.:  [id number]  MEDICAL RECORD NO.:  [id number]  LOCATION:  [facility]                          FACILITY:  [facility]  PHYSICIAN:  J. Sanjuana Kava, M.D. DATE OF BIRTH:  Oct 04, 1923  DATE OF PROCEDURE: DATE OF DISCHARGE:                              OPERATIVE REPORT   PREOPERATIVE DIAGNOSIS:  Intertrochanteric fracture of the left hip.  POSTOPERATIVE DIAGNOSIS:  Intertrochanteric fracture of the left hip.  PROCEDURE:  Open treatment internal reduction of left hip intertrochanteric fracture using a Richard's hip compression screw with a 95-mm long compression screw, 4-hole short barrel side plate 135 degrees.  Screws measuring from 38 mm to 44 mm.  ANESTHESIA:  Spinal.  SURGEON:  J. Sanjuana Kava, MD  DRAINS:  One large Hemovac drain.  ESTIMATED BLOOD LOSS:  200-250 mL, none replaced.  INDICATIONS:  The patient is an 80 year old female who fell at her local nursing home and injured her left hip, Thursday evening.  She had been seen and evaluated by the hospitalist and found to be at risk but an acceptable surgical candidate.  Patient has other multiple medical problems including failure to thrive.  I discussed the risks and imponderables with the patient.  She is mentally alert, cooperative, and oriented.  I have also discussed the surgery with the patient's daughter by telephone.  They appeared to be understanding, agreed the procedure as outlined.  They understand she is at increased risk secondary to her age.  The patient was seen in the holding area and the left hip was identified as a correct surgical site.  I placed a mark on the left hip.  The patient was brought to the operating room and given spinal anesthesia. She was then placed on the fracture table supine.  She was placed into the appropriate position.  The traction was applied to the system.  The fluoroscopy unit was brought in.  Everyone had on lead aprons  thyroid shields,  x-ray badges.  Had a radiology time-out.  X-rays were then taken, AP and lateral views showed reduction of the fracture.  The patient was then prepped and draped in usual manner.  A time-out identifying the patient as Ms. Messman and we are doing left hip intertrochanteric fracture.  All instrumentation was properly positioned and working.  As previously mentioned,the x-ray unit was working  well. Everyone in the room knew each other.  Incision was made through skin, subcutaneous tissue, tensor fascia lata vastus lateralis.  Femoral shaft identified.  Guide pin placed, looked very good in AP and lateral views. It measured 100 mm and I selected a 95 mm compression screw.  Step drill was used and the compression screw was inserted with 135 degree short barrel 4-hole side plate.  Screws were inserted with compression applied to the system.  Screws measuring from 38 mm to 44 mm. Permanent x-rays were taken.  These looked good.  Hemovac drain was then placed. This was tied with 2-0 silk suture.  Vastus lateralis was reapproximated using running locking #1 Surgilon suture.  The tensor fascia lata reapproximated using interrupted figure-of-eight #1 Surgilon suture. Subcutaneous tissue was closed using 2-0 plain, skin staples used. Sterile  dressing applied.  The patient tolerated procedure well And went to the recovery room in good condition.          ______________________________ Lenna Sciara. Sanjuana Kava, M.D.     JWK/MEDQ  D:  09/09/2013  T:  09/09/2013  Job:  [id number]

## 2013-09-09 NOTE — Anesthesia Procedure Notes (Addendum)
Spinal  Patient location during procedure: OR Start time: 09/09/2013 8:44 AM Staffing CRNA/Resident: Lenvil Swaim A Preanesthetic Checklist Completed: patient identified, site marked, surgical consent, pre-op evaluation, timeout performed, IV checked, risks and benefits discussed and monitors and equipment checked Spinal Block Patient position: left lateral decubitus Prep: Betadine Patient monitoring: heart rate, cardiac monitor, continuous pulse ox and blood pressure Approach: left paramedian Location: L3-4 Injection technique: single-shot Needle Needle type: Spinocan  Needle gauge: 22 G Needle length: 9 cm Assessment Sensory level: T8 Additional Notes  ATTEMPTS:2 TRAY [id number] TRAY EXPIRATION DATE:07/2014  Bupivacaine 13mg ,Fentanyl 20 mcg, epi.1 injected intrathecally at 0844; patient tolerated well.  Date/Time: 09/09/2013 8:17 AM Performed by: Andree Elk, Devante Capano A Pre-anesthesia Checklist: Patient identified, Timeout performed, Emergency Drugs available, Suction available and Patient being monitored Oxygen Delivery Method: Non-rebreather mask

## 2013-09-09 NOTE — Progress Notes (Signed)
[facility] PROGRESS NOTE  Madison Hickman DTO:[id number] DOB: Dec 13, 1923 DOA: 09/07/2013 PCP: Jani Gravel, MD    Code Status: DO NOT RESUSCITATE Family Communication: Discussed with her son Madison Hickman. Disposition Plan: Likely discharge back to skilled nursing facility when clinically appropriate   Consultants:  Orthopedic, Dr. Luna Glasgow  Procedures:  Open treatment internal reduction of left hip intertrochanteric fracture, Dr. Luna Glasgow, 09/09/13.  Antibiotics:  Cefazolin IV x1, 09/09/13.  HPI/Subjective: The patient has recently gotten back from left hip surgery. She complains of pain in her left leg and feeling cold. No chest pain or shortness of breath.  Objective: Filed Vitals:   09/09/13 1230  BP: 133/71  Pulse: 88  Temp: 98.2 F (36.8 C)  Resp: 20   oxygen saturation 100%.  Intake/Output Summary (Last 24 hours) at 09/09/13 1338 Last data filed at 09/09/13 1006  Gross per 24 hour  Intake   1400 ml  Output   2300 ml  Net   -900 ml   Filed Weights   09/07/13 1428 09/07/13 1706 09/09/13 0540  Weight: 40.824 kg (90 lb) 43.5 kg (95 lb 14.4 oz) 43.2 kg (95 lb 3.8 oz)    Exam:   General:  Pleasant alert 80 year old woman in no acute distress.  Cardiovascular: S1, S2, with a soft systolic murmur.  Respiratory: Clear anteriorly with decreased breath sounds in the bases.  Abdomen: Soft, nontender, nondistended, positive bowel sounds.  Musculoskeletal: Left lower extremity with generalized edema and dressing in place, not taken off.. No edema of the right lower extremity.  Neurologic: She is alert and oriented to herself in hospital and son. Cranial nerves II through XII are grossly intact.   Data Reviewed: Basic Metabolic Panel:  Recent Labs Lab 09/07/13 1448 09/08/13 0525 09/09/13 0558  NA 142 143 143  K 4.1 3.7 3.7  CL 107 110 108  CO2 24 23 23   GLUCOSE 181* 114* 102*  BUN 16 12 8   CREATININE 0.82 0.60 0.54  CALCIUM 10.4 9.7 10.4   Liver Function  Tests:  Recent Labs Lab 09/08/13 0525  AST 15  ALT 8  ALKPHOS 51  BILITOT 0.5  PROT 5.7*  ALBUMIN 3.0*   No results found for this basename: LIPASE, AMYLASE,  in the last 168 hours No results found for this basename: AMMONIA,  in the last 168 hours CBC:  Recent Labs Lab 09/07/13 1448 09/08/13 0525 09/08/13 2042 09/09/13 0558  WBC 11.9* 9.1 8.0 9.7  NEUTROABS 10.2*  --  5.9  --   HGB 10.7* 9.7* 11.0* 11.4*  HCT 33.0* 29.8* 32.9* 34.5*  MCV 86.4 86.6 86.4 86.0  PLT 284 251 215 227   Cardiac Enzymes: No results found for this basename: CKTOTAL, CKMB, CKMBINDEX, TROPONINI,  in the last 168 hours BNP (last 3 results)  Recent Labs  11/04/12 1245 11/10/12 1726  PROBNP 1050.0* 1130.0*   CBG: No results found for this basename: GLUCAP,  in the last 168 hours  Recent Results (from the past 240 hour(s))  SURGICAL PCR SCREEN     Status: None   Collection Time    09/08/13  7:00 PM      Result Value Ref Range Status   MRSA, PCR NEGATIVE  NEGATIVE Final   Staphylococcus aureus NEGATIVE  NEGATIVE Final   Comment:            The Xpert SA Assay (FDA     approved for NASAL specimens     in patients over 84 years of age),  is one component of     a comprehensive surveillance     program.  Test performance has     been validated by [facility] for patients greater     than or equal to 49 year old.     It is not intended     to diagnose infection nor to     guide or monitor treatment.     Studies: Dg Chest 1 View  09/07/2013   CLINICAL DATA:  Fall.  Hip pain  EXAM: CHEST - 1 VIEW  COMPARISON:  03/19/2013  FINDINGS: Cardiac enlargement.  Thoracic aortic aneurysm with tortuosity.  Negative for heart failure. Negative for infiltrate effusion or mass.  IMPRESSION: Cardiac enlargement.  No acute abnormality.   Electronically Signed   By: Franchot Gallo M.D.   On: 09/07/2013 15:10   Dg Hip Complete Left  09/09/2013   CLINICAL DATA:  Status post ORIF of left femur  EXAM:  LEFT HIP - COMPLETE 2+ VIEW  COMPARISON:  09/07/2013  FINDINGS: Three images from C-arm radiography obtained in the operating room show changes from ORIF of intertrochanteric fracture of proximal left femur. There is a dynamic hip screw and sideplate device which reduces the fracture.  IMPRESSION: 1. Status post ORIF of left proximal femur fracture.   Electronically Signed   By: Kerby Moors M.D.   On: 09/09/2013 11:43   Dg Hip Complete Left  09/07/2013   CLINICAL DATA:  Fall  EXAM: LEFT HIP - COMPLETE 2+ VIEW  COMPARISON:  None.  FINDINGS: Intertrochanteric fracture of the left femur with angulation. Left hip joint is satisfactory.  Pessary is present in the vagina.  IMPRESSION: Angulated intertrochanteric fracture left femur   Electronically Signed   By: Franchot Gallo M.D.   On: 09/07/2013 15:13    Scheduled Meds: . albuterol  2.5 mg Nebulization Q6H  . amLODipine  5 mg Oral Daily  . enalapril  10 mg Oral BID  . [START ON 09/10/2013] enoxaparin (LOVENOX) injection  30 mg Subcutaneous Q24H  . pantoprazole  40 mg Oral Daily  . senna-docusate  1 tablet Oral BID  . sertraline  25 mg Oral QHS   Continuous Infusions: . 0.9 % NaCl with KCl 20 mEq / L 50 mL/hr (09/08/13 1440)    Assessment:  Principal Problem:   Hip fracture, intertrochanteric Active Problems:   Chronic diastolic congestive heart failure   Microcytic anemia   Chronic back pain   Essential hypertension, benign  1. Acute left hip fracture, intertrochanteric, secondary to a fall at the skilled nursing facility. Status post open treatment internal reduction of fracture on 09/09/13 by Dr. Luna Glasgow. She is in some discomfort. Continue as needed oxycodone. Restart the fentanyl patch.  Hypertension. Currently controlled. We'll continue amlodipine and enalapril.  Chronic low back pain, on chronic transdermal fentanyl.   Chronic diastolic heart failure, clinically compensated.  Known iron deficiency/microcytic anemia. Currently  stable.  Mild confusion, possibly mild underlying dementia. Vitamin B12 level and a TSH pending.     Plan: 1. Restart fentanyl transdermal 2. Check TSH and vitamin B12 for further evaluation. 3. Postop wound care, and anticoagulation, and physical therapy recommendations, per Dr. Luna Glasgow.  Time spent: 35 minutes    [facility] Pager [phone]. If 7PM-7AM, please contact night-coverage at [email or web address], password [facility] 09/09/2013, 1:38 PM  LOS: 2 days

## 2013-09-09 NOTE — Transfer of Care (Signed)
Immediate Anesthesia Transfer of Care Note  Patient: Madison Hickman  Procedure(s) Performed: Procedure(s): OPEN TREATMENT INTERNAL FIXATION LEFT HIP FRACTURE (Left)  Patient Location: PACU  Anesthesia Type:Spinal  Level of Consciousness: awake, alert , oriented and patient cooperative  Airway & Oxygen Therapy: Patient Spontanous Breathing and Patient connected to nasal cannula oxygen  Post-op Assessment: Report given to PACU RN and Post -op Vital signs reviewed and stable  Post vital signs: Reviewed and stable  Complications: No apparent anesthesia complications

## 2013-09-09 NOTE — Anesthesia Preprocedure Evaluation (Addendum)
Anesthesia Evaluation  Patient identified by MRN, date of birth, ID band Patient awake, Patient confused and Patient unresponsive    Reviewed: Allergy & Precautions, H&P , NPO status , Patient's Chart, lab work & pertinent test results, reviewed documented beta blocker date and time   Airway Mallampati: II TM Distance: >3 FB Neck ROM: full    Dental  (+) Edentulous Upper, Edentulous Lower   Pulmonary  breath sounds clear to auscultation        Cardiovascular hypertension, +CHF Atrial Fibrillation Rhythm:regular Rate:Tachycardia  History of AFIB but now NSR   Neuro/Psych  Headaches, Dementia    GI/Hepatic   Endo/Other    Renal/GU      Musculoskeletal   Abdominal   Peds  Hematology  (+) anemia ,   Anesthesia Other Findings   Reproductive/Obstetrics                          Anesthesia Physical Anesthesia Plan  ASA: III and emergent  Anesthesia Plan: Spinal   Post-op Pain Management: MAC Combined w/ Regional for Post-op pain   Induction:   Airway Management Planned:   Additional Equipment:   Intra-op Plan:   Post-operative Plan:   Informed Consent: I have reviewed the patients History and Physical, chart, labs and discussed the procedure including the risks, benefits and alternatives for the proposed anesthesia with the patient or authorized representative who has indicated his/her understanding and acceptance.   Dental Advisory Given  Plan Discussed with: Anesthesiologist and Surgeon  Anesthesia Plan Comments:        Anesthesia Quick Evaluation

## 2013-09-10 LAB — BASIC METABOLIC PANEL
BUN: 12 mg/dL (ref 6–23)
CALCIUM: 10.1 mg/dL (ref 8.4–10.5)
CO2: 23 mEq/L (ref 19–32)
Chloride: 110 mEq/L (ref 96–112)
Creatinine, Ser: 0.53 mg/dL (ref 0.50–1.10)
GFR calc non Af Amer: 83 mL/min — ABNORMAL LOW (ref >90)
Glucose, Bld: 126 mg/dL — ABNORMAL HIGH (ref 70–99)
Potassium: 3.5 mEq/L — ABNORMAL LOW (ref 3.7–5.3)
SODIUM: 144 meq/L (ref 137–147)

## 2013-09-10 LAB — CBC WITH DIFFERENTIAL/PLATELET
BASOS ABS: 0 10*3/uL (ref 0.0–0.1)
BASOS PCT: 0 % (ref 0–1)
EOS ABS: 0 10*3/uL (ref 0.0–0.7)
EOS PCT: 0 % (ref 0–5)
HCT: 28.9 % — ABNORMAL LOW (ref 36.0–46.0)
Hemoglobin: 9.5 g/dL — ABNORMAL LOW (ref 12.0–15.0)
Lymphocytes Relative: 8 % — ABNORMAL LOW (ref 12–46)
Lymphs Abs: 0.7 10*3/uL (ref 0.7–4.0)
MCH: 28.3 pg (ref 26.0–34.0)
MCHC: 32.9 g/dL (ref 30.0–36.0)
MCV: 86 fL (ref 78.0–100.0)
Monocytes Absolute: 1 10*3/uL (ref 0.1–1.0)
Monocytes Relative: 11 % (ref 3–12)
Neutro Abs: 7.7 10*3/uL (ref 1.7–7.7)
Neutrophils Relative %: 82 % — ABNORMAL HIGH (ref 43–77)
PLATELETS: 206 10*3/uL (ref 150–400)
RBC: 3.36 MIL/uL — ABNORMAL LOW (ref 3.87–5.11)
RDW: 16.7 % — AB (ref 11.5–15.5)
WBC: 9.5 10*3/uL (ref 4.0–10.5)

## 2013-09-10 MED ORDER — FUROSEMIDE 10 MG/ML IJ SOLN
10.0000 mg | Freq: Once | INTRAMUSCULAR | Status: AC
  Administered 2013-09-10: 10 mg via INTRAVENOUS
  Filled 2013-09-10: qty 2

## 2013-09-10 MED ORDER — MORPHINE SULFATE 2 MG/ML IJ SOLN: 1.0000 mg | INTRAMUSCULAR | Status: DC | PRN

## 2013-09-10 NOTE — Progress Notes (Signed)
Subjective: 1 Day Post-Op Procedure(s) (LRB): OPEN TREATMENT INTERNAL FIXATION LEFT HIP FRACTURE (Left) Patient reports pain as 5 on 0-10 scale.    Objective: Vital signs in last 24 hours: Temp:  [97.4 F (36.3 C)-99.2 F (37.3 C)] 99.2 F (37.3 C) (03/08 0551) Pulse Rate:  [78-94] 87 (03/08 0551) Resp:  [18-20] 20 (03/08 0551) BP: (85-150)/(60-90) 140/71 mmHg (03/08 0551) SpO2:  [96 %-100 %] 98 % (03/08 0551)  Intake/Output from previous day: 03/07 0701 - 03/08 0700 In: 920 [P.O.:120; I.V.:800] Out: 1150 [Urine:1050; Blood:100] Intake/Output this shift:     Recent Labs  09/07/13 1448 09/08/13 0525 09/08/13 2042 09/09/13 0558 09/10/13 0637  HGB 10.7* 9.7* 11.0* 11.4* 9.5*    Recent Labs  09/09/13 0558 09/10/13 0637  WBC 9.7 9.5  RBC 4.01 3.36*  HCT 34.5* 28.9*  PLT 227 206    Recent Labs  09/09/13 0558 09/10/13 0637  NA 143 144  K 3.7 3.5*  CL 108 110  CO2 23 23  BUN 8 12  CREATININE 0.54 0.53  GLUCOSE 102* 126*  CALCIUM 10.4 10.1    Recent Labs  09/07/13 1448 09/09/13 0800  INR 1.02 1.04    Neurologically intact Neurovascular intact Sensation intact distally Intact pulses distally Dorsiflexion/Plantar flexion intact  I will give one unit of blood today. Physical therapy is present with her now.   Assessment/Plan: 1 Day Post-Op Procedure(s) (LRB): OPEN TREATMENT INTERNAL FIXATION LEFT HIP FRACTURE (Left) Up with therapy  Maris Abascal 09/10/2013, 10:34 AM

## 2013-09-10 NOTE — Evaluation (Signed)
Physical Therapy Evaluation Patient Details Name: Madison Hickman MRN: [id number] DOB: 11-Dec-1923 Today's Date: 09/10/2013 Time: 1007-1100 PT Time Calculation (min): 53 min  PT Assessment / Plan / Recommendation History of Present Illness  patient present to ED with history of fall resulting into fracture left hip and underwent Internal fixation for the same on 09/09/2013 resulting inot increased weakness , difficulty in bed mobility , transfers and gait.  Clinical Impression  Patient seen lying supine in bed , performed bed mobility with max A ,patient with increased c/o pain , needed cues for alerting and awaking,  sitting at EOB with support and decreased weight bearing on LLE sec to pain , daughter present during therapy helped with feeding breakfast , patient did not eat much , patient not oriented and more lethargic , attempted to perform STS transfer from raised bed , needed max A for the same, transferred to recliner with SPT and dependent , positioned well in the recliner with family , family plans for dc to SNF and continue with skilled PT and OT services which is appropriate at this point.    PT Assessment  Patient needs continued PT services    Follow Up Recommendations  SNF    Does the patient have the potential to tolerate intense rehabilitation      Barriers to Discharge        Equipment Recommendations  Rolling walker with 5" wheels    Recommendations for Other Services     Frequency Min 6X/week    Precautions / Restrictions Precautions Precautions: Fall Restrictions Weight Bearing Restrictions: Yes Other Position/Activity Restrictions: Toe touch weight bearing on left LE   Pertinent Vitals/Pain       Mobility  Bed Mobility Overal bed mobility: Needs Assistance Bed Mobility: Supine to Sit Supine to sit: Max assist General bed mobility comments: Needed assistance with BL LE placements OOB Transfers Overall transfer level: Needs assistance Transfers: Stand  Pivot Transfers Stand pivot transfers: Total assist    Exercises     PT Diagnosis: Difficulty walking;Abnormality of gait;Acute pain;Generalized weakness  PT Problem List: Decreased strength;Decreased activity tolerance;Decreased balance;Decreased mobility PT Treatment Interventions: DME instruction;Gait training;Functional mobility training;Therapeutic activities;Therapeutic exercise;Balance training     PT Goals(Current goals can be found in the care plan section) Acute Rehab PT Goals Patient Stated Goal: patient stated wants to go home PT Goal Formulation: With patient/family Time For Goal Achievement: 09/17/13 Potential to Achieve Goals: Good  Visit Information  Last PT Received On: 09/10/13 Assistance Needed: +1 History of Present Illness: patient present to ED with history of fall resulting into fracture left hip and underwent Internal fixation for the same on 09/09/2013 resulting inot increased weakness , difficulty in bed mobility , transfers and gait.       Prior Mena expects to be discharged to:: Skilled nursing facility Additional Comments: as per daughter patient was ambulatory using RW Prior Function Comments: as per daughter patient was able to ambulate and transfer on her own    Cognition  Cognition Arousal/Alertness: Lethargic Behavior During Therapy: Flat affect Overall Cognitive Status: Difficult to assess Difficult to assess due to: Level of arousal    Extremity/Trunk Assessment Upper Extremity Assessment Upper Extremity Assessment: Defer to OT evaluation   Balance Balance Overall balance assessment: Needs assistance Sitting-balance support: Bilateral upper extremity supported Sitting balance-Leahy Scale: Poor Sitting balance - Comments: decrease weigth bearing on L side sec to pain. Postural control: Posterior lean  End of Session PT - End  of Session Equipment Utilized During Treatment: Gait belt Activity Tolerance:  Patient limited by fatigue;Patient limited by lethargy;Patient limited by pain Patient left: in chair;with call bell/phone within reach;with family/visitor present Nurse Communication: Mobility status;Weight bearing status  GP     Darius Bump 09/10/2013, 12:08 PM

## 2013-09-10 NOTE — Progress Notes (Signed)
[facility] PROGRESS NOTE  Madison Hickman EQA:[id number] DOB: 12-18-1923 DOA: 09/07/2013 PCP: Jani Gravel, MD    Code Status: DO NOT RESUSCITATE Family Communication: Discussed with her daughter. Disposition Plan: Likely discharge back to skilled nursing facility when clinically appropriate   Consultants:  Orthopedic, Dr. Luna Glasgow  Procedures:  Open treatment internal reduction of left hip intertrochanteric fracture, Dr. Luna Glasgow, 09/09/13.  Antibiotics:  Cefazolin IV x1, 09/09/13.  HPI/Subjective: The patient is sitting up in a recliner. She was given Percocet and Ativan earlier for some pain and mild agitation. Currently, she is without pain. Her daughter is in her room, questions answered.  Objective: Filed Vitals:   09/10/13 1314  BP: 139/99  Pulse: 91  Temp: 97.8 F (36.6 C)  Resp: 20   oxygen saturation 98%  Intake/Output Summary (Last 24 hours) at 09/10/13 1324 Last data filed at 09/10/13 1315  Gross per 24 hour  Intake 1232.5 ml  Output    550 ml  Net  682.5 ml   Filed Weights   09/07/13 1428 09/07/13 1706 09/09/13 0540  Weight: 40.824 kg (90 lb) 43.5 kg (95 lb 14.4 oz) 43.2 kg (95 lb 3.8 oz)    Exam:   General:  Pleasant alert 80 year old woman in no acute distress.  Cardiovascular: S1, S2, with a soft systolic murmur.  Respiratory: Clear anteriorly with decreased breath sounds in the bases.  Abdomen: Soft, nontender, nondistended, positive bowel sounds.  Musculoskeletal: Left lower extremity/hip with bandage in Hemovac in place. The Hemovac is draining a small amount of blood. Globally tender upon palpation. No edema of the right lower extremity.  Neurologic: She is mildly sedated, but arousable to her name. She answered questions appropriately. Cranial nerves II through XII are grossly intact.   Data Reviewed: Basic Metabolic Panel:  Recent Labs Lab 09/07/13 1448 09/08/13 0525 09/09/13 0558 09/10/13 0637  NA 142 143 143 144  K 4.1 3.7  3.7 3.5*  CL 107 110 108 110  CO2 24 23 23 23   GLUCOSE 181* 114* 102* 126*  BUN 16 12 8 12   CREATININE 0.82 0.60 0.54 0.53  CALCIUM 10.4 9.7 10.4 10.1   Liver Function Tests:  Recent Labs Lab 09/08/13 0525  AST 15  ALT 8  ALKPHOS 51  BILITOT 0.5  PROT 5.7*  ALBUMIN 3.0*   No results found for this basename: LIPASE, AMYLASE,  in the last 168 hours No results found for this basename: AMMONIA,  in the last 168 hours CBC:  Recent Labs Lab 09/07/13 1448 09/08/13 0525 09/08/13 2042 09/09/13 0558 09/10/13 0637  WBC 11.9* 9.1 8.0 9.7 9.5  NEUTROABS 10.2*  --  5.9  --  7.7  HGB 10.7* 9.7* 11.0* 11.4* 9.5*  HCT 33.0* 29.8* 32.9* 34.5* 28.9*  MCV 86.4 86.6 86.4 86.0 86.0  PLT 284 251 215 227 206   Cardiac Enzymes: No results found for this basename: CKTOTAL, CKMB, CKMBINDEX, TROPONINI,  in the last 168 hours BNP (last 3 results)  Recent Labs  11/04/12 1245 11/10/12 1726  PROBNP 1050.0* 1130.0*   CBG: No results found for this basename: GLUCAP,  in the last 168 hours  Recent Results (from the past 240 hour(s))  SURGICAL PCR SCREEN     Status: None   Collection Time    09/08/13  7:00 PM      Result Value Ref Range Status   MRSA, PCR NEGATIVE  NEGATIVE Final   Staphylococcus aureus NEGATIVE  NEGATIVE Final   Comment:  The Xpert SA Assay (FDA     approved for NASAL specimens     in patients over 28 years of age),     is one component of     a comprehensive surveillance     program.  Test performance has     been validated by [employer] for patients greater     than or equal to 45 year old.     It is not intended     to diagnose infection nor to     guide or monitor treatment.     Studies: Dg Hip Complete Left  09/09/2013   CLINICAL DATA:  Status post ORIF of left femur  EXAM: LEFT HIP - COMPLETE 2+ VIEW  COMPARISON:  09/07/2013  FINDINGS: Three images from C-arm radiography obtained in the operating room show changes from ORIF of  intertrochanteric fracture of proximal left femur. There is a dynamic hip screw and sideplate device which reduces the fracture.  IMPRESSION: 1. Status post ORIF of left proximal femur fracture.   Electronically Signed   By: Kerby Moors M.D.   On: 09/09/2013 11:43   Dg C-arm 1-60 Min  09/09/2013   CLINICAL DATA:  Internal fixation.  Left hip fracture.  EXAM: DG C-ARM 1-60 MIN  FINDINGS: Three intraoperative spot images demonstrate placement dynamic hip screw and lateral plate fixation device across the left femoral intertrochanteric fracture. Anatomic alignment. No hardware or bony complicating feature noted on these intraoperative spot images.  IMPRESSION: Internal fixation across the left femoral intertrochanteric fracture. No visible complicating feature.  : COMPARISON:  09/07/2013   Electronically Signed   By: Rolm Baptise M.D.   On: 09/09/2013 14:18    Scheduled Meds: . albuterol  2.5 mg Nebulization Q6H  . amLODipine  5 mg Oral Daily  . enalapril  10 mg Oral BID  . enoxaparin (LOVENOX) injection  30 mg Subcutaneous Q24H  . fentaNYL  12.5 mcg Transdermal Q72H  . pantoprazole  40 mg Oral Daily  . senna-docusate  1 tablet Oral BID  . sertraline  25 mg Oral QHS   Continuous Infusions: . 0.9 % NaCl with KCl 20 mEq / L 50 mL/hr (09/08/13 1440)    Assessment:  Principal Problem:   Hip fracture, intertrochanteric Active Problems:   Chronic diastolic congestive heart failure   Microcytic anemia   Chronic back pain   Essential hypertension, benign  1. Acute left hip fracture, intertrochanteric, secondary to a fall at the skilled nursing facility. Status post open treatment internal reduction of fracture on 09/09/13 by Dr. Luna Glasgow. Her pain is currently controlled. Morphine IV and oral oxycodone was ordered as needed. We'll decrease the IV morphine slightly to avoid opiate delirium. Fentanyl patch was restarted on 09/09/13 because of increasing pain.  Hypertension. Currently controlled.  We'll continue amlodipine and enalapril.  Chronic low back pain, on chronic transdermal fentanyl.   Chronic diastolic heart failure, clinically compensated.  Acute blood loss anemia, superimposed on Known iron deficiency/microcytic anemia. She is being transfused by Dr. Luna Glasgow for decrease in her hemoglobin to 9.5.  Mild confusion, possibly secondary to opiates, plus/minus mild dementia. The dose of morphine will be decreased from 2 mg to 1 mg every 2 hours as needed. Her TSH and vitamin B12 level were within normal limits.     Plan: 1. Postop wound care, and anticoagulation, and physical therapy recommendations, per Dr. Luna Glasgow. 2. Eventual discharge to skilled nursing facility when okay with Dr. Luna Glasgow.  3. Decrease dose of when necessary morphine.  Time spent: 25 minutes    [facility] LLCFISHER,Madison Devoss  [facility] Pager [phone]. If 7PM-7AM, please contact night-coverage at [email or web address], password [email or web address] 09/10/2013, 1:24 PM  LOS: 3 days

## 2013-09-10 NOTE — Anesthesia Postprocedure Evaluation (Signed)
  Anesthesia Post-op Note  Patient: Madison Hickman  Procedure(s) Performed: Procedure(s): OPEN TREATMENT INTERNAL FIXATION LEFT HIP FRACTURE (Left)  Patient Location:Room 315  Anesthesia Type:Spinal  Level of Consciousness: awake, alert , patient cooperative and confused  Airway and Oxygen Therapy: Patient Spontanous Breathing  Post-op Pain: mild  Post-op Assessment: Post-op Vital signs reviewed, Patient's Cardiovascular Status Stable, Respiratory Function Stable, Patent Airway, No signs of Nausea or vomiting and Pain level controlled  Post-op Vital Signs: Reviewed and stable  Complications: No apparent anesthesia complications

## 2013-09-11 ENCOUNTER — Encounter (HOSPITAL_COMMUNITY): Payer: Self-pay | Admitting: Orthopaedic Surgery

## 2013-09-11 DIAGNOSIS — R1310 Dysphagia, unspecified: Secondary | ICD-10-CM

## 2013-09-11 LAB — TYPE AND SCREEN
ABO/RH(D): A POS
ANTIBODY SCREEN: NEGATIVE
UNIT DIVISION: 0
Unit division: 0

## 2013-09-11 LAB — CBC WITH DIFFERENTIAL/PLATELET
BASOS ABS: 0 10*3/uL (ref 0.0–0.1)
Basophils Relative: 0 % (ref 0–1)
Eosinophils Absolute: 0.1 10*3/uL (ref 0.0–0.7)
Eosinophils Relative: 1 % (ref 0–5)
HCT: 31.3 % — ABNORMAL LOW (ref 36.0–46.0)
Hemoglobin: 10.6 g/dL — ABNORMAL LOW (ref 12.0–15.0)
LYMPHS ABS: 1.1 10*3/uL (ref 0.7–4.0)
Lymphocytes Relative: 13 % (ref 12–46)
MCH: 28.9 pg (ref 26.0–34.0)
MCHC: 33.9 g/dL (ref 30.0–36.0)
MCV: 85.3 fL (ref 78.0–100.0)
Monocytes Absolute: 1 10*3/uL (ref 0.1–1.0)
Monocytes Relative: 11 % (ref 3–12)
NEUTROS PCT: 75 % (ref 43–77)
Neutro Abs: 6.6 10*3/uL (ref 1.7–7.7)
PLATELETS: 200 10*3/uL (ref 150–400)
RBC: 3.67 MIL/uL — AB (ref 3.87–5.11)
RDW: 16.2 % — ABNORMAL HIGH (ref 11.5–15.5)
WBC: 8.7 10*3/uL (ref 4.0–10.5)

## 2013-09-11 LAB — BASIC METABOLIC PANEL
BUN: 15 mg/dL (ref 6–23)
CO2: 23 meq/L (ref 19–32)
Calcium: 9.9 mg/dL (ref 8.4–10.5)
Chloride: 112 mEq/L (ref 96–112)
Creatinine, Ser: 0.54 mg/dL (ref 0.50–1.10)
GFR calc Af Amer: >90 mL/min (ref >90)
GFR, EST NON AFRICAN AMERICAN: 82 mL/min — AB (ref >90)
Glucose, Bld: 96 mg/dL (ref 70–99)
Potassium: 3.5 mEq/L — ABNORMAL LOW (ref 3.7–5.3)
SODIUM: 144 meq/L (ref 137–147)

## 2013-09-11 MED ORDER — POTASSIUM CHLORIDE CRYS ER 20 MEQ PO TBCR
20.0000 meq | EXTENDED_RELEASE_TABLET | Freq: Two times a day (BID) | ORAL | Status: AC
  Administered 2013-09-11 (×2): 20 meq via ORAL
  Filled 2013-09-11 (×2): qty 1

## 2013-09-11 NOTE — Progress Notes (Signed)
The patient was seen and examined. Her chart, vitals signs, and laboratory studies were reviewed. She was discussed with nurse practitioner Ms. Black. Agree with her assessment and findings. Diet has been downgraded to dysphagia 2 as the patient describes some elements of dysphagia. Speech therapy consultation and evaluation has been ordered. Otherwise, the patient is hemodynamically stable and afebrile. Further recommendations will be deferred to Dr. Luna Glasgow, particularly regarding disposition.

## 2013-09-11 NOTE — Progress Notes (Signed)
Subjective: 2 Days Post-Op Procedure(s) (LRB): OPEN TREATMENT INTERNAL FIXATION LEFT HIP FRACTURE (Left) Patient reports pain as 3 on 0-10 scale.    Objective: Vital signs in last 24 hours: Temp:  [97.8 F (36.6 C)-98.6 F (37 C)] 98.3 F (36.8 C) (03/09 0452) Pulse Rate:  [69-107] 69 (03/09 0452) Resp:  [18-20] 20 (03/09 0452) BP: (127-163)/(67-99) 163/82 mmHg (03/09 0452) SpO2:  [95 %-98 %] 95 % (03/09 0740)  Intake/Output from previous day: 03/08 0701 - 03/09 0700 In: 4829.2 [P.O.:600; I.V.:3616.7; Blood:612.5] Out: 975 [Urine:975] Intake/Output this shift:     Recent Labs  09/08/13 2042 09/09/13 0558 09/10/13 0637 09/11/13 0536  HGB 11.0* 11.4* 9.5* 10.6*    Recent Labs  09/10/13 0637 09/11/13 0536  WBC 9.5 8.7  RBC 3.36* 3.67*  HCT 28.9* 31.3*  PLT 206 200    Recent Labs  09/10/13 0637 09/11/13 0536  NA 144 144  K 3.5* 3.5*  CL 110 112  CO2 23 23  BUN 12 15  CREATININE 0.53 0.54  GLUCOSE 126* 96  CALCIUM 10.1 9.9    Recent Labs  09/09/13 0800  INR 1.04    Neurologically intact Neurovascular intact Sensation intact distally Intact pulses distally Dorsiflexion/Plantar flexion intact Incision: scant drainage  Hemovac removed.  Foley to be removed.  PT today.  Possible discharge to SNF tomorrow.  Assessment/Plan: 2 Days Post-Op Procedure(s) (LRB): OPEN TREATMENT INTERNAL FIXATION LEFT HIP FRACTURE (Left) Up with therapy    Madison Hickman 09/11/2013, 7:43 AM

## 2013-09-11 NOTE — Progress Notes (Signed)
Physical Therapy Treatment Patient Details Name: SHAREKA CASALE MRN: [id number] DOB: June 05, 1924 Today's Date: 09/11/2013 Time: 4287-6811 PT Time Calculation (min): 31 min  PT Assessment / Plan / Recommendation  History of Present Illness     PT Comments   Pt limited by Lt LE pain with movements.  Pt able to complete bed exercises with AAROM for Lt LE, AROM for Rt.  Mod assistance with bed mobility for supine to sit.  Mod assistance required with scooting to OOB with cueing for hand placement to assist.  Good seated balance noted, no assistance required to sit on OOB.  Pt declined transfer to chair following education of benefits for functional strengthening and reduction of further problems like pneumonia staying in bed, pt stated increased Lt LE pain and wishes to stay in bed.    Follow Up Recommendations        Does the patient have the potential to tolerate intense rehabilitation     Barriers to Discharge        Equipment Recommendations       Recommendations for Other Services    Frequency     Progress towards PT Goals Progress towards PT goals: Progressing toward goals  Plan      Precautions / Restrictions Precautions Precautions: Fall Restrictions Weight Bearing Restrictions: Yes Other Position/Activity Restrictions: Toe touch weight bearing on left LE   Pertinent Vitals/Pain Increased Lt LE pain with bed mobility pain scale 2-3/10 at rest.      Mobility  Bed Mobility Overal bed mobility: Needs Assistance Bed Mobility: Supine to Sit Supine to sit: Mod assist General bed mobility comments: Assistance required with Lt LE with supine to sitting on OOB    Exercises Total Joint Exercises Ankle Circles/Pumps: AROM;Both;10 reps;Supine Quad Sets: AROM;Both;10 reps;Supine Gluteal Sets: AROM;Both;10 reps;Supine Short Arc Quad: AAROM;Both;10 reps;Supine Heel Slides: AAROM;Both;10 reps;Supine   PT Diagnosis:    PT Problem List:   PT Treatment Interventions:     PT Goals  (current goals can now be found in the care plan section)    Visit Information  Last PT Received On: 09/11/13    Subjective Data   My leg is feeling pretty good when I don't move it, have been doing lots of ankle pumps in bed.     Cognition  Cognition Arousal/Alertness: Awake/alert Behavior During Therapy: Flat affect Overall Cognitive Status: Difficult to assess Memory: Decreased short-term memory    Balance     End of Session PT - End of Session Equipment Utilized During Treatment: Gait belt Activity Tolerance: Patient limited by pain Patient left: in bed;with call bell/phone within reach;with bed alarm set Nurse Communication: Mobility status;Weight bearing status   GP     Aldona Lento 09/11/2013, 10:36 AM

## 2013-09-11 NOTE — Progress Notes (Signed)
[facility] PROGRESS NOTE  Madison Hickman ERD:[id number] DOB: June 12, 1924 DOA: 09/07/2013 PCP: Jani Gravel, MD  Assessment/Plan: 1. Acute left hip fracture, intertrochanteric, secondary to a fall at the skilled nursing facility. Status post open treatment internal reduction of fracture on 09/09/13 by Dr. Luna Glasgow.  Morphine IV and oral oxycodone was ordered as needed. IV morphine discontinued as pain managed with po. Continue  Fentanyl patch.  Hemovac and foley removed per ortho. PT today.   Hypertension. Only fair control. Pain likely factor.  We'll continue amlodipine and enalapril. Will monitor closely  Chronic low back pain, on chronic transdermal fentanyl.   Chronic diastolic heart failure, clinically compensated. Volume status +2L. Will discontinue IV fluids as patient taking po without problem.   Acute blood loss anemia, superimposed on Known iron deficiency/microcytic anemia. S/P 1 unit PRBC's. Hg 10.6.    Mild confusion, possibly secondary to opiates, plus/minus mild dementia. Appears to be at baseline. Morphine discontinued.  Her TSH and vitamin B12 level were within normal limits.   Dysphagia: patient complains of "choking on saliva". Reports frequent episodes "choking". Will request speech therapy evaluation.   Hypokalemia: mild. Will replete and recheck.    Code Status: DNR Family Communication: none present Disposition Plan: snf hopefully tomorrow   Consultants:  Dr. Luna Glasgow orthopedics  Procedures: Open treatment internal reduction of left hip intertrochanteric fracture, Dr. Luna Glasgow, 09/09/13.   Antibiotics:  Cefazolin IV 09/09/13 x1.  HPI/Subjective: Sitting up in bed. Reports pain controlled. Complained of "choking on own saliva".  Objective: Filed Vitals:   09/11/13 0452  BP: 163/82  Pulse: 69  Temp: 98.3 F (36.8 C)  Resp: 20    Intake/Output Summary (Last 24 hours) at 09/11/13 0949 Last data filed at 09/11/13 0500  Gross per 24 hour  Intake  4589.17 ml  Output    975 ml  Net 3614.17 ml   Filed Weights   09/07/13 1428 09/07/13 1706 09/09/13 0540  Weight: 40.824 kg (90 lb) 43.5 kg (95 lb 14.4 oz) 43.2 kg (95 lb 3.8 oz)    Exam:   General:  Thin somewhat frail NAD  Cardiovascular: S1 and S2 no gallup or rub. No LE edema  Respiratory: normal effort BS slightly distant particularly in bases. No wheeze   Abdomen: flat soft +BS non-tender to palpation   Musculoskeletal: dressing left hip dry and intact. Left foot warm and dry.    Data Reviewed: Basic Metabolic Panel:  Recent Labs Lab 09/07/13 1448 09/08/13 0525 09/09/13 0558 09/10/13 0637 09/11/13 0536  NA 142 143 143 144 144  K 4.1 3.7 3.7 3.5* 3.5*  CL 107 110 108 110 112  CO2 24 23 23 23 23   GLUCOSE 181* 114* 102* 126* 96  BUN 16 12 8 12 15   CREATININE 0.82 0.60 0.54 0.53 0.54  CALCIUM 10.4 9.7 10.4 10.1 9.9   Liver Function Tests:  Recent Labs Lab 09/08/13 0525  AST 15  ALT 8  ALKPHOS 51  BILITOT 0.5  PROT 5.7*  ALBUMIN 3.0*   No results found for this basename: LIPASE, AMYLASE,  in the last 168 hours No results found for this basename: AMMONIA,  in the last 168 hours CBC:  Recent Labs Lab 09/07/13 1448 09/08/13 0525 09/08/13 2042 09/09/13 0558 09/10/13 0637 09/11/13 0536  WBC 11.9* 9.1 8.0 9.7 9.5 8.7  NEUTROABS 10.2*  --  5.9  --  7.7 6.6  HGB 10.7* 9.7* 11.0* 11.4* 9.5* 10.6*  HCT 33.0* 29.8* 32.9* 34.5* 28.9* 31.3*  MCV  86.4 86.6 86.4 86.0 86.0 85.3  PLT 284 251 215 227 206 200   Cardiac Enzymes: No results found for this basename: CKTOTAL, CKMB, CKMBINDEX, TROPONINI,  in the last 168 hours BNP (last 3 results)  Recent Labs  11/04/12 1245 11/10/12 1726  PROBNP 1050.0* 1130.0*   CBG: No results found for this basename: GLUCAP,  in the last 168 hours  Recent Results (from the past 240 hour(s))  SURGICAL PCR SCREEN     Status: None   Collection Time    09/08/13  7:00 PM      Result Value Ref Range Status   MRSA, PCR  NEGATIVE  NEGATIVE Final   Staphylococcus aureus NEGATIVE  NEGATIVE Final   Comment:            The Xpert SA Assay (FDA     approved for NASAL specimens     in patients over 69 years of age),     is one component of     a comprehensive surveillance     program.  Test performance has     been validated by [employer] for patients greater     than or equal to 43 year old.     It is not intended     to diagnose infection nor to     guide or monitor treatment.     Studies: Dg Hip Complete Left  09/09/2013   CLINICAL DATA:  Status post ORIF of left femur  EXAM: LEFT HIP - COMPLETE 2+ VIEW  COMPARISON:  09/07/2013  FINDINGS: Three images from C-arm radiography obtained in the operating room show changes from ORIF of intertrochanteric fracture of proximal left femur. There is a dynamic hip screw and sideplate device which reduces the fracture.  IMPRESSION: 1. Status post ORIF of left proximal femur fracture.   Electronically Signed   By: Kerby Moors M.D.   On: 09/09/2013 11:43   Dg C-arm 1-60 Min  09/09/2013   CLINICAL DATA:  Internal fixation.  Left hip fracture.  EXAM: DG C-ARM 1-60 MIN  FINDINGS: Three intraoperative spot images demonstrate placement dynamic hip screw and lateral plate fixation device across the left femoral intertrochanteric fracture. Anatomic alignment. No hardware or bony complicating feature noted on these intraoperative spot images.  IMPRESSION: Internal fixation across the left femoral intertrochanteric fracture. No visible complicating feature.  : COMPARISON:  09/07/2013   Electronically Signed   By: Rolm Baptise M.D.   On: 09/09/2013 14:18    Scheduled Meds: . albuterol  2.5 mg Nebulization Q6H  . amLODipine  5 mg Oral Daily  . enalapril  10 mg Oral BID  . enoxaparin (LOVENOX) injection  30 mg Subcutaneous Q24H  . fentaNYL  12.5 mcg Transdermal Q72H  . pantoprazole  40 mg Oral Daily  . potassium chloride  20 mEq Oral BID  . senna-docusate  1 tablet Oral BID   . sertraline  25 mg Oral QHS   Continuous Infusions: . 0.9 % NaCl with KCl 20 mEq / L 50 mL/hr at 09/10/13 1659    Principal Problem:   Hip fracture, intertrochanteric Active Problems:   Chronic diastolic congestive heart failure   Microcytic anemia   Chronic back pain   Essential hypertension, benign    Time spent: 30 minutes    [facility] Pager [phone]. If 7PM-7AM, please contact night-coverage at [email or web address], password [facility] 09/11/2013, 9:49 AM  LOS: 4 days

## 2013-09-12 ENCOUNTER — Inpatient Hospital Stay (HOSPITAL_COMMUNITY): Payer: Medicare Other

## 2013-09-12 DIAGNOSIS — E43 Unspecified severe protein-calorie malnutrition: Secondary | ICD-10-CM | POA: Diagnosis present

## 2013-09-12 DIAGNOSIS — R1310 Dysphagia, unspecified: Secondary | ICD-10-CM | POA: Diagnosis present

## 2013-09-12 LAB — BASIC METABOLIC PANEL
BUN: 16 mg/dL (ref 6–23)
CHLORIDE: 114 meq/L — AB (ref 96–112)
CO2: 22 meq/L (ref 19–32)
Calcium: 10.3 mg/dL (ref 8.4–10.5)
Creatinine, Ser: 0.52 mg/dL (ref 0.50–1.10)
GFR calc Af Amer: >90 mL/min (ref >90)
GFR calc non Af Amer: 83 mL/min — ABNORMAL LOW (ref >90)
GLUCOSE: 88 mg/dL (ref 70–99)
POTASSIUM: 4.6 meq/L (ref 3.7–5.3)
SODIUM: 146 meq/L (ref 137–147)

## 2013-09-12 LAB — CBC WITH DIFFERENTIAL/PLATELET
Basophils Absolute: 0 10*3/uL (ref 0.0–0.1)
Basophils Relative: 0 % (ref 0–1)
Eosinophils Absolute: 0.4 10*3/uL (ref 0.0–0.7)
Eosinophils Relative: 5 % (ref 0–5)
HCT: 31.3 % — ABNORMAL LOW (ref 36.0–46.0)
Hemoglobin: 10.5 g/dL — ABNORMAL LOW (ref 12.0–15.0)
LYMPHS ABS: 1 10*3/uL (ref 0.7–4.0)
LYMPHS PCT: 14 % (ref 12–46)
MCH: 29 pg (ref 26.0–34.0)
MCHC: 33.5 g/dL (ref 30.0–36.0)
MCV: 86.5 fL (ref 78.0–100.0)
MONOS PCT: 9 % (ref 3–12)
Monocytes Absolute: 0.7 10*3/uL (ref 0.1–1.0)
NEUTROS PCT: 72 % (ref 43–77)
Neutro Abs: 5.1 10*3/uL (ref 1.7–7.7)
PLATELETS: 213 10*3/uL (ref 150–400)
RBC: 3.62 MIL/uL — AB (ref 3.87–5.11)
RDW: 16.5 % — ABNORMAL HIGH (ref 11.5–15.5)
WBC: 7.2 10*3/uL (ref 4.0–10.5)

## 2013-09-12 MED ORDER — ENSURE COMPLETE PO LIQD
237.0000 mL | Freq: Two times a day (BID) | ORAL | Status: DC
  Administered 2013-09-12 – 2013-09-13 (×2): 237 mL via ORAL

## 2013-09-12 MED ORDER — FENTANYL 12 MCG/HR TD PT72: 12.5000 ug | MEDICATED_PATCH | TRANSDERMAL | Status: AC

## 2013-09-12 MED ORDER — MAGNESIUM HYDROXIDE 400 MG/5ML PO SUSP: 30.0000 mL | Freq: Every day | ORAL | Status: DC | PRN

## 2013-09-12 MED ORDER — ENOXAPARIN SODIUM 30 MG/0.3ML ~~LOC~~ SOLN: 30.0000 mg | SUBCUTANEOUS | Status: DC

## 2013-09-12 MED ORDER — OXYCODONE-ACETAMINOPHEN 5-325 MG PO TABS: 1.0000 | ORAL_TABLET | ORAL | Status: AC | PRN

## 2013-09-12 MED ORDER — ENSURE COMPLETE PO LIQD: 237.0000 mL | Freq: Two times a day (BID) | ORAL | Status: AC

## 2013-09-12 NOTE — Progress Notes (Signed)
Subjective: 3 Days Post-Op Procedure(s) (LRB): OPEN TREATMENT INTERNAL FIXATION LEFT HIP FRACTURE (Left) Patient reports pain as mild.    Objective: Vital signs in last 24 hours: Temp:  [98.1 F (36.7 C)-98.4 F (36.9 C)] 98.1 F (36.7 C) (03/10 0513) Pulse Rate:  [63-71] 66 (03/10 0513) Resp:  [18-20] 18 (03/10 0513) BP: (140-163)/(73-83) 140/73 mmHg (03/10 0513) SpO2:  [93 %-98 %] 94 % (03/10 0704)  Intake/Output from previous day: 03/09 0701 - 03/10 0700 In: 610 [P.O.:360; I.V.:250] Out: 800 [Urine:800] Intake/Output this shift:     Recent Labs  09/10/13 0637 09/11/13 0536 09/12/13 0523  HGB 9.5* 10.6* 10.5*    Recent Labs  09/11/13 0536 09/12/13 0523  WBC 8.7 7.2  RBC 3.67* 3.62*  HCT 31.3* 31.3*  PLT 200 213    Recent Labs  09/11/13 0536 09/12/13 0523  NA 144 146  K 3.5* 4.6  CL 112 114*  CO2 23 22  BUN 15 16  CREATININE 0.54 0.52  GLUCOSE 96 88  CALCIUM 9.9 10.3   No results found for this basename: LABPT, INR,  in the last 72 hours  Neurologically intact Neurovascular intact Sensation intact distally Intact pulses distally Dorsiflexion/Plantar flexion intact Incision: scant drainage  She has made slow progress with therapy.  She will need SNF.  I feel she should be ready for discharge tomorrow.  She will need staples removed next Wednesday, March 18th. She will need to continue the enoxaparin for one month. She will need toe touch weight bearing with walker. I will need to see in my office in one month with x-rays of the left hip prior to the appointment.  Assessment/Plan: 3 Days Post-Op Procedure(s) (LRB): OPEN TREATMENT INTERNAL FIXATION LEFT HIP FRACTURE (Left) Up with therapy Plan for discharge tomorrow  Madison Hickman 09/12/2013, 8:16 AM

## 2013-09-12 NOTE — Discharge Summary (Signed)
Physician Discharge Summary  Madison Hickman ZDG:[id number] DOB: 1923-08-22 DOA: 09/07/2013  PCP: Jani Gravel, MD  Admit date: 09/07/2013 Discharge date: 09/12/2013  Time spent: 40 minutes  Recommendations for Outpatient Follow-up:  1. Follow up with Dr. Luna Glasgow 1 month with xrays. Remove staples 09/20/13.  2. Physical therapy with toe touch weight bearing with walker  Discharge Diagnoses:  Principal Problem:   Hip fracture, intertrochanteric Active Problems:   Chronic diastolic congestive heart failure   Microcytic anemia   Chronic back pain   Dysphagia   Essential hypertension, benign   Protein-calorie malnutrition, severe   Discharge Condition: stable  Diet recommendation:   Filed Weights   09/07/13 1428 09/07/13 1706 09/09/13 0540  Weight: 40.824 kg (90 lb) 43.5 kg (95 lb 14.4 oz) 43.2 kg (95 lb 3.8 oz)    History of present illness:  This pleasant 80 year old lady presented to the hospital having suffered a fall and she has now fractured her left hip. She denied any loss of consciousness. There has been no head injury.She denied any chest pain, dyspnea or palpitations at the present time nor did she have any prior to fall.   Hospital Course:   Acute left hip fracture, intertrochanteric, secondary to a fall at the skilled nursing facility. Status post open treatment internal reduction of fracture on 09/09/13 by Dr. Luna Glasgow. Pain managed with oral oxycodone and fentanyl patch. Has been up with PT. Will discharge to facility with follow up Dr Luna Glasgow 1 months. Staples to be removed 09/20/13. Toe touch weight bearing with walker.   Hypertension. Fair control. Pain likely factor. We'll continue amlodipine and enalapril.   Chronic low back pain, on chronic transdermal fentanyl.   Chronic diastolic heart failure, clinically compensated. Volume status +2L.   Acute blood loss anemia, superimposed on Known iron deficiency/microcytic anemia. S/P 1 unit PRBC's. Hg 10.6.   Mild confusion,  possibly secondary to opiates, plus/minus mild dementia.  Resolved at discharge and appears to be at baseline. Her TSH and vitamin B12 level were within normal limits. Narcotics reduced  Dysphagia: patient complains of "choking on saliva". Reports frequent episodes "choking". Will request speech therapy evaluation.  Hypokalemia: resolved at discharge.      Procedures:  Open treatment internal reduction of left hip intertrochanteric fracture, Dr. Luna Glasgow, 09/09/13.    Barium swallow 09/12/13  Consultations:  Dr. Luna Glasgow orthopedics  Discharge Exam: Filed Vitals:   09/12/13 0513  BP: 140/73  Pulse: 66  Temp: 98.1 F (36.7 C)  Resp: 18    General: thin somewhat frail NAD Cardiovascular: S1 and S2. No gallup or rub No LE edema PPP Respiratory: normal effort BS clear bilaterally no wheeze or rhonchi Musculoskeletal: dressing left hip dry and intact. Left foot warm  Discharge Instructions     Medication List         amLODipine 5 MG tablet  Commonly known as:  NORVASC  Take 1 tablet (5 mg total) by mouth daily. For blood pressure     aspirin EC 81 MG tablet  Take 81 mg by mouth daily.     calcium carbonate 500 MG chewable tablet  Commonly known as:  TUMS - dosed in mg elemental calcium  Chew 2 tablets by mouth every 8 (eight) hours as needed for indigestion or heartburn.     docusate sodium 100 MG capsule  Commonly known as:  COLACE  Take 100 mg by mouth daily as needed for constipation.     enalapril 10 MG tablet  Commonly known  as:  VASOTEC  Take 1 tablet (10 mg total) by mouth 2 (two) times daily.     enoxaparin 30 MG/0.3ML injection  Commonly known as:  LOVENOX  Inject 0.3 mLs (30 mg total) into the skin daily.     eucerin cream  Apply 1 application topically as needed for dry skin.     feeding supplement (ENSURE COMPLETE) Liqd  Take 237 mLs by mouth 2 (two) times daily between meals.     fentaNYL 12 MCG/HR  Commonly known as:  DURAGESIC - dosed mcg/hr   Place 1 patch (12.5 mcg total) onto the skin every 3 (three) days.     lidocaine 5 %  Commonly known as:  LIDODERM  Place 1 patch onto the skin daily. Remove & Discard patch within 12 hours or as directed by MD     LORazepam 0.5 MG tablet  Commonly known as:  ATIVAN  Take 0.25 mg by mouth every 6 (six) hours as needed for anxiety.     magnesium hydroxide 400 MG/5ML suspension  Commonly known as:  MILK OF MAGNESIA  Take 30 mLs by mouth daily as needed for mild constipation.     nitroGLYCERIN 0.4 MG SL tablet  Commonly known as:  NITROSTAT  Place 1 tablet (0.4 mg total) under the tongue every 5 (five) minutes as needed for chest pain.     oxyCODONE-acetaminophen 5-325 MG per tablet  Commonly known as:  PERCOCET/ROXICET  Take 1 tablet by mouth every 4 (four) hours as needed for moderate pain or severe pain.     pantoprazole 40 MG tablet  Commonly known as:  PROTONIX  Take 1 tablet (40 mg total) by mouth daily. 30 minutes before breakfast for GERD     sennosides-docusate sodium 8.6-50 MG tablet  Commonly known as:  SENOKOT-S  Take 1 tablet by mouth 2 (two) times daily.     ZOLOFT 25 MG tablet  Generic drug:  sertraline  Take 25 mg by mouth at bedtime.       No Known Allergies     Follow-up Information   Follow up with Sanjuana Kava, MD In 4 weeks. (for follow up and xrays)    Specialty:  Orthopedic Surgery   Contact information:   [address] [address] [phone]        The results of significant diagnostics from this hospitalization (including imaging, microbiology, ancillary and laboratory) are listed below for reference.    Significant Diagnostic Studies: Dg Chest 1 View  09/07/2013   CLINICAL DATA:  Fall.  Hip pain  EXAM: CHEST - 1 VIEW  COMPARISON:  03/19/2013  FINDINGS: Cardiac enlargement.  Thoracic aortic aneurysm with tortuosity.  Negative for heart failure. Negative for infiltrate effusion or mass.  IMPRESSION: Cardiac enlargement.   No acute abnormality.   Electronically Signed   By: Franchot Gallo M.D.   On: 09/07/2013 15:10   Dg Hip Complete Left  09/09/2013   CLINICAL DATA:  Status post ORIF of left femur  EXAM: LEFT HIP - COMPLETE 2+ VIEW  COMPARISON:  09/07/2013  FINDINGS: Three images from C-arm radiography obtained in the operating room show changes from ORIF of intertrochanteric fracture of proximal left femur. There is a dynamic hip screw and sideplate device which reduces the fracture.  IMPRESSION: 1. Status post ORIF of left proximal femur fracture.   Electronically Signed   By: Kerby Moors M.D.   On: 09/09/2013 11:43   Dg Hip Complete Left  09/07/2013  CLINICAL DATA:  Fall  EXAM: LEFT HIP - COMPLETE 2+ VIEW  COMPARISON:  None.  FINDINGS: Intertrochanteric fracture of the left femur with angulation. Left hip joint is satisfactory.  Pessary is present in the vagina.  IMPRESSION: Angulated intertrochanteric fracture left femur   Electronically Signed   By: Franchot Gallo M.D.   On: 09/07/2013 15:13   Dg C-arm 1-60 Min  09/09/2013   CLINICAL DATA:  Internal fixation.  Left hip fracture.  EXAM: DG C-ARM 1-60 MIN  FINDINGS: Three intraoperative spot images demonstrate placement dynamic hip screw and lateral plate fixation device across the left femoral intertrochanteric fracture. Anatomic alignment. No hardware or bony complicating feature noted on these intraoperative spot images.  IMPRESSION: Internal fixation across the left femoral intertrochanteric fracture. No visible complicating feature.  : COMPARISON:  09/07/2013   Electronically Signed   By: Rolm Baptise M.D.   On: 09/09/2013 14:18    Microbiology: Recent Results (from the past 240 hour(s))  SURGICAL PCR SCREEN     Status: None   Collection Time    09/08/13  7:00 PM      Result Value Ref Range Status   MRSA, PCR NEGATIVE  NEGATIVE Final   Staphylococcus aureus NEGATIVE  NEGATIVE Final   Comment:            The Xpert SA Assay (FDA     approved for NASAL  specimens     in patients over 43 years of age),     is one component of     a comprehensive surveillance     program.  Test performance has     been validated by [employer] for patients greater     than or equal to 74 year old.     It is not intended     to diagnose infection nor to     guide or monitor treatment.     Labs: Basic Metabolic Panel:  Recent Labs Lab 09/08/13 0525 09/09/13 0558 09/10/13 0637 09/11/13 0536 09/12/13 0523  NA 143 143 144 144 146  K 3.7 3.7 3.5* 3.5* 4.6  CL 110 108 110 112 114*  CO2 23 23 23 23 22   GLUCOSE 114* 102* 126* 96 88  BUN 12 8 12 15 16   CREATININE 0.60 0.54 0.53 0.54 0.52  CALCIUM 9.7 10.4 10.1 9.9 10.3   Liver Function Tests:  Recent Labs Lab 09/08/13 0525  AST 15  ALT 8  ALKPHOS 51  BILITOT 0.5  PROT 5.7*  ALBUMIN 3.0*   No results found for this basename: LIPASE, AMYLASE,  in the last 168 hours No results found for this basename: AMMONIA,  in the last 168 hours CBC:  Recent Labs Lab 09/07/13 1448  09/08/13 2042 09/09/13 0558 09/10/13 0637 09/11/13 0536 09/12/13 0523  WBC 11.9*  < > 8.0 9.7 9.5 8.7 7.2  NEUTROABS 10.2*  --  5.9  --  7.7 6.6 5.1  HGB 10.7*  < > 11.0* 11.4* 9.5* 10.6* 10.5*  HCT 33.0*  < > 32.9* 34.5* 28.9* 31.3* 31.3*  MCV 86.4  < > 86.4 86.0 86.0 85.3 86.5  PLT 284  < > 215 227 206 200 213  < > = values in this interval not displayed. Cardiac Enzymes: No results found for this basename: CKTOTAL, CKMB, CKMBINDEX, TROPONINI,  in the last 168 hours BNP: BNP (last 3 results)  Recent Labs  11/04/12 1245 11/10/12 1726  PROBNP 1050.0* 1130.0*   CBG:  No results found for this basename: GLUCAP,  in the last 168 hours     Signed:  Radene Gunning  [facility] 09/12/2013, 2:49 PM

## 2013-09-12 NOTE — Progress Notes (Signed)
Physical Therapy Treatment Patient Details Name: Madison Hickman MRN: [id number] DOB: [birth year] Today's Date: 09/12/2013 Time: 8338-2505 PT Time Calculation (min): 24 min  PT Assessment / Plan / Recommendation  History of Present Illness pt reports soreness "all over" but nothing specific   PT Comments   Pt was agreeable to work with me today but reluctantly so.  Her generalized soreness is most likely from prolonged bedrest (she refused getting OOB yesterday).  She tolerated therapeutic exercise well but has minimal hip flexion left with heel slide.  She was able to assist a bit more with transfer from supine to sit.  I attempted to have her stand at EOB with a walker but she said that she didn't feel ready for this yet.  She needed mod assist with a pivot transfer bed to chair.  Follow Up Recommendations   SNF                              Progress towards PT Goals Progress towards PT goals: Progressing toward goals  Plan Current plan remains appropriate    Precautions / Restrictions Precautions Precautions: Fall Restrictions Weight Bearing Restrictions: Yes LLE Weight Bearing: Touchdown weight bearing   \    Mobility  Bed Mobility Overal bed mobility: Needs Assistance Bed Mobility: Supine to Sit Supine to sit: Mod assist Transfers Overall transfer level: Needs assistance Equipment used: None Transfers: Stand Pivot Transfers Stand pivot transfers: Max assist General transfer comment: pt is able to weight bear on the RLE and pivot to chair    Exercises General Exercises - Lower Extremity Ankle Circles/Pumps: AROM;Both;10 reps;Supine Quad Sets: AROM;Both;10 reps;Supine Gluteal Sets: AROM;Both;10 reps;Supine Heel Slides: AAROM;Both;10 reps;Supine Hip ABduction/ADduction: AAROM;Both;10 reps;Supine   PT Goals (current goals can now be found in the care plan section)    Visit Information  Last PT Received On: 09/12/13 History of Present Illness: pt reports soreness  "all over" but nothing specific         Cognition  Cognition Arousal/Alertness: Awake/alert Behavior During Therapy: WFL for tasks assessed/performed Overall Cognitive Status: History of cognitive impairments - at baseline    Balance  Balance Sitting-balance support: Feet supported;Bilateral upper extremity supported Sitting balance-Leahy Scale: Fair Sitting balance - Comments: pt continues to avoid weight bearing on the left hip  End of Session PT - End of Session Equipment Utilized During Treatment: Gait belt Activity Tolerance: Patient tolerated treatment well Patient left: in chair;with call bell/phone within reach;with chair alarm set   GP     Demetrios Isaacs L 09/12/2013, 11:52 AM

## 2013-09-12 NOTE — Care Management Note (Signed)
    Page 1 of 1   09/12/2013     2:41:07 PM   CARE MANAGEMENT NOTE 09/12/2013  Patient:  Madison Hickman, Madison Hickman   Account Number:  [id number]  Date Initiated:  09/12/2013  Documentation initiated by:  Claretha Cooper  Subjective/Objective Assessment:   DCing to [facility] with CSW assistance     Action/Plan:   Anticipated DC Date:  09/12/2013   Anticipated DC Plan:  [place]  In-house referral  Clinical Social Worker         Choice offered to / List presented to:             Status of service:  Completed, signed off Medicare Important Message given?   (If response is "NO", the following Medicare IM given date fields will be blank) Date Medicare IM given:   Date Additional Medicare IM given:    Discharge Disposition:  [place]  Per UR Regulation:    If discussed at Long Length of Stay Meetings, dates discussed:   09/12/2013    Comments:

## 2013-09-12 NOTE — Progress Notes (Signed)
The patient was seen and examined. Her chart, vital signs, and laboratory studies were reviewed. She was discussed with nurse practitioner, Ms. Renard Hamper. Agree with her findings and plan. The patient appears to be stable from an orthopedic standpoint. However, she has symptomatology of dysphagia and no diet aphasia This afternoon, the speech therapist that she had air in her esophagus and the test pill did not pass through during the MBS. Therefore, the speech therapist recommended a pured diet with thin liquids. GI will be consulted for evaluation. The patient may need an EGD since there is a suspicion of stricture. This will be deferred to GI.

## 2013-09-12 NOTE — Progress Notes (Signed)
[facility] PROGRESS NOTE  Madison Hickman GGY:[id number] DOB: [birth year] DOA: 09/07/2013 PCP: Jani Gravel, MD  Assessment/Plan: 1. Acute left hip fracture, intertrochanteric, secondary to a fall at the skilled nursing facility. Status post open treatment internal reduction of fracture on 09/09/13 by Dr. Luna Glasgow. Pain managed with po medicine. Continue Fentanyl patch. Ready for discharge tomorrow per ortho   Hypertension.Fair control. Pain likely factor. We'll continue amlodipine and enalapril. Will monitor closely   Chronic low back pain, on chronic transdermal fentanyl.   Chronic diastolic heart failure, clinically compensated. Volume status +2L.    Acute blood loss anemia, superimposed on Known iron deficiency/microcytic anemia. S/P 1 unit PRBC's. Hg 10.5.   Mild confusion, possibly secondary to opiates, plus/minus mild dementia. Resolved today.  Appears to be at baseline. Morphine discontinued. Her TSH and vitamin B12 level were within normal limits. Reduce narcotic dose  Odynophagia/Dysphagia: Modified barium swallow results concerning for esophageal stricture. Will request GI consult.   Hypokalemia: mild. Will replete and recheck.     Code Status: DNR Family Communication: none present Disposition Plan: snf hopefully tomorrow   Consultants:  GI  orthopedics  Procedures:  Barium swallow 09/12/13  Open treatment interrnal reduction of left hip intertrochanteric fracture 09/09/13  Antibiotics:  Cefazolin IV 09/09/13 x1.   HPI/Subjective: Lying in bed reporting left hip pain.  Objective: Filed Vitals:   09/12/13 1430  BP: 122/94  Pulse: 74  Temp: 98.1 F (36.7 C)  Resp: 18    Intake/Output Summary (Last 24 hours) at 09/12/13 1622 Last data filed at 09/12/13 0800  Gross per 24 hour  Intake    120 ml  Output    700 ml  Net   -580 ml   Filed Weights   09/07/13 1428 09/07/13 1706 09/09/13 0540  Weight: 40.824 kg (90 lb) 43.5 kg (95 lb 14.4 oz) 43.2 kg (95 lb  3.8 oz)    Exam:   General:  Thin frail NAD  Cardiovascular: S1 and S2 with no gallup or rub noted. No LE edema  Respiratory: normal effort BS clear bilaterally no wheeze  Abdomen: soft +BS non-tender to palpation  Musculoskeletal: dressing left hip dry and intact. Tender to touch. Left foot warm   Data Reviewed: Basic Metabolic Panel:  Recent Labs Lab 09/08/13 0525 09/09/13 0558 09/10/13 0637 09/11/13 0536 09/12/13 0523  NA 143 143 144 144 146  K 3.7 3.7 3.5* 3.5* 4.6  CL 110 108 110 112 114*  CO2 23 23 23 23 22   GLUCOSE 114* 102* 126* 96 88  BUN 12 8 12 15 16   CREATININE 0.60 0.54 0.53 0.54 0.52  CALCIUM 9.7 10.4 10.1 9.9 10.3   Liver Function Tests:  Recent Labs Lab 09/08/13 0525  AST 15  ALT 8  ALKPHOS 51  BILITOT 0.5  PROT 5.7*  ALBUMIN 3.0*   No results found for this basename: LIPASE, AMYLASE,  in the last 168 hours No results found for this basename: AMMONIA,  in the last 168 hours CBC:  Recent Labs Lab 09/07/13 1448  09/08/13 2042 09/09/13 0558 09/10/13 0637 09/11/13 0536 09/12/13 0523  WBC 11.9*  < > 8.0 9.7 9.5 8.7 7.2  NEUTROABS 10.2*  --  5.9  --  7.7 6.6 5.1  HGB 10.7*  < > 11.0* 11.4* 9.5* 10.6* 10.5*  HCT 33.0*  < > 32.9* 34.5* 28.9* 31.3* 31.3*  MCV 86.4  < > 86.4 86.0 86.0 85.3 86.5  PLT 284  < > 215 227 206 200  213  < > = values in this interval not displayed. Cardiac Enzymes: No results found for this basename: CKTOTAL, CKMB, CKMBINDEX, TROPONINI,  in the last 168 hours BNP (last 3 results)  Recent Labs  11/04/12 1245 11/10/12 1726  PROBNP 1050.0* 1130.0*   CBG: No results found for this basename: GLUCAP,  in the last 168 hours  Recent Results (from the past 240 hour(s))  SURGICAL PCR SCREEN     Status: None   Collection Time    09/08/13  7:00 PM      Result Value Ref Range Status   MRSA, PCR NEGATIVE  NEGATIVE Final   Staphylococcus aureus NEGATIVE  NEGATIVE Final   Comment:            The Xpert SA Assay (FDA      approved for NASAL specimens     in patients over 51 years of age),     is one component of     a comprehensive surveillance     program.  Test performance has     been validated by [employer] for patients greater     than or equal to 41 year old.     It is not intended     to diagnose infection nor to     guide or monitor treatment.     Studies: No results found.  Scheduled Meds: . amLODipine  5 mg Oral Daily  . enalapril  10 mg Oral BID  . enoxaparin (LOVENOX) injection  30 mg Subcutaneous Q24H  . feeding supplement (ENSURE COMPLETE)  237 mL Oral BID BM  . fentaNYL  12.5 mcg Transdermal Q72H  . pantoprazole  40 mg Oral Daily  . senna-docusate  1 tablet Oral BID  . sertraline  25 mg Oral QHS   Continuous Infusions:   Principal Problem:   Hip fracture, intertrochanteric Active Problems:   Chronic diastolic congestive heart failure   Microcytic anemia   Chronic back pain   Dysphagia   Essential hypertension, benign   Protein-calorie malnutrition, severe   Odynophagia    Time spent: 30 minutes    [facility] Pager [phone]. If 7PM-7AM, please contact night-coverage at [email or web address], password [facility] 09/12/2013, 4:22 PM  LOS: 5 days

## 2013-09-12 NOTE — Progress Notes (Signed)
INITIAL NUTRITION ASSESSMENT  Pt meets criteria for SEVERE malnutrition in the context of chronic illness as evidenced by severe body fat and muscle mass loss.  DOCUMENTATION CODES Per approved criteria  -Severe malnutrition in the context of chronic illness -Underweight   INTERVENTION: Provide Ensure Complete BID between meals, each container provides 350 kcals and 13 grams of protein.  SLP diet recommendations per MBS evaluation.  NUTRITION DIAGNOSIS: Inadequate oral intake related to poor appetite, altered GI function as evidenced by meal completion 25-50%.  Goal: Pt to meet >/= 90 % of their estimated nutrition needs.  Monitor:  PO & supplement intake, weight trends, labs, I/O's, SLP diet recommendations  Reason for Assessment: Length of stay  80 y.o. female  Admitting Dx: Hip fracture, intertrochanteric  ASSESSMENT: 80 y.o. female patient at [facility] with PMH of hypertension, arthritis, iron deficiency anemia, CHF, and meningioma who arrives by EMS presents to the ER complaining of fall and hip pain. Pt denies head injury and syncope. Pt fell onto her left side injuring her left hip.   Procedure: 3/7-OPEN TREATMENT INTERNAL FIXATION LEFT HIP FRACTURE (Left)  Pt reports having stomach pains causing her to have a decreased appetite. Meal completion has been 25-50%. Pt reports even though she has been having a decreased appetite she has been making herself eat. Pt reports she has had weight loss from her decreased appetite. Pt reports her usual body weight is 120 lbs, but reports that was year ago. Pt usually drinks Ensure. Pt is willing to try Ensure--will order.   Per SLP, pt will have a  MBS evaluation today to access pt altered GI function.  Nutrition Focused Physical Exam:  Subcutaneous Fat:  Orbital Region: N/A Upper Arm Region: Severe depletion Thoracic and Lumbar Region: Severe depletion  Muscle:  Temple Region: Severe depletion Clavicle Bone Region: Severe  depletion Clavicle and Acromion Bone Region: Severe depletion Scapular Bone Region:N/A Dorsal Hand: Severe depletion Patellar Region: Severe depletion Anterior Thigh Region: Severe depletion Posterior Calf Region: Severe depletion  Edema: none  Labs: Low GFR High chloride  Height: Ht Readings from Last 1 Encounters:  09/09/13 6' (1.829 m)    Weight: Wt Readings from Last 1 Encounters:  09/09/13 95 lb 3.8 oz (43.2 kg)  Admit weight(3/5) 90 lb  Ideal Body Weight: 160 lb  % Ideal Body Weight: 59%  Wt Readings from Last 10 Encounters:  09/09/13 95 lb 3.8 oz (43.2 kg)  09/09/13 95 lb 3.8 oz (43.2 kg)  05/01/13 90 lb 3.2 oz (40.914 kg)  03/19/13 98 lb (44.453 kg)  03/16/13 100 lb (45.36 kg)  03/12/13 95 lb (43.092 kg)  02/28/13 100 lb (45.36 kg)  02/08/13 104 lb (47.174 kg)  01/27/13 104 lb (47.174 kg)  11/07/12 98 lb 0.6 oz (44.471 kg)    Usual Body Weight: 120 lb (pt reports years ago)  % Usual Body Weight: 79%  BMI:  Body mass index is 12.91 kg/(m^2). Underweight  Estimated Nutritional Needs: Kcal: 1400-1600 Protein: 60-70 grams Fluid: 1.4 L- 1.6 L/day  Skin: Incision on left hip  Diet Order: Dysphagia 2 with thin  EDUCATION NEEDS: -No education needs identified at this time   Intake/Output Summary (Last 24 hours) at 09/12/13 1107 Last data filed at 09/12/13 0800  Gross per 24 hour  Intake    240 ml  Output    700 ml  Net   -460 ml    Last BM: 3/5   Labs:   Recent Labs Lab 09/10/13 0254 09/11/13  0536 09/12/13 0523  NA 144 144 146  K 3.5* 3.5* 4.6  CL 110 112 114*  CO2 23 23 22   BUN 12 15 16   CREATININE 0.53 0.54 0.52  CALCIUM 10.1 9.9 10.3  GLUCOSE 126* 96 88    CBG (last 3)  No results found for this basename: GLUCAP,  in the last 72 hours  Scheduled Meds: . albuterol  2.5 mg Nebulization Q6H  . amLODipine  5 mg Oral Daily  . enalapril  10 mg Oral BID  . enoxaparin (LOVENOX) injection  30 mg Subcutaneous Q24H  . fentaNYL   12.5 mcg Transdermal Q72H  . pantoprazole  40 mg Oral Daily  . senna-docusate  1 tablet Oral BID  . sertraline  25 mg Oral QHS    Continuous Infusions:   Past Medical History  Diagnosis Date  . Back pain, chronic   . Headache(784.0)   . Hypertension   . Arthritis   . Meningioma 04/15/2012  . IDA (iron deficiency anemia)   . Diastolic dysfunction Oct 4010    EF 50-55%   . Hypercalcemia   . Bladder prolapse, female, acquired   . LVH (left ventricular hypertrophy) 03/22/2013    Ejection fraction 50-55%.  . Chronic diastolic heart failure     Past Surgical History  Procedure Laterality Date  . Abdominal hysterectomy    . Arm surgery    . Esophagogastroduodenoscopy  04/18/2012    UVO:ZDGUY hiatal hernia/NO OBVIOUS SOURCE FOR PROFOUND ANEMIA IDENTIFIED.MODERATE GASTRITIS CONTRIBUTES TO ANEMIA/no definite stricture identified, empiric dilation performed. + H.PYLORI  . Orif hip fracture Left 09/09/2013    Procedure: OPEN TREATMENT INTERNAL FIXATION LEFT HIP FRACTURE;  Surgeon: Sanjuana Kava, MD;  Location: [facility];  Service: Orthopedics;  Laterality: Left;    Kallie Locks Dietetic Intern Pager: [phone]

## 2013-09-12 NOTE — Procedures (Signed)
Objective Swallowing Evaluation: Modified Barium Swallowing Study   Patient Details  Name: Madison Hickman MRN: [id number] Date of Birth: May 12, 1924  Today's Date: 09/12/2013 Time: 1500-1530 SLP Time Calculation (min): 30 min  Past Medical History:  Past Medical History  Diagnosis Date  . Back pain, chronic   . Headache(784.0)   . Hypertension   . Arthritis   . Meningioma 04/15/2012  . IDA (iron deficiency anemia)   . Diastolic dysfunction Oct 0272    EF 50-55%   . Hypercalcemia   . Bladder prolapse, female, acquired   . LVH (left ventricular hypertrophy) 03/22/2013    Ejection fraction 50-55%.  . Chronic diastolic heart failure    Past Surgical History:  Past Surgical History  Procedure Laterality Date  . Abdominal hysterectomy    . Arm surgery    . Esophagogastroduodenoscopy  04/18/2012    ZDG:UYQIH hiatal hernia/NO OBVIOUS SOURCE FOR PROFOUND ANEMIA IDENTIFIED.MODERATE GASTRITIS CONTRIBUTES TO ANEMIA/no definite stricture identified, empiric dilation performed. + H.PYLORI  . Orif hip fracture Left 09/09/2013    Procedure: OPEN TREATMENT INTERNAL FIXATION LEFT HIP FRACTURE;  Surgeon: Sanjuana Kava, MD;  Location: [facility];  Service: Orthopedics;  Laterality: Left;   HPI:  [name] sustained acute left hip fracture, intertrochanteric, secondary to a fall at the skilled nursing facility. Status post open treatment internal reduction of fracture on 09/09/13 by Dr. Luna Glasgow. Pain managed with oral oxycodone and fentanyl patch. Has been up with PT. Will discharge to facility with follow up Dr Luna Glasgow 1 months. Staples to be removed 09/20/13. Toe touch weight bearing with walker. Swallow evaluation requested due to pain complaining of choking on saliva.     Assessment / Plan / Recommendation Clinical Impression  Dysphagia Diagnosis: Mild pharyngeal phase dysphagia;Suspected primary esophageal dysphagia Clinical impression: [name] presents with mild pharyngeal phase dysphagia and  suspected primary esophageal phase dysphagia characterized by premature spillage with delay in swallow initiation with thin liquids resulting in penetration and trace aspiration of thins during and after the swallow to which pt was not sensate (no protective cough response elicited). When pt was cued to cough, she was able to clear the aspirate. Small, controlled sips prevented aspiration, however some penetration still occurred. Pt noted to have prominent cricopharyngeus and air-filled esophagus. Barium tablet became lodged just prior to LES concerning for stricture. When additional food and liquid were given behind it, the esophagus filled the distal half and backflow into cervical esophagus was noted. No backflow into larynx was observed, however pt certainly at risk. Increased pharyngeal residue noted once esophagus was filled- ie. swallow function decreased as study went on. Once pt was moved back to stretcher (still upright for reflux precautions), she belched and reported some relief. Recommend D1/puree and nectar-thick liquids. Upright for all po. Consider GI consult for management of possible stricture. Purees will likely be tolerated better than mech soft in regards to current esophageal issues. Pt will need follow up SLP services in next venue.    Treatment Recommendation  Defer treatment plan to SLP at (Comment) ([facility])    Diet Recommendation Dysphagia 1 (Puree);Nectar-thick liquid   Liquid Administration via: Cup;Straw Medication Administration: Crushed with puree Supervision: Intermittent supervision to cue for compensatory strategies Compensations: Small sips/bites;Slow rate Postural Changes and/or Swallow Maneuvers: Seated upright 90 degrees;Upright 30-60 min after meal    Other  Recommendations Recommended Consults: Consider GI evaluation Oral Care Recommendations: Oral care BID Other Recommendations: Clarify dietary restrictions   Follow Up Recommendations  Skilled Nursing  facility    Frequency and Duration              General Date of Onset: 09/12/13 HPI: [name] sustained acute left hip fracture, intertrochanteric, secondary to a fall at the skilled nursing facility. Status post open treatment internal reduction of fracture on 09/09/13 by Dr. Luna Glasgow. Pain managed with oral oxycodone and fentanyl patch. Has been up with PT. Will discharge to facility with follow up Dr Luna Glasgow 1 months. Staples to be removed 09/20/13. Toe touch weight bearing with walker. Swallow evaluation requested due to pain complaining of choking on saliva. Type of Study: Modified Barium Swallowing Study Reason for Referral: Objectively evaluate swallowing function Previous Swallow Assessment: BSE earlier today Diet Prior to this Study: Dysphagia 3 (soft);Thin liquids Temperature Spikes Noted: No Respiratory Status: Room air History of Recent Intubation: No Behavior/Cognition: Alert;Cooperative;Pleasant mood Oral Cavity - Dentition: Edentulous Oral Motor / Sensory Function: Within functional limits Self-Feeding Abilities: Able to feed self with adaptive devices Patient Positioning: Upright in chair Baseline Vocal Quality: Clear Volitional Cough: Strong Volitional Swallow: Able to elicit Anatomy: Within functional limits Pharyngeal Secretions: Not observed secondary MBS    Reason for Referral Objectively evaluate swallowing function   Oral Phase Oral Preparation/Oral Phase Oral Phase: Impaired Oral - Solids Oral - Mechanical Soft: Piecemeal swallowing;Delayed oral transit (Pt edentulous)   Pharyngeal Phase Pharyngeal Phase Pharyngeal Phase: Impaired Pharyngeal - Nectar Pharyngeal - Nectar Straw: Delayed swallow initiation;Premature spillage to valleculae Pharyngeal - Thin Pharyngeal - Thin Teaspoon: Delayed swallow initiation;Premature spillage to valleculae Pharyngeal - Thin Cup: Delayed swallow initiation;Premature spillage to valleculae;Premature spillage to  pyriform sinuses;Penetration/Aspiration during swallow;Trace aspiration Penetration/Aspiration details (thin cup): Material does not enter airway;Material enters airway, passes BELOW cords without attempt by patient to eject out (silent aspiration) Pharyngeal - Thin Straw: Delayed swallow initiation;Premature spillage to valleculae;Premature spillage to pyriform sinuses;Penetration/Aspiration during swallow;Trace aspiration;Penetration/Aspiration after swallow Penetration/Aspiration details (thin straw): Material does not enter airway;Material enters airway, remains ABOVE vocal cords then ejected out;Material enters airway, passes BELOW cords without attempt by patient to eject out (silent aspiration) Pharyngeal - Solids Pharyngeal - Puree: Within functional limits Pharyngeal - Mechanical Soft: Within functional limits Pharyngeal - Pill: Within functional limits  Cervical Esophageal Phase    GO    Cervical Esophageal Phase Cervical Esophageal Phase: Impaired Cervical Esophageal Phase - Solids Mechanical Soft: Prominent cricopharyngeal segment;Esophageal backflow into cervical esophagus Cervical Esophageal Phase - Comment Cervical Esophageal Comment: Air filled esophagus, tertiary contractions, barium tablet did not pass through LES        Thank you,  Genene Churn, [place]  Earnestine Shipp 09/12/2013, 6:41 PM

## 2013-09-12 NOTE — Discharge Instructions (Signed)
Remove staples 09/20/13 Toe touch with walker weight bearing.  Follow up with Madison Hickman in 1 month Take lovenox for 1 month

## 2013-09-12 NOTE — Progress Notes (Signed)
I agree with student's assessment.   Alfie Rideaux A. Jimmye Norman, RD, LDN Pager: [phone]

## 2013-09-12 NOTE — Evaluation (Signed)
Clinical/Bedside Swallow Evaluation Patient Details  Name: Madison Hickman MRN: [id number] Date of Birth: 1924-03-16  Today's Date: 09/12/2013 Time: 1200-1225 SLP Time Calculation (min): 25 min  Past Medical History:  Past Medical History  Diagnosis Date  . Back pain, chronic   . Headache(784.0)   . Hypertension   . Arthritis   . Meningioma 04/15/2012  . IDA (iron deficiency anemia)   . Diastolic dysfunction Oct 0932    EF 50-55%   . Hypercalcemia   . Bladder prolapse, female, acquired   . LVH (left ventricular hypertrophy) 03/22/2013    Ejection fraction 50-55%.  . Chronic diastolic heart failure    Past Surgical History:  Past Surgical History  Procedure Laterality Date  . Abdominal hysterectomy    . Arm surgery    . Esophagogastroduodenoscopy  04/18/2012    TFT:DDUKG hiatal hernia/NO OBVIOUS SOURCE FOR PROFOUND ANEMIA IDENTIFIED.MODERATE GASTRITIS CONTRIBUTES TO ANEMIA/no definite stricture identified, empiric dilation performed. + H.PYLORI  . Orif hip fracture Left 09/09/2013    Procedure: OPEN TREATMENT INTERNAL FIXATION LEFT HIP FRACTURE;  Surgeon: Sanjuana Kava, MD;  Location: [facility];  Service: Orthopedics;  Laterality: Left;   HPI:  Ms. Madison Hickman sustained acute left hip fracture, intertrochanteric, secondary to a fall at the skilled nursing facility. Status post open treatment internal reduction of fracture on 09/09/13 by Dr. Luna Glasgow. Pain managed with oral oxycodone and fentanyl patch. Has been up with PT. Will discharge to facility with follow up Dr Luna Glasgow 1 months. Staples to be removed 09/20/13. Toe touch weight bearing with walker. Swallow evaluation requested due to pain complaining of choking on saliva.   Assessment / Plan / Recommendation Clinical Impression  Ms. Madison Hickman denies difficulty swallowing however she says it is painful for her to eat and points to her abdomen and chest as area of discomfort. Swallow initiation appears timely and pt shows no overt signs or  symptoms of aspiration, however intake was limited due to pt complaint of pain in chest. She says that this has been going on for "quite a long time" and usually takes TUMS to alleviate symptoms. Recommend MBSS due to inconsistent reports of dysphagia and pt with chest pain with swallow. Suspect esophageal component so pt may need GI consult. Full results of MBSS to follow this afternoon. Above to Dyanne Carrel, NP.    Aspiration Risk  Mild    Diet Recommendation Dysphagia 3 (Mechanical Soft);Thin liquid   Liquid Administration via: Cup Medication Administration: Crushed with puree Supervision: Intermittent supervision to cue for compensatory strategies Compensations: Small sips/bites;Slow rate Postural Changes and/or Swallow Maneuvers: Seated upright 90 degrees;Upright 30-60 min after meal    Other  Recommendations Recommended Consults: MBS Oral Care Recommendations: Oral care BID Other Recommendations: Clarify dietary restrictions   Follow Up Recommendations       Frequency and Duration              Swallow Study Prior Functional Status       General Date of Onset: 09/12/13 HPI: Ms. Madison Hickman sustained acute left hip fracture, intertrochanteric, secondary to a fall at the skilled nursing facility. Status post open treatment internal reduction of fracture on 09/09/13 by Dr. Luna Glasgow. Pain managed with oral oxycodone and fentanyl patch. Has been up with PT. Will discharge to facility with follow up Dr Luna Glasgow 1 months. Staples to be removed 09/20/13. Toe touch weight bearing with walker. Swallow evaluation requested due to pain complaining of choking on saliva. Type of Study: Bedside swallow evaluation Previous  Swallow Assessment: none on record Diet Prior to this Study: Dysphagia 3 (soft);Thin liquids Temperature Spikes Noted: No Respiratory Status: Room air History of Recent Intubation: No Behavior/Cognition: Alert;Cooperative;Pleasant mood Oral Cavity - Dentition:  Edentulous Self-Feeding Abilities: Needs assist Patient Positioning: Upright in chair Baseline Vocal Quality: Clear Volitional Cough: Strong Volitional Swallow: Able to elicit    Oral/Motor/Sensory Function Overall Oral Motor/Sensory Function: Appears within functional limits for tasks assessed (mild decreased coordination)   Ice Chips Ice chips: Within functional limits Presentation: Spoon   Thin Liquid Thin Liquid: Impaired Presentation: Cup;Straw Pharyngeal  Phase Impairments:  (Pt complains of acute pain in chest with each swallow)    Nectar Thick Nectar Thick Liquid: Not tested   Honey Thick Honey Thick Liquid: Not tested   Puree Puree: Within functional limits Presentation: Spoon   Solid   GO   Thank you,  Genene Churn, CCC-SLP [phone]   Solid: Impaired Presentation: Spoon Pharyngeal Phase Impairments:  (swallow appears effortful and pt complains of discomfort)       PORTER,DABNEY 09/12/2013,1:00 PM

## 2013-09-13 ENCOUNTER — Encounter (HOSPITAL_COMMUNITY): Payer: Self-pay | Admitting: *Deleted

## 2013-09-13 ENCOUNTER — Encounter (HOSPITAL_COMMUNITY)
Admission: EM | Disposition: A | Discharge status: Skilled Nursing Facility | Payer: Self-pay | Source: Home / Self Care | Attending: Internal Medicine

## 2013-09-13 DIAGNOSIS — R1310 Dysphagia, unspecified: Secondary | ICD-10-CM

## 2013-09-13 HISTORY — PX: ESOPHAGOGASTRODUODENOSCOPY (EGD) WITH ESOPHAGEAL DILATION: SHX5812

## 2013-09-13 LAB — BASIC METABOLIC PANEL
BUN: 16 mg/dL (ref 6–23)
CHLORIDE: 110 meq/L (ref 96–112)
CO2: 26 mEq/L (ref 19–32)
Calcium: 10.2 mg/dL (ref 8.4–10.5)
Creatinine, Ser: 0.55 mg/dL (ref 0.50–1.10)
GFR calc non Af Amer: 82 mL/min — ABNORMAL LOW (ref >90)
Glucose, Bld: 91 mg/dL (ref 70–99)
POTASSIUM: 3.9 meq/L (ref 3.7–5.3)
Sodium: 143 mEq/L (ref 137–147)

## 2013-09-13 SURGERY — ESOPHAGOGASTRODUODENOSCOPY (EGD) WITH ESOPHAGEAL DILATION
Anesthesia: Moderate Sedation

## 2013-09-13 MED ORDER — LIDOCAINE VISCOUS 2 % MT SOLN
OROMUCOSAL | Status: AC
  Filled 2013-09-13: qty 15

## 2013-09-13 MED ORDER — MEPERIDINE HCL 100 MG/ML IJ SOLN
INTRAMUSCULAR | Status: AC
  Filled 2013-09-13: qty 2

## 2013-09-13 MED ORDER — FLEET ENEMA 7-19 GM/118ML RE ENEM: 1.0000 | ENEMA | Freq: Every day | RECTAL | Status: DC | PRN

## 2013-09-13 MED ORDER — MAGNESIUM CITRATE PO SOLN
1.0000 | Freq: Once | ORAL | Status: AC
  Administered 2013-09-13: 1 via ORAL
  Filled 2013-09-13: qty 296

## 2013-09-13 MED ORDER — ENOXAPARIN SODIUM 30 MG/0.3ML ~~LOC~~ SOLN
30.0000 mg | SUBCUTANEOUS | Status: DC
  Filled 2013-09-13: qty 0.3

## 2013-09-13 MED ORDER — MEPERIDINE HCL 100 MG/ML IJ SOLN
INTRAMUSCULAR | Status: DC | PRN
  Administered 2013-09-13: 25 mg via INTRAVENOUS

## 2013-09-13 MED ORDER — MIDAZOLAM HCL 5 MG/5ML IJ SOLN
INTRAMUSCULAR | Status: DC | PRN
  Administered 2013-09-13 (×2): 1 mg via INTRAVENOUS

## 2013-09-13 MED ORDER — ONDANSETRON HCL 4 MG/2ML IJ SOLN
INTRAMUSCULAR | Status: DC | PRN
  Administered 2013-09-13: 4 mg via INTRAVENOUS

## 2013-09-13 MED ORDER — ONDANSETRON HCL 4 MG/2ML IJ SOLN
INTRAMUSCULAR | Status: AC
  Filled 2013-09-13: qty 2

## 2013-09-13 MED ORDER — SODIUM CHLORIDE 0.9 % IV SOLN: INTRAVENOUS | Status: DC

## 2013-09-13 MED ORDER — SODIUM CHLORIDE 0.9 % IV SOLN
INTRAVENOUS | Status: DC
Start: 2013-09-13 — End: 2013-09-13

## 2013-09-13 MED ORDER — STERILE WATER FOR IRRIGATION IR SOLN
Status: DC | PRN
  Administered 2013-09-13: 14:00:00

## 2013-09-13 MED ORDER — MIDAZOLAM HCL 5 MG/5ML IJ SOLN
INTRAMUSCULAR | Status: AC
  Filled 2013-09-13: qty 10

## 2013-09-13 MED ORDER — SUCRALFATE 1 GM/10ML PO SUSP
1.0000 g | Freq: Three times a day (TID) | ORAL | Status: DC
  Administered 2013-09-13: 1 g via ORAL
  Filled 2013-09-13 (×3): qty 10

## 2013-09-13 NOTE — Consult Note (Signed)
Referring Provider: Louellen Molder, MD Primary Care Physician:  Jani Gravel, MD Primary Gastroenterologist:  Barney Drain, MD  Reason for Consultation:  Dysphagia, ?esophageal stricture  HPI: Madison Hickman is a 80 y.o. female admitted due to left hip fracture. She had 09/09/13. Due to complaints of choking, she had speech therapy evaluation including MBSS. Barium tablet became lodged above the LES concerning for stricture. When additional food and liquid was given behind it, the esophagus filled the distal half and backflow into the cervical esophagus was noted. She is felt to be at risk for backflow into the larynx.  Patient states she has had problems swallowing for some time. She denies odynophagia. Complains of postprandial epigastric pain and discomfort to point of having to stop eating. C/o early satiety. C/o nausea without vomiting. Chronically constipated. Denies melena, brbpr. C/o heartburn.   Last EGD 04/2012 by Dr. Oneida Alar (done for microcytic anemia/dysphagia) showed moderate h.pylori gastritis and no definite stricture s/p esophageal dilation. Questionable previous colonoscopy, remotely.   Prior to Admission medications   Medication Sig Start Date End Date Taking? Authorizing Provider  amLODipine (NORVASC) 5 MG tablet Take 1 tablet (5 mg total) by mouth daily. For blood pressure 01/27/13  Yes Alycia Rossetti, MD  aspirin EC 81 MG tablet Take 81 mg by mouth daily.   Yes Historical Provider, MD  calcium carbonate (TUMS - DOSED IN MG ELEMENTAL CALCIUM) 500 MG chewable tablet Chew 2 tablets by mouth every 8 (eight) hours as needed for indigestion or heartburn.   Yes Historical Provider, MD  docusate sodium (COLACE) 100 MG capsule Take 100 mg by mouth daily as needed for constipation. 08/17/12  Yes Delfina Redwood, MD  enalapril (VASOTEC) 10 MG tablet Take 1 tablet (10 mg total) by mouth 2 (two) times daily. 01/19/13  Yes Alycia Rossetti, MD  lidocaine (LIDODERM) 5 % Place 1 patch onto the  skin daily. Remove & Discard patch within 12 hours or as directed by MD   Yes Historical Provider, MD  LORazepam (ATIVAN) 0.5 MG tablet Take 0.25 mg by mouth every 6 (six) hours as needed for anxiety.   Yes Historical Provider, MD  nitroGLYCERIN (NITROSTAT) 0.4 MG SL tablet Place 1 tablet (0.4 mg total) under the tongue every 5 (five) minutes as needed for chest pain. 03/22/13  Yes [name], MD  pantoprazole (PROTONIX) 40 MG tablet Take 1 tablet (40 mg total) by mouth daily. 30 minutes before breakfast for GERD 11/17/12 11/17/13 Yes Alycia Rossetti, MD  sennosides-docusate sodium (SENOKOT-S) 8.6-50 MG tablet Take 1 tablet by mouth 2 (two) times daily.   Yes Historical Provider, MD  sertraline (ZOLOFT) 25 MG tablet Take 25 mg by mouth at bedtime.   Yes Historical Provider, MD  Skin Protectants, Misc. (EUCERIN) cream Apply 1 application topically as needed for dry skin.   Yes Historical Provider, MD  enoxaparin (LOVENOX) 30 MG/0.3ML injection Inject 0.3 mLs (30 mg total) into the skin daily. 09/12/13 10/13/13  Radene Gunning, NP  feeding supplement, ENSURE COMPLETE, (ENSURE COMPLETE) LIQD Take 237 mLs by mouth 2 (two) times daily between meals. 09/12/13   Radene Gunning, NP  fentaNYL (DURAGESIC - DOSED MCG/HR) 12 MCG/HR Place 1 patch (12.5 mcg total) onto the skin every 3 (three) days. 09/12/13   Radene Gunning, NP  magnesium hydroxide (MILK OF MAGNESIA) 400 MG/5ML suspension Take 30 mLs by mouth daily as needed for mild constipation. 09/12/13   Radene Gunning, NP  oxyCODONE-acetaminophen (PERCOCET/ROXICET) [phone]  MG per tablet Take 1 tablet by mouth every 4 (four) hours as needed for moderate pain or severe pain. 09/12/13   Radene Gunning, NP    Current Facility-Administered Medications  Medication Dose Route Frequency Provider Last Rate Last Dose  . amLODipine (NORVASC) tablet 5 mg  5 mg Oral Daily Nimish C Gosrani, MD   5 mg at 09/13/13 1155  . calcium carbonate (TUMS - dosed in mg elemental calcium)  chewable tablet 400 mg of elemental calcium  2 tablet Oral Q8H PRN Nimish C Gosrani, MD   400 mg of elemental calcium at 09/11/13 1719  . enalapril (VASOTEC) tablet 10 mg  10 mg Oral BID Doree Albee, MD   10 mg at 09/13/13 1154  . feeding supplement (ENSURE COMPLETE) (ENSURE COMPLETE) liquid 237 mL  237 mL Oral BID BM Jenifer A Williams, RD   237 mL at 09/13/13 1155  . fentaNYL (DURAGESIC - dosed mcg/hr) 12.5 mcg  12.5 mcg Transdermal Q72H [name], MD   12.5 mcg at 09/12/13 1331  . hydrALAZINE (APRESOLINE) injection 5 mg  5 mg Intravenous Q6H PRN Doree Albee, MD   5 mg at 09/09/13 0602  . hydrocerin (EUCERIN) cream 1 application  1 application Topical PRN Nimish C Gosrani, MD      . LORazepam (ATIVAN) tablet 0.25 mg  0.25 mg Oral Q6H PRN Doree Albee, MD   0.25 mg at 09/10/13 0605  . morphine 2 MG/ML injection 1 mg  1 mg Intravenous Q2H PRN [name], MD      . nitroGLYCERIN (NITROSTAT) SL tablet 0.4 mg  0.4 mg Sublingual Q5 min PRN Nimish Luther Parody, MD      . oxyCODONE-acetaminophen (PERCOCET/ROXICET) 5-325 MG per tablet 1 tablet  1 tablet Oral Q4H PRN Doree Albee, MD   1 tablet at 09/13/13 1155  . pantoprazole (PROTONIX) EC tablet 40 mg  40 mg Oral Daily Nimish C Anastasio Champion, MD   40 mg at 09/13/13 1155  . senna-docusate (Senokot-S) tablet 1 tablet  1 tablet Oral BID Doree Albee, MD   1 tablet at 09/13/13 1154  . sertraline (ZOLOFT) tablet 25 mg  25 mg Oral QHS Doree Albee, MD   25 mg at 09/12/13 2228  . sodium phosphate (FLEET) 7-19 GM/118ML enema 1 enema  1 enema Rectal Daily PRN Radene Gunning, NP        Allergies as of 09/07/2013  . (No Known Allergies)    Past Medical History  Diagnosis Date  . Back pain, chronic   . Headache(784.0)   . Hypertension   . Arthritis   . Meningioma 04/15/2012  . IDA (iron deficiency anemia)   . Diastolic dysfunction Oct 0000000    EF 50-55%   . Hypercalcemia   . Bladder prolapse, female, acquired   . LVH (left  ventricular hypertrophy) 03/22/2013    Ejection fraction 50-55%.  . Chronic diastolic heart failure     Past Surgical History  Procedure Laterality Date  . Abdominal hysterectomy    . Arm surgery    . Esophagogastroduodenoscopy  04/18/2012    [id number] hiatal hernia/NO OBVIOUS SOURCE FOR PROFOUND ANEMIA IDENTIFIED.MODERATE GASTRITIS CONTRIBUTES TO ANEMIA/no definite stricture identified, empiric dilation performed. + H.PYLORI  . Orif hip fracture Left 09/09/2013    Procedure: OPEN TREATMENT INTERNAL FIXATION LEFT HIP FRACTURE;  Surgeon: Sanjuana Kava, MD;  Location: [facility];  Service: Orthopedics;  Laterality: Left;    History reviewed. No pertinent family history.  History   Social History  . Marital Status: Widowed    Spouse Name: N/A    Number of Children: 3  . Years of Education: N/A   Occupational History  . retired Regulatory affairs officer    Social History Main Topics  . Smoking status: Never Smoker   . Smokeless tobacco: Not on file  . Alcohol Use: No  . Drug Use: No  . Sexual Activity: No   Other Topics Concern  . Not on file   Social History Narrative   Lives w/ mentally-challenged son   2 other living children     ROS:  General: Negative for anorexia, weight loss, fever, chills, fatigue, weakness. Eyes: Negative for vision changes.  ENT: Negative for hoarseness,  nasal congestion. See HPI. CV: Negative for chest pain, angina, palpitations, dyspnea on exertion, peripheral edema.  Respiratory: Negative for dyspnea at rest, dyspnea on exertion, cough, sputum, wheezing.  GI: See history of present illness. GU:  Negative for dysuria, hematuria, urinary incontinence, urinary frequency, nocturnal urination.  MS: left hip pain s/p fracture/surgery  Derm: Negative for rash or itching.  Neuro: Negative for weakness, abnormal sensation, seizure, frequent headaches, memory loss, confusion.  Psych: Negative for anxiety, depression, suicidal ideation, hallucinations.  Endo:  Negative for unusual weight change.  Heme: Negative for bruising or bleeding. Allergy: Negative for rash or hives.       Physical Examination: Vital signs in last 24 hours: Temp:  [98.1 F (36.7 C)-98.5 F (36.9 C)] 98.1 F (36.7 C) (03/11 0626) Pulse Rate:  [71-77] 71 (03/11 0626) Resp:  [18] 18 (03/11 0626) BP: (122-168)/(80-94) 168/86 mmHg (03/11 0626) SpO2:  [95 %-96 %] 96 % (03/11 0626) Weight:  [103 lb 9.9 oz (47 kg)] 103 lb 9.9 oz (47 kg) (03/11 0626) Last BM Date: 09/07/13  General: Elderly, Well-nourished, well-developed in no acute distress.  Head: Normocephalic, atraumatic.   Eyes: Conjunctiva pink, no icterus. Mouth: Oropharyngeal mucosa moist and pink , no lesions erythema or exudate. Neck: Supple without thyromegaly, masses, or lymphadenopathy.  Lungs: Clear to auscultation bilaterally.  Heart: Regular rate and rhythm, no murmurs rubs or gallops.  Abdomen: Bowel sounds are normal, nontender, nondistended, no hepatosplenomegaly or masses, no abdominal bruits or    hernia , no rebound or guarding.   Rectal: not performed Extremities: No lower extremity edema, clubbing, deformity.  Neuro: Alert and oriented x 4 , grossly normal neurologically.  Skin: Warm and dry, no rash or jaundice.   Psych: Alert and cooperative, normal mood and affect.        Intake/Output from previous day: 03/10 0701 - 03/11 0700 In: 360 [P.O.:360] Out: 500 [Urine:500] Intake/Output this shift: Total I/O In: 120 [P.O.:120] Out: 250 [Urine:250]  Lab Results: CBC  Recent Labs  09/11/13 0536 09/12/13 0523  WBC 8.7 7.2  HGB 10.6* 10.5*  HCT 31.3* 31.3*  MCV 85.3 86.5  PLT 200 213   BMET  Recent Labs  09/11/13 0536 09/12/13 0523 09/13/13 0526  NA 144 146 143  K 3.5* 4.6 3.9  CL 112 114* 110  CO2 23 22 26   GLUCOSE 96 88 91  BUN 15 16 16   CREATININE 0.54 0.52 0.55  CALCIUM 9.9 10.3 10.2   LFT No results found for this basename: BILITOT, BILIDIR, IBILI, ALKPHOS, AST,  ALT, PROT, ALBUMIN,  in the last 72 hours   PT/INR )No results found for this basename: LABPROT, INR,  in the last 72 hours    Lab Results  Component Value Date  [id number] 09/08/2013    Imaging Studies: Dg Chest 1 View  09/07/2013   CLINICAL DATA:  Fall.  Hip pain  EXAM: CHEST - 1 VIEW  COMPARISON:  03/19/2013  FINDINGS: Cardiac enlargement.  Thoracic aortic aneurysm with tortuosity.  Negative for heart failure. Negative for infiltrate effusion or mass.  IMPRESSION: Cardiac enlargement.  No acute abnormality.   Electronically Signed   By: Franchot Gallo M.D.   On: 09/07/2013 15:10   Dg Hip Complete Left  09/09/2013   CLINICAL DATA:  Status post ORIF of left femur  EXAM: LEFT HIP - COMPLETE 2+ VIEW  COMPARISON:  09/07/2013  FINDINGS: Three images from C-arm radiography obtained in the operating room show changes from ORIF of intertrochanteric fracture of proximal left femur. There is a dynamic hip screw and sideplate device which reduces the fracture.  IMPRESSION: 1. Status post ORIF of left proximal femur fracture.   Electronically Signed   By: Kerby Moors M.D.   On: 09/09/2013 11:43     Dg Swallowing Func-speech Pathology  09/12/2013   Ephraim Hamburger, CCC-SLP     09/12/2013  6:41 PM Objective Swallowing Evaluation: Modified Barium Swallowing Study    Patient Details  Name: Madison Hickman MRN: [id number] Date of Birth: December 11, 1923  Today's Date: 09/12/2013 Time: 1500-1530 SLP Time Calculation (min): 30 min  Past Medical History:  Past Medical History  Diagnosis Date  . Back pain, chronic   . Headache(784.0)   . Hypertension   . Arthritis   . Meningioma 04/15/2012  . IDA (iron deficiency anemia)   . Diastolic dysfunction Oct 0000000    EF 50-55%   . Hypercalcemia   . Bladder prolapse, female, acquired   . LVH (left ventricular hypertrophy) 03/22/2013    Ejection fraction 50-55%.  . Chronic diastolic heart failure    Past Surgical History:  Past Surgical History  Procedure Laterality Date  . Abdominal  hysterectomy    . Arm surgery    . Esophagogastroduodenoscopy  04/18/2012    [id number] hiatal hernia/NO OBVIOUS SOURCE FOR PROFOUND ANEMIA  IDENTIFIED.MODERATE GASTRITIS CONTRIBUTES TO ANEMIA/no definite  stricture identified, empiric dilation performed. + H.PYLORI  . Orif hip fracture Left 09/09/2013    Procedure: OPEN TREATMENT INTERNAL FIXATION LEFT HIP FRACTURE;   Surgeon: Sanjuana Kava, MD;  Location: [facility];  Service:  Orthopedics;  Laterality: Left;   HPI:  Madison Hickman sustained acute left hip fracture,  intertrochanteric, secondary to a fall at the skilled nursing  facility. Status post open treatment internal reduction of  fracture on 09/09/13 by Dr. Luna Glasgow. Pain managed with oral  oxycodone and fentanyl patch. Has been up with PT. Will discharge  to facility with follow up Dr Luna Glasgow 1 months. Staples to be  removed 09/20/13. Toe touch weight bearing with walker. Swallow  evaluation requested due to pain complaining of choking on  saliva.     Assessment / Plan / Recommendation Clinical Impression  Dysphagia Diagnosis: Mild pharyngeal phase dysphagia;Suspected  primary esophageal dysphagia Clinical impression: Madison Hickman presents with mild pharyngeal phase  dysphagia and suspected primary esophageal phase dysphagia  characterized by premature spillage with delay in swallow  initiation with thin liquids resulting in penetration and trace  aspiration of thins during and after the swallow to which pt was  not sensate (no protective cough response elicited). When pt was  cued to cough, she was able to clear the aspirate. Small,  controlled sips prevented aspiration, however some penetration  still occurred.  Pt noted to have prominent cricopharyngeus and  air-filled esophagus. Barium tablet became lodged just prior to  LES concerning for stricture. When additional food and liquid  were given behind it, the esophagus filled the distal half and  backflow into cervical esophagus was noted. No backflow into  larynx  was observed, however pt certainly at risk. Increased  pharyngeal residue noted once esophagus was filled- ie. swallow  function decreased as study went on. Once pt was moved back to  stretcher (still upright for reflux precautions), she belched and  reported some relief. Recommend D1/puree and nectar-thick  liquids. Upright for all po. Consider GI consult for management  of possible stricture. Purees will likely be tolerated better  than mech soft in regards to current esophageal issues. Pt will  need follow up SLP services in next venue.    Treatment Recommendation  Defer treatment plan to SLP at (Comment) ([facility])    Diet Recommendation Dysphagia 1 (Puree);Nectar-thick liquid   Liquid Administration via: Cup;Straw Medication Administration: Crushed with puree Supervision: Intermittent supervision to cue for compensatory  strategies Compensations: Small sips/bites;Slow rate Postural Changes and/or Swallow Maneuvers: Seated upright 90  degrees;Upright 30-60 min after meal    Other  Recommendations Recommended Consults: Consider GI  evaluation Oral Care Recommendations: Oral care BID Other Recommendations: Clarify dietary restrictions   Follow Up Recommendations  Skilled Nursing facility    Frequency and Duration              General Date of Onset: 09/12/13 HPI: Madison Hickman sustained acute left hip fracture,  intertrochanteric, secondary to a fall at the skilled nursing  facility. Status post open treatment internal reduction of  fracture on 09/09/13 by Dr. Luna Glasgow. Pain managed with oral  oxycodone and fentanyl patch. Has been up with PT. Will discharge  to facility with follow up Dr Luna Glasgow 1 months. Staples to be  removed 09/20/13. Toe touch weight bearing with walker. Swallow  evaluation requested due to pain complaining of choking on  saliva. Type of Study: Modified Barium Swallowing Study Reason for Referral: Objectively evaluate swallowing function Previous Swallow Assessment: BSE earlier today Diet Prior  to this Study: Dysphagia 3 (soft);Thin liquids Temperature Spikes Noted: No Respiratory Status: Room air History of Recent Intubation: No Behavior/Cognition: Alert;Cooperative;Pleasant mood Oral Cavity - Dentition: Edentulous Oral Motor / Sensory Function: Within functional limits Self-Feeding Abilities: Able to feed self with adaptive devices Patient Positioning: Upright in chair Baseline Vocal Quality: Clear Volitional Cough: Strong Volitional Swallow: Able to elicit Anatomy: Within functional limits Pharyngeal Secretions: Not observed secondary MBS    Reason for Referral Objectively evaluate swallowing function   Oral Phase Oral Preparation/Oral Phase Oral Phase: Impaired Oral - Solids Oral - Mechanical Soft: Piecemeal swallowing;Delayed oral transit  (Pt edentulous)   Pharyngeal Phase Pharyngeal Phase Pharyngeal Phase: Impaired Pharyngeal - Nectar Pharyngeal - Nectar Straw: Delayed swallow initiation;Premature  spillage to valleculae Pharyngeal - Thin Pharyngeal - Thin Teaspoon: Delayed swallow initiation;Premature  spillage to valleculae Pharyngeal - Thin Cup: Delayed swallow initiation;Premature  spillage to valleculae;Premature spillage to pyriform  sinuses;Penetration/Aspiration during swallow;Trace aspiration Penetration/Aspiration details (thin cup): Material does not  enter airway;Material enters airway, passes BELOW cords without  attempt by patient to eject out (silent aspiration) Pharyngeal - Thin Straw: Delayed swallow initiation;Premature  spillage to valleculae;Premature spillage to pyriform  sinuses;Penetration/Aspiration during swallow;Trace  aspiration;Penetration/Aspiration after swallow Penetration/Aspiration details (thin straw): Material does not  enter airway;Material enters airway, remains ABOVE vocal cords  then ejected out;Material enters airway,  passes BELOW cords  without attempt by patient to eject out (silent aspiration) Pharyngeal - Solids Pharyngeal - Puree: Within functional limits  Pharyngeal - Mechanical Soft: Within functional limits Pharyngeal - Pill: Within functional limits  Cervical Esophageal Phase    GO    Cervical Esophageal Phase Cervical Esophageal Phase: Impaired Cervical Esophageal Phase - Solids Mechanical Soft: Prominent cricopharyngeal segment;Esophageal  backflow into cervical esophagus Cervical Esophageal Phase - Comment Cervical Esophageal Comment: Air filled esophagus, tertiary  contractions, barium tablet did not pass through LES        Thank you,  Genene Churn, Bessie  [place] 09/12/2013, 6:41 PM     Impression: 80 y/o female admitted due to left hip fracture s/p surgery four days ago who we have been requested to see due to abnormal MBSS. Patient has chronic complaints of dysphagia. Last EGD in 2013 and no apparent stricture at that time. BPE was planned to evaluate for esophageal dysmotility but was never completed. She complains of early satiety, dysphagia with discomfort in the chest with meals, epigastric pain. She was noted to have barium tablet lodge at LES and upstream filling of esophagus with repeated swallows of food and liquid highly suggestive of stricture. Cannot rule out esophageal motility disorder at this point.   H/O H.pylori gastritis treated in 2013.   Plan: 1. Discussed with Dr. Gala Romney. Plan for EGD/ED today given window of opportunity, Lovenox last given yesterday morning. Based on findings, will hopefully restart Lovenox this evening.   We would like to thank you for the opportunity to participate in the care of Madison Hickman.   LOS: 6 days   Neil Crouch  09/13/2013, 11:56 AM  Attending note:  Patient seen and examined. Agree with need for EGD.  I have offered the patient an EGD with esophageal dilation as appropriate.  The risks, benefits, limitations, alternatives and imponderables have been reviewed with the patient. Potential for esophageal dilation, biopsy, etc. have also been reviewed.  Questions have been  answered. All parties agreeable.

## 2013-09-13 NOTE — Clinical Social Work Note (Signed)
CSW updated [facility] on pt. Cleared by ortho. Now awaiting GI consult. CSW will continue to follow.   Benay Pike, [place]

## 2013-09-13 NOTE — Op Note (Signed)
[facility] [address] [place], [address]   ENDOSCOPY PROCEDURE REPORT  PATIENT: Madison Hickman, Madison Hickman  MR#: [id number] BIRTHDATE: 1923/09/19 , 89  yrs. old GENDER: Female ENDOSCOPIST: R.  Garfield Cornea, MD FACP [facility] REFERRED BY:  Jani Gravel, M.D. PROCEDURE DATE:  09/13/2013 PROCEDURE:     EGD with Venia Minks dilation followed by esophageal biopsy INDICATIONS:     dysphagia; abnormal barium swallow  INFORMED CONSENT:   The risks, benefits, limitations, alternatives and imponderables have been discussed.  The potential for biopsy, esophogeal dilation, etc. have also been reviewed.  Questions have been answered.  All parties agreeable.  Please see the history and physical in the medical record for more information.  MEDICATIONS:   Versed 2 mg IV and Demerol 25 mg IV in divided doses. Xylocaine gel orally. Zofran 4 mg IV  DESCRIPTION OF PROCEDURE:   The [id number] ([id number])  endoscope was introduced through the mouth and advanced to the second portion of the duodenum without difficulty or limitations.  The mucosal surfaces were surveyed very carefully during advancement of the scope and upon withdrawal.  Retroflexion view of the proximal stomach and esophagogastric junction was performed.      FINDINGS: Very thin linear ribbon like plaques covering the esophagus diffusely.  Esophagus patent throughout its course. Stomach empty. Normal gastric mucosa. Patent pylorus. Examination of first and second portion of the duodenum revealed 2 juxta ampullary duodenal diverticula only.  THERAPEUTIC / DIAGNOSTIC MANEUVERS PERFORMED:  A 52 French Maloney dilator was passed to full insertion with moderate resistance. A look back revealed no apparent complication related to this maneuver. Subsequently, biopsies of the distal esophagus were taken for histologic study.   COMPLICATIONS:  None  IMPRESSION: Skinny esophageal plaques-query esophagitis dissecans-can be seen with  pill-induced injury and GERD. Status post passage of a Maloney dilator and subsequent biopsy. Duodenal diverticula  RECOMMENDATIONS:  Add Carafate suspension 4 times a day x10 days to chronic PPI regimen. Soft diet.  Swallowing precautions with medications.  Followup on pathology.    _______________________________ R. Garfield Cornea, MD FACP [facility] eSigned:  R. Garfield Cornea, MD FACP [facility] 09/13/2013 2:55 PM     CC:  PATIENT NAME:  Madison Hickman, Madison Hickman MR#: [id number]

## 2013-09-13 NOTE — Progress Notes (Signed)
Patient seen and examined this morning. Assessment and plan outlined above reviewed. EGD findings  reviewed. Added Carafate and continue PPI. Continue with dysphagia level I diet.

## 2013-09-13 NOTE — Progress Notes (Signed)
[facility] PROGRESS NOTE  Madison Hickman [id number] DOB: 02-23-1924 DOA: 09/07/2013 PCP: Jani Gravel, MD  Assessment/Plan: 1. Acute left hip fracture, intertrochanteric, secondary to a fall at the skilled nursing facility. Status post open treatment internal reduction of fracture on 09/09/13 by Dr. Luna Glasgow. Pain managed with po medicine. Continue Fentanyl patch. Ready for discharge tomorrow per ortho   Odynophagia/Dysphagia: Modified barium swallow results concerning for esophageal stricture. EGD on 09/13/13 with results of skinny esophageal plaques-query esophagitis dissecans-can be seen with pill-induced injury and GERD. Status post passage of a Maloney dilator and subsequent biopsy. Duodenal diverticula. Recommended carafate for 10 days and soft diet.   Hypertension.Fair control. Pain likely factor. We'll continue amlodipine and enalapril. Will monitor   Chronic low back pain, on chronic transdermal fentanyl.   Chronic diastolic heart failure, clinically compensated. Volume status +2L.   Acute blood loss anemia, superimposed on Known iron deficiency/microcytic anemia. S/P 1 unit PRBC's. Hg 10.5.   Mild confusion, possibly secondary to opiates, plus/minus mild dementia. Resolved. Appears to be at baseline. Morphine discontinued. Her TSH and vitamin B12 level were within normal limits. Reduce narcotic dose   Hypokalemia: mild. Will replete and recheck.   Code Status: DNR Family Communication: none present Disposition Plan: snf tomorrow   Consultants:  GI  orthopedics  Procedures: Barium swallow 09/12/13  Open treatment interrnal reduction of left hip intertrochanteric fracture 09/09/13 EGD 09/13/13  Antibiotics: Cefazolin IV 09/09/13 x1 HPI/Subjective: Lying in bed somewhat drowsy. Denies pain/discomfort.   Objective: Filed Vitals:   09/13/13 1519  BP: 123/61  Pulse: 65  Temp: 97.5 F (36.4 C)  Resp: 18    Intake/Output Summary (Last 24 hours) at 09/13/13  1533 Last data filed at 09/13/13 1450  Gross per 24 hour  Intake    965 ml  Output    550 ml  Net    415 ml   Filed Weights   09/09/13 0540 09/13/13 0626 09/13/13 1329  Weight: 43.2 kg (95 lb 3.8 oz) 47 kg (103 lb 9.9 oz) 46.72 kg (103 lb)    Exam:   General:  Thin frail NAD  Cardiovascular: RRR no gallup no rub. No LE edema  Respiratory: normal effort Somewhat shallow. BS clear bilaterally no wheeze  Abdomen: flat soft +BS non-tender to palpation no guarding  Musculoskeletal: dressing to left hip is dry and intact.    Data Reviewed: Basic Metabolic Panel:  Recent Labs Lab 09/09/13 0558 09/10/13 [id number] 09/11/13 0536 09/12/13 0523 09/13/13 0526  NA 143 144 144 146 143  K 3.7 3.5* 3.5* 4.6 3.9  CL 108 110 112 114* 110  CO2 23 23 23 22 26   GLUCOSE 102* 126* 96 88 91  BUN 8 12 15 16 16   CREATININE 0.54 0.53 0.54 0.52 0.55  CALCIUM 10.4 10.1 9.9 10.3 10.2   Liver Function Tests:  Recent Labs Lab 09/08/13 0525  AST 15  ALT 8  ALKPHOS 51  BILITOT 0.5  PROT 5.7*  ALBUMIN 3.0*   No results found for this basename: LIPASE, AMYLASE,  in the last 168 hours No results found for this basename: AMMONIA,  in the last 168 hours CBC:  Recent Labs Lab 09/07/13 1448  09/08/13 2042 09/09/13 0558 09/10/13 0637 09/11/13 0536 09/12/13 0523  WBC 11.9*  < > 8.0 9.7 9.5 8.7 7.2  NEUTROABS 10.2*  --  5.9  --  7.7 6.6 5.1  HGB 10.7*  < > 11.0* 11.4* 9.5* 10.6* 10.5*  HCT 33.0*  < >  32.9* 34.5* 28.9* 31.3* 31.3*  MCV 86.4  < > 86.4 86.0 86.0 85.3 86.5  PLT 284  < > 215 227 206 200 213  < > = values in this interval not displayed. Cardiac Enzymes: No results found for this basename: CKTOTAL, CKMB, CKMBINDEX, TROPONINI,  in the last 168 hours BNP (last 3 results)  Recent Labs  11/04/12 1245 11/10/12 1726  PROBNP 1050.0* 1130.0*   CBG: No results found for this basename: GLUCAP,  in the last 168 hours  Recent Results (from the past 240 hour(s))  SURGICAL PCR  SCREEN     Status: None   Collection Time    09/08/13  7:00 PM      Result Value Ref Range Status   MRSA, PCR NEGATIVE  NEGATIVE Final   Staphylococcus aureus NEGATIVE  NEGATIVE Final   Comment:            The Xpert SA Assay (FDA     approved for NASAL specimens     in patients over 62 years of age),     is one component of     a comprehensive surveillance     program.  Test performance has     been validated by [employer] for patients greater     than or equal to 7 year old.     It is not intended     to diagnose infection nor to     guide or monitor treatment.     Studies: Dg Swallowing Func-speech Pathology  09/12/2013   Ephraim Hamburger, CCC-SLP     09/12/2013  6:41 PM Objective Swallowing Evaluation: Modified Barium Swallowing Study    Patient Details  Name: Madison Hickman MRN: [id number] Date of Birth: 10/19/1923  Today's Date: 09/12/2013 Time: 1500-1530 SLP Time Calculation (min): 30 min  Past Medical History:  Past Medical History  Diagnosis Date  . Back pain, chronic   . Headache(784.0)   . Hypertension   . Arthritis   . Meningioma 04/15/2012  . IDA (iron deficiency anemia)   . Diastolic dysfunction Oct 0000000    EF 50-55%   . Hypercalcemia   . Bladder prolapse, female, acquired   . LVH (left ventricular hypertrophy) 03/22/2013    Ejection fraction 50-55%.  . Chronic diastolic heart failure    Past Surgical History:  Past Surgical History  Procedure Laterality Date  . Abdominal hysterectomy    . Arm surgery    . Esophagogastroduodenoscopy  04/18/2012    [id number] hiatal hernia/NO OBVIOUS SOURCE FOR PROFOUND ANEMIA  IDENTIFIED.MODERATE GASTRITIS CONTRIBUTES TO ANEMIA/no definite  stricture identified, empiric dilation performed. + H.PYLORI  . Orif hip fracture Left 09/09/2013    Procedure: OPEN TREATMENT INTERNAL FIXATION LEFT HIP FRACTURE;   Surgeon: Sanjuana Kava, MD;  Location: [facility];  Service:  Orthopedics;  Laterality: Left;   HPI:  Madison Hickman sustained acute left hip fracture,   intertrochanteric, secondary to a fall at the skilled nursing  facility. Status post open treatment internal reduction of  fracture on 09/09/13 by Dr. Luna Glasgow. Pain managed with oral  oxycodone and fentanyl patch. Has been up with PT. Will discharge  to facility with follow up Dr Luna Glasgow 1 months. Staples to be  removed 09/20/13. Toe touch weight bearing with walker. Swallow  evaluation requested due to pain complaining of choking on  saliva.     Assessment / Plan / Recommendation Clinical Impression  Dysphagia Diagnosis: Mild pharyngeal phase dysphagia;Suspected  primary esophageal dysphagia Clinical impression: Madison Hickman presents with mild pharyngeal phase  dysphagia and suspected primary esophageal phase dysphagia  characterized by premature spillage with delay in swallow  initiation with thin liquids resulting in penetration and trace  aspiration of thins during and after the swallow to which pt was  not sensate (no protective cough response elicited). When pt was  cued to cough, she was able to clear the aspirate. Small,  controlled sips prevented aspiration, however some penetration  still occurred. Pt noted to have prominent cricopharyngeus and  air-filled esophagus. Barium tablet became lodged just prior to  LES concerning for stricture. When additional food and liquid  were given behind it, the esophagus filled the distal half and  backflow into cervical esophagus was noted. No backflow into  larynx was observed, however pt certainly at risk. Increased  pharyngeal residue noted once esophagus was filled- ie. swallow  function decreased as study went on. Once pt was moved back to  stretcher (still upright for reflux precautions), she belched and  reported some relief. Recommend D1/puree and nectar-thick  liquids. Upright for all po. Consider GI consult for management  of possible stricture. Purees will likely be tolerated better  than mech soft in regards to current esophageal issues. Pt will  need follow up SLP  services in next venue.    Treatment Recommendation  Defer treatment plan to SLP at (Comment) ([facility])    Diet Recommendation Dysphagia 1 (Puree);Nectar-thick liquid   Liquid Administration via: Cup;Straw Medication Administration: Crushed with puree Supervision: Intermittent supervision to cue for compensatory  strategies Compensations: Small sips/bites;Slow rate Postural Changes and/or Swallow Maneuvers: Seated upright 90  degrees;Upright 30-60 min after meal    Other  Recommendations Recommended Consults: Consider GI  evaluation Oral Care Recommendations: Oral care BID Other Recommendations: Clarify dietary restrictions   Follow Up Recommendations  Skilled Nursing facility    Frequency and Duration              General Date of Onset: 09/12/13 HPI: Madison Hickman sustained acute left hip fracture,  intertrochanteric, secondary to a fall at the skilled nursing  facility. Status post open treatment internal reduction of  fracture on 09/09/13 by Dr. Luna Glasgow. Pain managed with oral  oxycodone and fentanyl patch. Has been up with PT. Will discharge  to facility with follow up Dr Luna Glasgow 1 months. Staples to be  removed 09/20/13. Toe touch weight bearing with walker. Swallow  evaluation requested due to pain complaining of choking on  saliva. Type of Study: Modified Barium Swallowing Study Reason for Referral: Objectively evaluate swallowing function Previous Swallow Assessment: BSE earlier today Diet Prior to this Study: Dysphagia 3 (soft);Thin liquids Temperature Spikes Noted: No Respiratory Status: Room air History of Recent Intubation: No Behavior/Cognition: Alert;Cooperative;Pleasant mood Oral Cavity - Dentition: Edentulous Oral Motor / Sensory Function: Within functional limits Self-Feeding Abilities: Able to feed self with adaptive devices Patient Positioning: Upright in chair Baseline Vocal Quality: Clear Volitional Cough: Strong Volitional Swallow: Able to elicit Anatomy: Within functional limits Pharyngeal  Secretions: Not observed secondary MBS    Reason for Referral Objectively evaluate swallowing function   Oral Phase Oral Preparation/Oral Phase Oral Phase: Impaired Oral - Solids Oral - Mechanical Soft: Piecemeal swallowing;Delayed oral transit  (Pt edentulous)   Pharyngeal Phase Pharyngeal Phase Pharyngeal Phase: Impaired Pharyngeal - Nectar Pharyngeal - Nectar Straw: Delayed swallow initiation;Premature  spillage to valleculae Pharyngeal - Thin Pharyngeal - Thin Teaspoon: Delayed swallow initiation;Premature  spillage to valleculae Pharyngeal -  Thin Cup: Delayed swallow initiation;Premature  spillage to valleculae;Premature spillage to pyriform  sinuses;Penetration/Aspiration during swallow;Trace aspiration Penetration/Aspiration details (thin cup): Material does not  enter airway;Material enters airway, passes BELOW cords without  attempt by patient to eject out (silent aspiration) Pharyngeal - Thin Straw: Delayed swallow initiation;Premature  spillage to valleculae;Premature spillage to pyriform  sinuses;Penetration/Aspiration during swallow;Trace  aspiration;Penetration/Aspiration after swallow Penetration/Aspiration details (thin straw): Material does not  enter airway;Material enters airway, remains ABOVE vocal cords  then ejected out;Material enters airway, passes BELOW cords  without attempt by patient to eject out (silent aspiration) Pharyngeal - Solids Pharyngeal - Puree: Within functional limits Pharyngeal - Mechanical Soft: Within functional limits Pharyngeal - Pill: Within functional limits  Cervical Esophageal Phase    GO    Cervical Esophageal Phase Cervical Esophageal Phase: Impaired Cervical Esophageal Phase - Solids Mechanical Soft: Prominent cricopharyngeal segment;Esophageal  backflow into cervical esophagus Cervical Esophageal Phase - Comment Cervical Esophageal Comment: Air filled esophagus, tertiary  contractions, barium tablet did not pass through LES        Thank you,  Genene Churn,  [place]  PORTER,DABNEY 09/12/2013, 6:41 PM     Scheduled Meds: . amLODipine  5 mg Oral Daily  . enalapril  10 mg Oral BID  . feeding supplement (ENSURE COMPLETE)  237 mL Oral BID BM  . fentaNYL  12.5 mcg Transdermal Q72H  . lidocaine      . lidocaine      . meperidine      . meperidine      . midazolam      . midazolam      . ondansetron      . ondansetron      . pantoprazole  40 mg Oral Daily  . senna-docusate  1 tablet Oral BID  . sertraline  25 mg Oral QHS   Continuous Infusions:   Principal Problem:   Hip fracture, intertrochanteric Active Problems:   Chronic diastolic congestive heart failure   Microcytic anemia   Chronic back pain   Dysphagia   Essential hypertension, benign   Protein-calorie malnutrition, severe   Odynophagia    Time spent: 35 minutes    [facility] Pager [phone]. If 7PM-7AM, please contact night-coverage at [email or web address], password [facility] 09/13/2013, 3:33 PM  LOS: 6 days

## 2013-09-13 NOTE — Progress Notes (Signed)
Physical Therapy Treatment Patient Details Name: Madison Hickman MRN: [id number] DOB: May 17, 1924 Today's Date: 09/13/2013 Time: 2426-8341 PT Time Calculation (min): 23 min 1 TE  PT Assessment / Plan / Recommendation     Patient only able/willing to complete supine bed exercises this morning due to c/o of headache through out supine therex. Nursing notified and patient agreed to attempt recliner transfer at next visit this afternoon once headache is under control.                                          Progress towards PT Goals Progress towards PT goals: Progressing toward goals  Plan      Precautions / Restrictions         Mobility       Exercises General Exercises - Lower Extremity Ankle Circles/Pumps: AROM;Both;Supine;15 reps Quad Sets: AROM;Both;10 reps;Supine Gluteal Sets: AROM;Both;10 reps;Supine Heel Slides: AROM;AAROM;Both;10 reps;Supine (LLE AAROM) Hip ABduction/ADduction: AAROM;Both;10 reps;Supine   PT Diagnosis:    PT Problem List:   PT Treatment Interventions:     PT Goals (current goals can now be found in the care plan section)    Visit Information  Last PT Received On: 09/13/13                   End of Session PT - End of Session Activity Tolerance: Patient limited by pain (c/o of headache and LLE pain;nursing notified) Patient left: in bed;with call bell/phone within reach;with bed alarm set   GP     [place], [place] 09/13/2013, 10:25 AM

## 2013-09-13 NOTE — Progress Notes (Signed)
Pt came from endo and is in NAD. VS are stable BP 123/61, HR 65, temp 97.5, SPO2 92% on RA. Will continue to monitor.

## 2013-09-13 NOTE — Progress Notes (Signed)
Subjective: 4 Days Post-Op Procedure(s) (LRB): OPEN TREATMENT INTERNAL FIXATION LEFT HIP FRACTURE (Left) Patient reports pain as mild.    Objective: Vital signs in last 24 hours: Temp:  [98.1 F (36.7 C)-98.5 F (36.9 C)] 98.1 F (36.7 C) (03/11 0626) Pulse Rate:  [71-77] 71 (03/11 0626) Resp:  [18] 18 (03/11 0626) BP: (122-168)/(80-94) 168/86 mmHg (03/11 0626) SpO2:  [95 %-96 %] 96 % (03/11 0626) Weight:  [47 kg (103 lb 9.9 oz)] 47 kg (103 lb 9.9 oz) (03/11 0626)  Intake/Output from previous day: 03/10 0701 - 03/11 0700 In: 360 [P.O.:360] Out: 500 [Urine:500] Intake/Output this shift:     Recent Labs  09/11/13 0536 09/12/13 0523  HGB 10.6* 10.5*    Recent Labs  09/11/13 0536 09/12/13 0523  WBC 8.7 7.2  RBC 3.67* 3.62*  HCT 31.3* 31.3*  PLT 200 213    Recent Labs  09/12/13 0523 09/13/13 0526  NA 146 143  K 4.6 3.9  CL 114* 110  CO2 22 26  BUN 16 16  CREATININE 0.52 0.55  GLUCOSE 88 91  CALCIUM 10.3 10.2   No results found for this basename: LABPT, INR,  in the last 72 hours  Neurologically intact Neurovascular intact Sensation intact distally Intact pulses distally Dorsiflexion/Plantar flexion intact Incision: no drainage  She has been doing rather slowly in PT.  She has little left hip pain.  She is being worked up for her dysphagia.  She is able to go to SNF as far as her hip is concerned.  Assessment/Plan: 4 Days Post-Op Procedure(s) (LRB): OPEN TREATMENT INTERNAL FIXATION LEFT HIP FRACTURE (Left) Up with therapy  Abiageal Blowe 09/13/2013, 7:27 AM

## 2013-09-14 DIAGNOSIS — E43 Unspecified severe protein-calorie malnutrition: Secondary | ICD-10-CM

## 2013-09-14 MED ORDER — SUCRALFATE 1 GM/10ML PO SUSP: 1.0000 g | Freq: Three times a day (TID) | ORAL | Status: AC

## 2013-09-14 MED ORDER — FLEET ENEMA 7-19 GM/118ML RE ENEM: 1.0000 | ENEMA | Freq: Every day | RECTAL | Status: DC | PRN

## 2013-09-14 NOTE — Discharge Summary (Signed)
Physician Discharge Summary  Madison Hickman ZOX:[id number] DOB: 1923/08/03 DOA: 09/07/2013  PCP: Madison Gravel, MD  Admit date: 09/07/2013 Discharge date: 09/14/2013  Time spent: 40 minutes  Recommendations for Outpatient Follow-up:  1. Dr Madison Hickman 10/16/13 at 9:45am with xrays for surgical follow up. Staples to be removed 09/20/13 2. PCP 2 weeks for evaluation of po intake, odynophagia. carafate for 10 days  Discharge Diagnoses:  Principal Problem:   Hip fracture, intertrochanteric Active Problems:  Dysphagia Odynophagia  Protein-calorie malnutrition, severe    Chronic diastolic congestive heart failure   Microcytic anemia   Chronic back pain    Essential hypertension, benign  Discharge Condition: stable  Diet recommendation: soft dysphagia 1  Filed Weights   09/09/13 0540 09/13/13 0626 09/13/13 1329  Weight: 43.2 kg (95 lb 3.8 oz) 47 kg (103 lb 9.9 oz) 46.72 kg (103 lb)    History of present illness:  This pleasant 80 year old lady presented to the hospital having suffered a fall and she has now fractured her left hip. She lives at the nursing home and fell today. She denied any loss of consciousness. There had been no head injury.  She denied any chest pain, dyspnea or palpitations.   Hospital Course:  1. Acute left hip fracture, intertrochanteric, secondary to a fall at the skilled nursing facility. Status post open treatment internal reduction of fracture on 09/09/13 by Dr. Luna Hickman. Pain managed with po medicine. Continue Fentanyl patch. Follow up with Dr Madison Hickman 4 weeks with xray. Toe touch weight bearing with walker. Staples to be removed 09/20/13.   Odynophagia/Dysphagia: Modified barium swallow results concerning for esophageal stricture. EGD on 09/13/13 with results of skinny esophageal plaques-query esophagitis dissecans-can be seen with pill-induced injury and GERD. Status post passage of a Maloney dilator and subsequent biopsy. Duodenal diverticula. Recommended carafate for 10  days and soft diet. Tolerating diet well at discharge.   Hypertension.Fair control. Pain likely factor. We'll continue amlodipine and enalapril.   Chronic low back pain, on chronic transdermal fentanyl.  Chronic diastolic heart failure, clinically compensated during this hospitalization  Acute blood loss anemia, superimposed on Known iron deficiency/microcytic anemia. S/P 1 unit PRBC's. Hg 10.5 at discharge.   Mild confusion, possibly secondary to opiates, plus/minus mild dementia. Resolved. Appears to be at baseline. Morphine discontinued. Her TSH and vitamin B12 level were within normal limits. Reduced narcotic dose   Hypokalemia: Resolved at discharge.    Procedures: Barium swallow 09/12/13  Open treatment interrnal reduction of left hip intertrochanteric fracture 09/09/13  EGD 09/13/13   Consultations: GI  orthopedics  Discharge Exam: Filed Vitals:   09/14/13 0350  BP: 138/65  Pulse: 61  Temp: 97.7 F (36.5 C)  Resp: 18    General: thin  Frail appears comfortable Cardiovascular: RRR No gallup no rub. No LE edema Respiratory: normal effort. BS clear bilaterally no wheeze Musculoskeletal: dressing to left hip is dry and intact.   Discharge Instructions     Medication List         amLODipine 5 MG tablet  Commonly known as:  NORVASC  Take 1 tablet (5 mg total) by mouth daily. For blood pressure     aspirin EC 81 MG tablet  Take 81 mg by mouth daily.     calcium carbonate 500 MG chewable tablet  Commonly known as:  TUMS - dosed in mg elemental calcium  Chew 2 tablets by mouth every 8 (eight) hours as needed for indigestion or heartburn.     docusate sodium 100 MG  capsule  Commonly known as:  COLACE  Take 100 mg by mouth daily as needed for constipation.     enalapril 10 MG tablet  Commonly known as:  VASOTEC  Take 1 tablet (10 mg total) by mouth 2 (two) times daily.     enoxaparin 30 MG/0.3ML injection  Commonly known as:  LOVENOX  Inject 0.3 mLs (30 mg  total) into the skin daily.     eucerin cream  Apply 1 application topically as needed for dry skin.     feeding supplement (ENSURE COMPLETE) Liqd  Take 237 mLs by mouth 2 (two) times daily between meals.     fentaNYL 12 MCG/HR  Commonly known as:  DURAGESIC - dosed mcg/hr  Place 1 patch (12.5 mcg total) onto the skin every 3 (three) days.     lidocaine 5 %  Commonly known as:  LIDODERM  Place 1 patch onto the skin daily. Remove & Discard patch within 12 hours or as directed by MD     LORazepam 0.5 MG tablet  Commonly known as:  ATIVAN  Take 0.25 mg by mouth every 6 (six) hours as needed for anxiety.     nitroGLYCERIN 0.4 MG SL tablet  Commonly known as:  NITROSTAT  Place 1 tablet (0.4 mg total) under the tongue every 5 (five) minutes as needed for chest pain.     oxyCODONE-acetaminophen 5-325 MG per tablet  Commonly known as:  PERCOCET/ROXICET  Take 1 tablet by mouth every 4 (four) hours as needed for moderate pain or severe pain.     pantoprazole 40 MG tablet  Commonly known as:  PROTONIX  Take 1 tablet (40 mg total) by mouth daily. 30 minutes before breakfast for GERD     sennosides-docusate sodium 8.6-50 MG tablet  Commonly known as:  SENOKOT-S  Take 1 tablet by mouth 2 (two) times daily.     sodium phosphate 7-19 GM/118ML Enem  Place 1 enema rectally daily as needed for severe constipation.     sucralfate 1 GM/10ML suspension  Commonly known as:  CARAFATE  Take 10 mLs (1 g total) by mouth 4 (four) times daily -  with meals and at bedtime.     ZOLOFT 25 MG tablet  Generic drug:  sertraline  Take 25 mg by mouth at bedtime.       No Known Allergies     Follow-up Information   Follow up with Madison Kava, MD In 4 weeks. (for follow up and xrays)    Specialty:  Orthopedic Surgery   Contact information:   [place] [address] [phone]        The results of significant diagnostics from this hospitalization (including imaging,  microbiology, ancillary and laboratory) are listed below for reference.    Significant Diagnostic Studies: Dg Chest 1 View  09/07/2013   CLINICAL DATA:  Fall.  Hip pain  EXAM: CHEST - 1 VIEW  COMPARISON:  03/19/2013  FINDINGS: Cardiac enlargement.  Thoracic aortic aneurysm with tortuosity.  Negative for heart failure. Negative for infiltrate effusion or mass.  IMPRESSION: Cardiac enlargement.  No acute abnormality.   Electronically Signed   By: Franchot Gallo M.D.   On: 09/07/2013 15:10   Dg Hip Complete Left  09/09/2013   CLINICAL DATA:  Status post ORIF of left femur  EXAM: LEFT HIP - COMPLETE 2+ VIEW  COMPARISON:  09/07/2013  FINDINGS: Three images from C-arm radiography obtained in the operating room show changes from ORIF of intertrochanteric fracture  of proximal left femur. There is a dynamic hip screw and sideplate device which reduces the fracture.  IMPRESSION: 1. Status post ORIF of left proximal femur fracture.   Electronically Signed   By: Kerby Moors M.D.   On: 09/09/2013 11:43   Dg Hip Complete Left  09/07/2013   CLINICAL DATA:  Fall  EXAM: LEFT HIP - COMPLETE 2+ VIEW  COMPARISON:  None.  FINDINGS: Intertrochanteric fracture of the left femur with angulation. Left hip joint is satisfactory.  Pessary is present in the vagina.  IMPRESSION: Angulated intertrochanteric fracture left femur   Electronically Signed   By: Franchot Gallo M.D.   On: 09/07/2013 15:13   Dg Swallowing Func-speech Pathology  09/12/2013   Ephraim Hamburger, CCC-SLP     09/12/2013  6:41 PM Objective Swallowing Evaluation: Modified Barium Swallowing Study    Patient Details  Name: Madison Hickman MRN: [id number] Date of Birth: 1924-02-23  Today's Date: 09/12/2013 Time: 1500-1530 SLP Time Calculation (min): 30 min  Past Medical History:  Past Medical History  Diagnosis Date  . Back pain, chronic   . Headache(784.0)   . Hypertension   . Arthritis   . Meningioma 04/15/2012  . IDA (iron deficiency anemia)   . Diastolic dysfunction Oct  2013    EF 50-55%   . Hypercalcemia   . Bladder prolapse, female, acquired   . LVH (left ventricular hypertrophy) 03/22/2013    Ejection fraction 50-55%.  . Chronic diastolic heart failure    Past Surgical History:  Past Surgical History  Procedure Laterality Date  . Abdominal hysterectomy    . Arm surgery    . Esophagogastroduodenoscopy  04/18/2012    [id number] hiatal hernia/NO OBVIOUS SOURCE FOR PROFOUND ANEMIA  IDENTIFIED.MODERATE GASTRITIS CONTRIBUTES TO ANEMIA/no definite  stricture identified, empiric dilation performed. + H.PYLORI  . Orif hip fracture Left 09/09/2013    Procedure: OPEN TREATMENT INTERNAL FIXATION LEFT HIP FRACTURE;   Surgeon: Madison Kava, MD;  Location: [facility];  Service:  Orthopedics;  Laterality: Left;   HPI:  Ms. Jakesha Dalzell sustained acute left hip fracture,  intertrochanteric, secondary to a fall at the skilled nursing  facility. Status post open treatment internal reduction of  fracture on 09/09/13 by Dr. Luna Hickman. Pain managed with oral  oxycodone and fentanyl patch. Has been up with PT. Will discharge  to facility with follow up Dr Madison Hickman 1 months. Staples to be  removed 09/20/13. Toe touch weight bearing with walker. Swallow  evaluation requested due to pain complaining of choking on  saliva.     Assessment / Plan / Recommendation Clinical Impression  Dysphagia Diagnosis: Mild pharyngeal phase dysphagia;Suspected  primary esophageal dysphagia Clinical impression: Ms. Davidov presents with mild pharyngeal phase  dysphagia and suspected primary esophageal phase dysphagia  characterized by premature spillage with delay in swallow  initiation with thin liquids resulting in penetration and trace  aspiration of thins during and after the swallow to which pt was  not sensate (no protective cough response elicited). When pt was  cued to cough, she was able to clear the aspirate. Small,  controlled sips prevented aspiration, however some penetration  still occurred. Pt noted to have prominent  cricopharyngeus and  air-filled esophagus. Barium tablet became lodged just prior to  LES concerning for stricture. When additional food and liquid  were given behind it, the esophagus filled the distal half and  backflow into cervical esophagus was noted. No backflow into  larynx was observed, however pt certainly at risk.  Increased  pharyngeal residue noted once esophagus was filled- ie. swallow  function decreased as study went on. Once pt was moved back to  stretcher (still upright for reflux precautions), she belched and  reported some relief. Recommend D1/puree and nectar-thick  liquids. Upright for all po. Consider GI consult for management  of possible stricture. Purees will likely be tolerated better  than mech soft in regards to current esophageal issues. Pt will  need follow up SLP services in next venue.    Treatment Recommendation  Defer treatment plan to SLP at (Comment) ([facility])    Diet Recommendation Dysphagia 1 (Puree);Nectar-thick liquid   Liquid Administration via: Cup;Straw Medication Administration: Crushed with puree Supervision: Intermittent supervision to cue for compensatory  strategies Compensations: Small sips/bites;Slow rate Postural Changes and/or Swallow Maneuvers: Seated upright 90  degrees;Upright 30-60 min after meal    Other  Recommendations Recommended Consults: Consider GI  evaluation Oral Care Recommendations: Oral care BID Other Recommendations: Clarify dietary restrictions   Follow Up Recommendations  Skilled Nursing facility    Frequency and Duration              General Date of Onset: 09/12/13 HPI: Ms. Angelly Nungaray sustained acute left hip fracture,  intertrochanteric, secondary to a fall at the skilled nursing  facility. Status post open treatment internal reduction of  fracture on 09/09/13 by Dr. Hilda Lias. Pain managed with oral  oxycodone and fentanyl patch. Has been up with PT. Will discharge  to facility with follow up Dr Hilda Lias 1 months. Staples to be  removed 09/20/13.  Toe touch weight bearing with walker. Swallow  evaluation requested due to pain complaining of choking on  saliva. Type of Study: Modified Barium Swallowing Study Reason for Referral: Objectively evaluate swallowing function Previous Swallow Assessment: BSE earlier today Diet Prior to this Study: Dysphagia 3 (soft);Thin liquids Temperature Spikes Noted: No Respiratory Status: Room air History of Recent Intubation: No Behavior/Cognition: Alert;Cooperative;Pleasant mood Oral Cavity - Dentition: Edentulous Oral Motor / Sensory Function: Within functional limits Self-Feeding Abilities: Able to feed self with adaptive devices Patient Positioning: Upright in chair Baseline Vocal Quality: Clear Volitional Cough: Strong Volitional Swallow: Able to elicit Anatomy: Within functional limits Pharyngeal Secretions: Not observed secondary MBS    Reason for Referral Objectively evaluate swallowing function   Oral Phase Oral Preparation/Oral Phase Oral Phase: Impaired Oral - Solids Oral - Mechanical Soft: Piecemeal swallowing;Delayed oral transit  (Pt edentulous)   Pharyngeal Phase Pharyngeal Phase Pharyngeal Phase: Impaired Pharyngeal - Nectar Pharyngeal - Nectar Straw: Delayed swallow initiation;Premature  spillage to valleculae Pharyngeal - Thin Pharyngeal - Thin Teaspoon: Delayed swallow initiation;Premature  spillage to valleculae Pharyngeal - Thin Cup: Delayed swallow initiation;Premature  spillage to valleculae;Premature spillage to pyriform  sinuses;Penetration/Aspiration during swallow;Trace aspiration Penetration/Aspiration details (thin cup): Material does not  enter airway;Material enters airway, passes BELOW cords without  attempt by patient to eject out (silent aspiration) Pharyngeal - Thin Straw: Delayed swallow initiation;Premature  spillage to valleculae;Premature spillage to pyriform  sinuses;Penetration/Aspiration during swallow;Trace  aspiration;Penetration/Aspiration after swallow Penetration/Aspiration details  (thin straw): Material does not  enter airway;Material enters airway, remains ABOVE vocal cords  then ejected out;Material enters airway, passes BELOW cords  without attempt by patient to eject out (silent aspiration) Pharyngeal - Solids Pharyngeal - Puree: Within functional limits Pharyngeal - Mechanical Soft: Within functional limits Pharyngeal - Pill: Within functional limits  Cervical Esophageal Phase    GO    Cervical Esophageal Phase Cervical Esophageal Phase: Impaired Cervical Esophageal Phase - Solids  Mechanical Soft: Prominent cricopharyngeal segment;Esophageal  backflow into cervical esophagus Cervical Esophageal Phase - Comment Cervical Esophageal Comment: Air filled esophagus, tertiary  contractions, barium tablet did not pass through LES        Thank you,  Genene Churn, [place]  [place] 09/12/2013, 6:41 PM    Dg C-arm 1-60 Min  09/09/2013   CLINICAL DATA:  Internal fixation.  Left hip fracture.  EXAM: DG C-ARM 1-60 MIN  FINDINGS: Three intraoperative spot images demonstrate placement dynamic hip screw and lateral plate fixation device across the left femoral intertrochanteric fracture. Anatomic alignment. No hardware or bony complicating feature noted on these intraoperative spot images.  IMPRESSION: Internal fixation across the left femoral intertrochanteric fracture. No visible complicating feature.  : COMPARISON:  09/07/2013   Electronically Signed   By: Rolm Baptise M.D.   On: 09/09/2013 14:18    Microbiology: Recent Results (from the past 240 hour(s))  SURGICAL PCR SCREEN     Status: None   Collection Time    09/08/13  7:00 PM      Result Value Ref Range Status   MRSA, PCR NEGATIVE  NEGATIVE Final   Staphylococcus aureus NEGATIVE  NEGATIVE Final   Comment:            The Xpert SA Assay (FDA     approved for NASAL specimens     in patients over 80 years of age),     is one component of     a comprehensive surveillance     program.  Test performance has     been  validated by [employer] for patients greater     than or equal to 76 year old.     It is not intended     to diagnose infection nor to     guide or monitor treatment.     Labs: Basic Metabolic Panel:  Recent Labs Lab 09/09/13 0558 09/10/13 3295 09/11/13 0536 09/12/13 0523 09/13/13 0526  NA 143 144 144 146 143  K 3.7 3.5* 3.5* 4.6 3.9  CL 108 110 112 114* 110  CO2 23 23 23 22 26   GLUCOSE 102* 126* 96 88 91  BUN 8 12 15 16 16   CREATININE 0.54 0.53 0.54 0.52 0.55  CALCIUM 10.4 10.1 9.9 10.3 10.2   Liver Function Tests:  Recent Labs Lab 09/08/13 0525  AST 15  ALT 8  ALKPHOS 51  BILITOT 0.5  PROT 5.7*  ALBUMIN 3.0*   No results found for this basename: LIPASE, AMYLASE,  in the last 168 hours No results found for this basename: AMMONIA,  in the last 168 hours CBC:  Recent Labs Lab 09/07/13 1448  09/08/13 2042 09/09/13 0558 09/10/13 0637 09/11/13 0536 09/12/13 0523  WBC 11.9*  < > 8.0 9.7 9.5 8.7 7.2  NEUTROABS 10.2*  --  5.9  --  7.7 6.6 5.1  HGB 10.7*  < > 11.0* 11.4* 9.5* 10.6* 10.5*  HCT 33.0*  < > 32.9* 34.5* 28.9* 31.3* 31.3*  MCV 86.4  < > 86.4 86.0 86.0 85.3 86.5  PLT 284  < > 215 227 206 200 213  < > = values in this interval not displayed. Cardiac Enzymes: No results found for this basename: CKTOTAL, CKMB, CKMBINDEX, TROPONINI,  in the last 168 hours BNP: BNP (last 3 results)  Recent Labs  11/04/12 1245 11/10/12 1726  PROBNP 1050.0* 1130.0*   CBG: No results found for this basename: GLUCAP,  in the  last 168 hours     Signed:  Radene Gunning  [facility] 09/14/2013, 9:56 AM

## 2013-09-14 NOTE — Progress Notes (Signed)
Subjective: 1 Day Post-Op Procedure(s) (LRB): ESOPHAGOGASTRODUODENOSCOPY (EGD) WITH ESOPHAGEAL DILATION (N/A) Patient reports pain as mild.    Objective: Vital signs in last 24 hours: Temp:  [97.5 F (36.4 C)-98.3 F (36.8 C)] 97.7 F (36.5 C) (03/12 0350) Pulse Rate:  [60-84] 61 (03/12 0350) Resp:  [15-25] 18 (03/12 0350) BP: (115-184)/(61-98) 138/65 mmHg (03/12 0350) SpO2:  [92 %-97 %] 93 % (03/12 0350) Weight:  [46.72 kg (103 lb)] 46.72 kg (103 lb) (03/11 1329)  Intake/Output from previous day: 03/11 0701 - 03/12 0700 In: 845 [P.O.:120; I.V.:725] Out: 450 [Urine:450] Intake/Output this shift:     Recent Labs  09/12/13 0523  HGB 10.5*    Recent Labs  09/12/13 0523  WBC 7.2  RBC 3.62*  HCT 31.3*  PLT 213    Recent Labs  09/12/13 0523 09/13/13 0526  NA 146 143  K 4.6 3.9  CL 114* 110  CO2 22 26  BUN 16 16  CREATININE 0.52 0.55  GLUCOSE 88 91  CALCIUM 10.3 10.2   No results found for this basename: LABPT, INR,  in the last 72 hours  Neurologically intact Neurovascular intact Sensation intact distally Intact pulses distally Dorsiflexion/Plantar flexion intact Incision: no drainage  Her hip is not painful.  She had a GI procedure yesterday for dysphagia.  Resume PT today. Assessment/Plan: 1 Day Post-Op Procedure(s) (LRB): ESOPHAGOGASTRODUODENOSCOPY (EGD) WITH ESOPHAGEAL DILATION (N/A) Up with therapy  Madison Hickman 09/14/2013, 8:05 AM

## 2013-09-14 NOTE — Discharge Summary (Signed)
Patient seen and examined this morning. Agree with discharge assessment and plan outlined above.  Stable for  d/c to SNF with outpt ortho  follow up.

## 2013-09-14 NOTE — Progress Notes (Signed)
Pt is to be discharged back to [facility] today. Pt is in NAD, IV is out, all paperwork has been reviewed/discussed with patient, and there are no questions/concerns at this time. Assessment is unchanged from this morning. Report has been called to receiving nurse. Pt is to be accompanied downstairs by staff via EMS.

## 2013-09-14 NOTE — Clinical Social Work Note (Signed)
Pt d/c today back to [facility]. Pt, daughter, and facility aware and agreeable. D/C summary faxed. Pt to transfer via [facility].  Benay Pike, [place]

## 2013-09-15 ENCOUNTER — Encounter (HOSPITAL_COMMUNITY): Payer: Self-pay | Admitting: Internal Medicine

## 2013-09-17 ENCOUNTER — Encounter: Payer: Self-pay | Admitting: Internal Medicine

## 2013-09-20 NOTE — Discharge Summary (Signed)
Please see the updated discharge summary dictated on 09/14/2013. The patient was seen and examined. Agree with NP Ms. Alfonso Ramus assessment and plan.

## 2013-10-10 IMAGING — CT CT ABD-PELV W/ CM
2 of 5 series · 15 of 46 positions shown, 17 images · IV contrast (Omnipaque 300)
Comparison: 02/14/2012

CLINICAL DATA: Abdominal pain.  Confusion.  Iron deficiency anemia.
Query colon mass.

CT ABDOMEN AND PELVIS WITH CONTRAST
TECHNIQUE: Multidetector CT imaging of the abdomen and pelvis was
performed following the standard protocol during bolus
administration of intravenous contrast.
Contrast: 100mL OMNIPAQUE IOHEXOL 300 MG/ML  SOLN

[Series 2: abd_pel_with 5.0 b40f · axial · 0.61mm/px · z∈[-368,-32]mm · 12 of 75 slices shown, 14 images]
[im 4/75  soft-tissue]
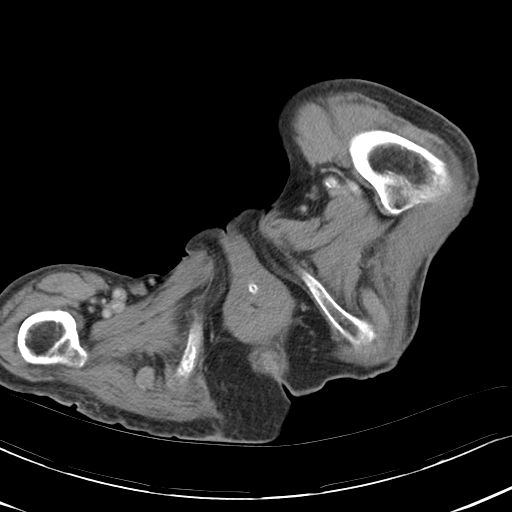
[im 4/75  bone]
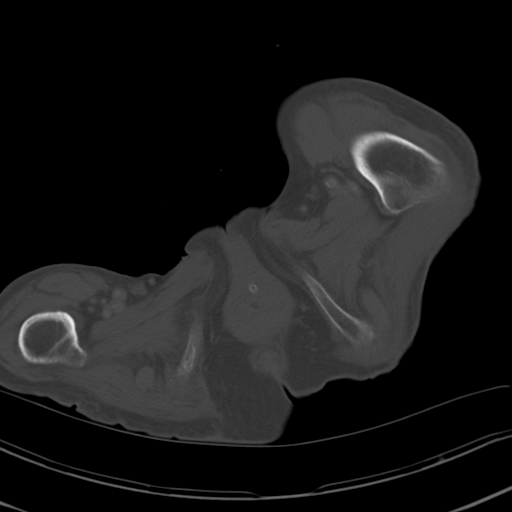
[im 12/75  soft-tissue]
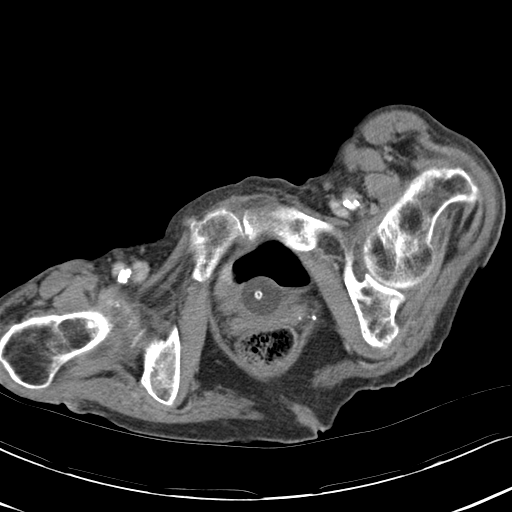
[im 16/75  soft-tissue]
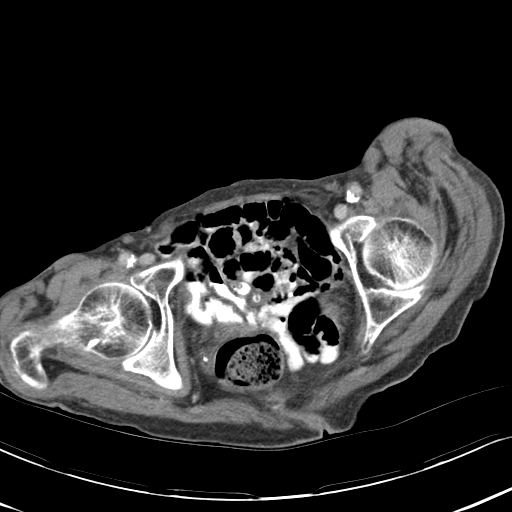
[im 24/75  soft-tissue]
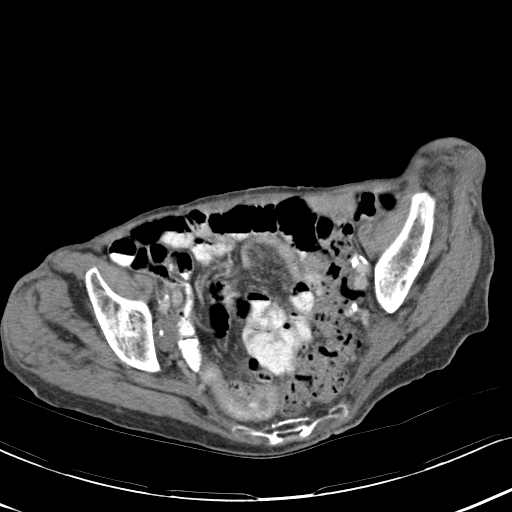
[im 28/75  soft-tissue]
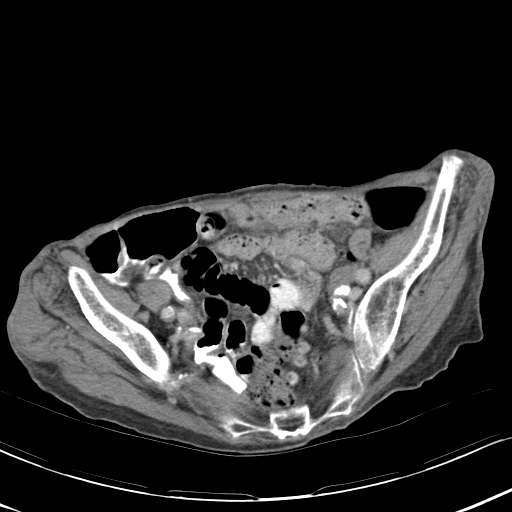
[im 36/75  soft-tissue]
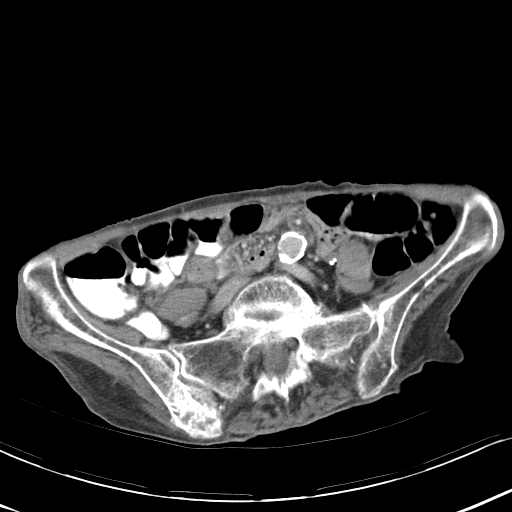
[im 39/75  soft-tissue]
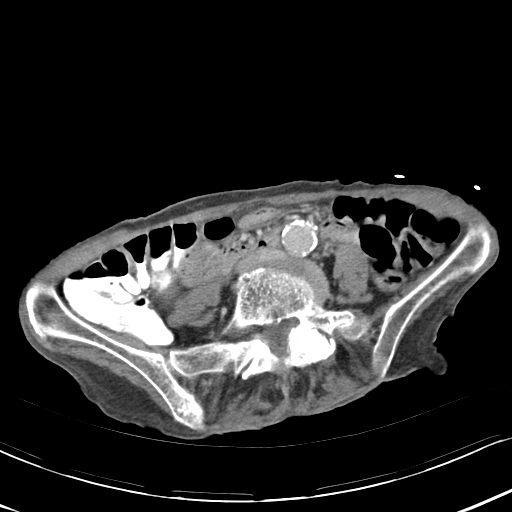
[im 47/75  soft-tissue]
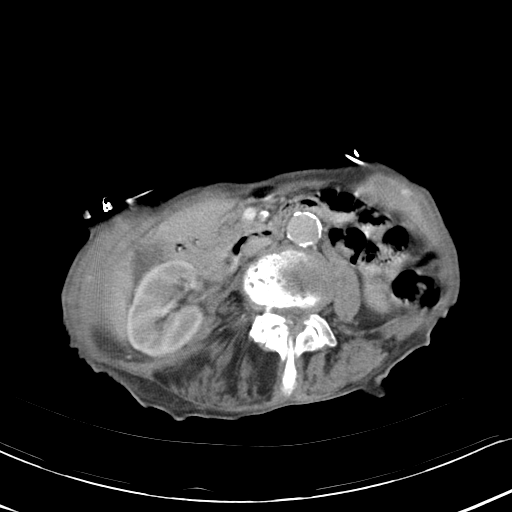
[im 51/75  soft-tissue]
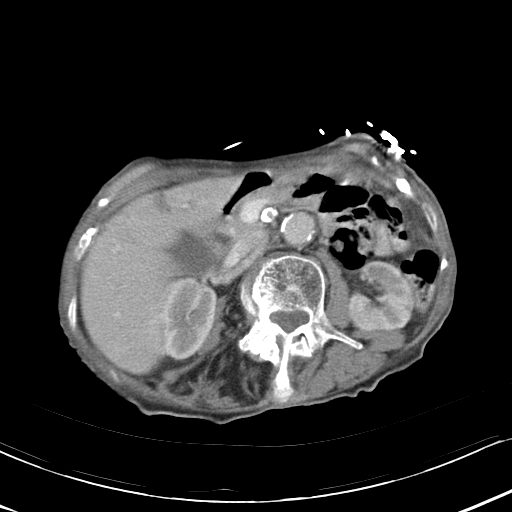
[im 51/75  bone]
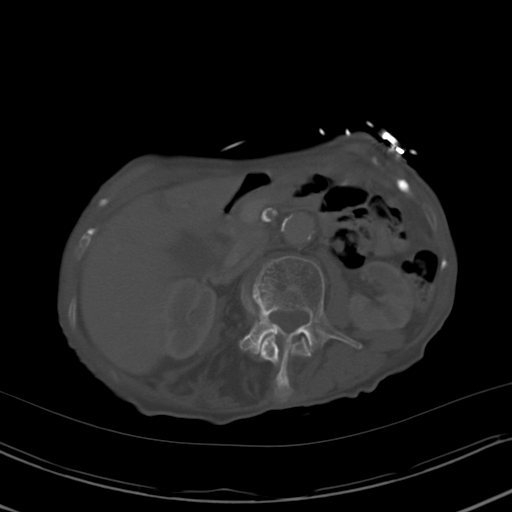
[im 59/75  soft-tissue]
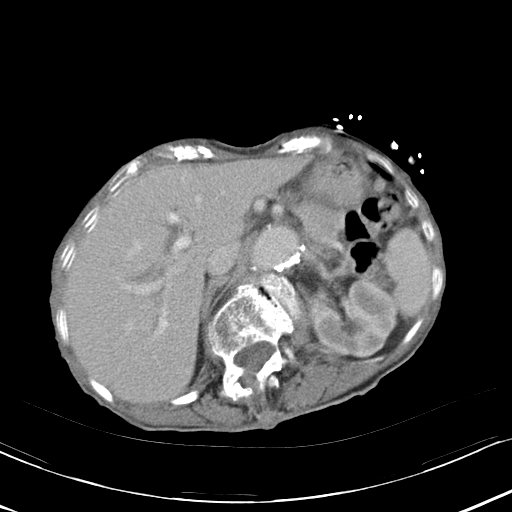
[im 63/75  soft-tissue]
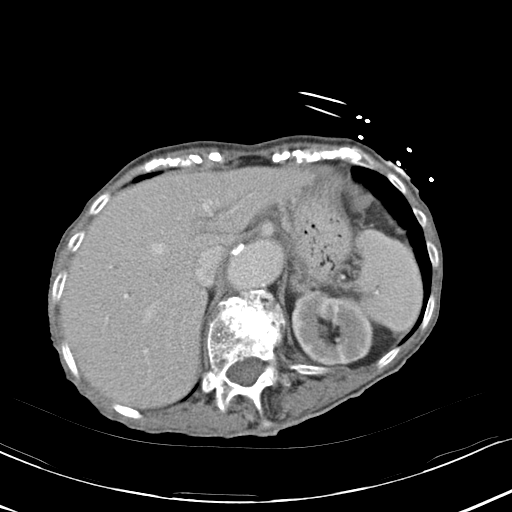
[im 71/75  soft-tissue]
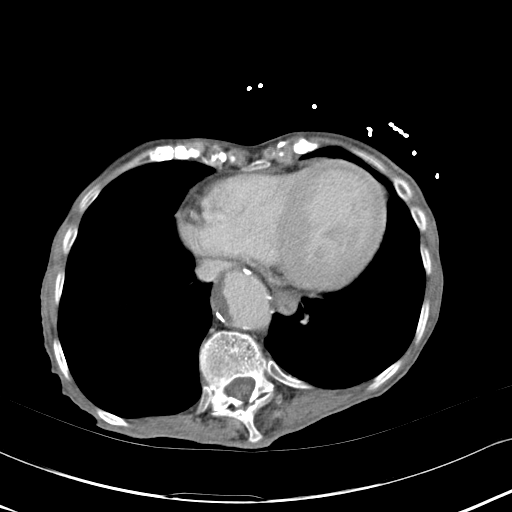

[Series 4: abd_pel_with 3.0 spo · coronal · 0.61mm/px · 3 of 62 slices shown]
[im 21/62  soft-tissue]
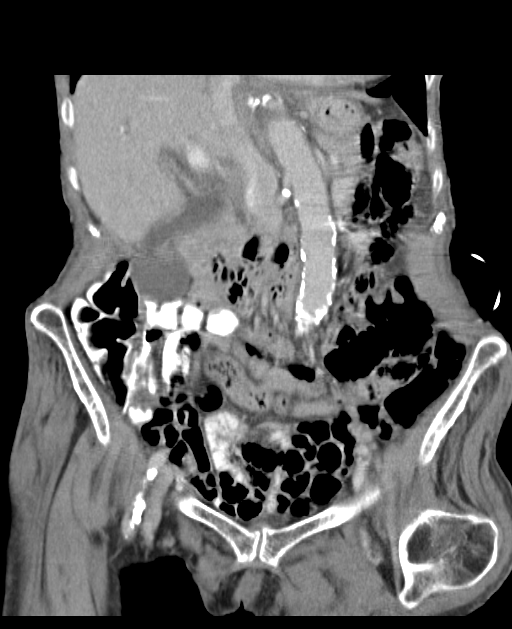
[im 28/62  soft-tissue]
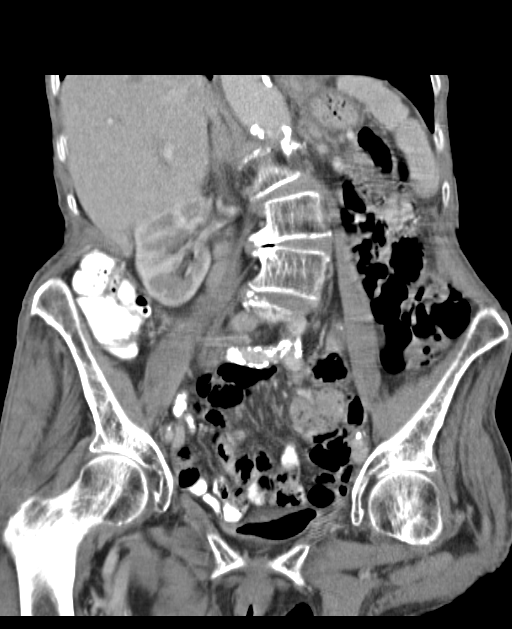
[im 34/62  soft-tissue]
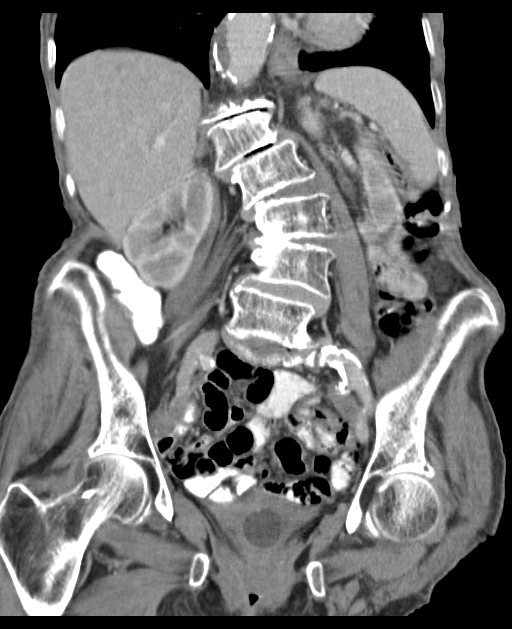

[15 of 46 positions shown; findings below may reference images not displayed]

FINDINGS: Dextroconvex lower thoracic and levoconvex lumbar
scoliosis noted with rotary component.

Emphysema noted at the lung bases.  Cardiomegaly noted with mild
pectus excavatum and ectatic lower thoracic aorta with mural
thrombus.

Despite efforts by the patient and technologist, motion artifact is
present on some series of today's examination and could not be
totally eliminated.  This reduces diagnostic sensitivity and
specificity.

1.1 cm hypodense lesion in the dome of segment seven on image 7 of
series 2, technically nonspecific.  A 1.6 cm in diameter cyst is
present in the segment eight.  Several additional small hypodense
lesions are present scattered in the liver, technically too small
to characterize.

Mild intrahepatic biliary dilatation noted.  The common bile duct
is within normal limits for age.  Gallbladder obscured by motion
artifact.

Spleen unremarkable.  Fullness of the left adrenal gland noted
without well-defined mass.  Punctate calcification along the right
inferior renal hilum, image 31 of series 2, favors vascular
calcification over nonobstructive calculus.

Mild distal esophageal wall thickening is present. There is a
paucity of intra-abdominal fat, leading to poor separation of
adjacent structures which reduces diagnostic sensitivity and
specificity.

Irregular speckled density and lucency in the right iliac bone,
query prior graft harvesting, alternatively a chondroid lesion.
Roughly similar appearance on 05/08/2008.

A Foley catheters present in the urinary bladder which is otherwise
empty.

Oral contrast medium makes its way to the right colon.  No dilated
small bowel.
IMPRESSION: 1.  Scoliosis.
2.  Emphysema.
3.  Cardiomegaly.
4.  Hypodense lesions in the liver appear to represent cysts or
large enough to be characterized.  Smaller hepatic lesions are
technically nonspecific although statistically likely to be cysts.
5.  Mild intrahepatic biliary dilatation, etiology uncertain.
Common bile duct appears within normal limits.  No Klatskin tumor
observed.
6.  Mild circumferential wall thickening in the distal esophagus,
query esophagitis.
7.  Speckled irregular density lucency in the right iliac bone.
This could represent a chondroid lesion or prior graft harvesting
site, correlate with the history of graft harvesting.  This lesion
measures up to 3.8 cm in diameter, and accordingly may warrant
observation, although coarse and trabeculae in this vicinity on
prior lumbar spine radiographs of 6007 suggests that it is present
at that time.
8.  Although I do not observe the colon mass, please note that non-
virtual colonography CT is not a highly sensitive way to assess for
various forms of colon malignancy.  Colonoscopy may be warranted.

## 2013-10-23 ENCOUNTER — Encounter: Payer: Self-pay | Admitting: Obstetrics and Gynecology

## 2013-10-23 ENCOUNTER — Ambulatory Visit (INDEPENDENT_AMBULATORY_CARE_PROVIDER_SITE_OTHER): Payer: Medicare Other | Admitting: Obstetrics and Gynecology

## 2013-10-23 VITALS — BP 140/88

## 2013-10-23 DIAGNOSIS — N993 Prolapse of vaginal vault after hysterectomy: Secondary | ICD-10-CM

## 2013-10-23 DIAGNOSIS — Z4689 Encounter for fitting and adjustment of other specified devices: Secondary | ICD-10-CM | POA: Insufficient documentation

## 2013-10-23 NOTE — Progress Notes (Signed)
This chart was scribed by Ludger Nutting, Medical Scribe, for Dr. Mallory Shirk on 10/23/13 at 11:08 AM. This chart was reviewed by Dr. Mallory Shirk and is accurate.   [facility] Visit  Patient name: Madison Hickman MRN [id number]  Date of birth: 12-13-1923  CC & HPI:  Madison Hickman is a 80 y.o. female presenting today for follow up visit. Patient was last seen in October 2015 for follow up for a pessary insertion.   She has a history of right hip fracture in March 2015.   ROS:  Negative except noted above.   Pertinent History Reviewed:  Medical & Surgical Hx:  Reviewed: Significant for  Past Medical History  Diagnosis Date  . Back pain, chronic   . Headache(784.0)   . Hypertension   . Arthritis   . Meningioma 04/15/2012  . IDA (iron deficiency anemia)   . Diastolic dysfunction Oct 7505    EF 50-55%   . Hypercalcemia   . Bladder prolapse, female, acquired   . LVH (left ventricular hypertrophy) 03/22/2013    Ejection fraction 50-55%.  . Chronic diastolic heart failure     Past Surgical History  Procedure Laterality Date  . Abdominal hysterectomy    . Arm surgery    . Esophagogastroduodenoscopy  04/18/2012    XGZ:FPOIP hiatal hernia/NO OBVIOUS SOURCE FOR PROFOUND ANEMIA IDENTIFIED.MODERATE GASTRITIS CONTRIBUTES TO ANEMIA/no definite stricture identified, empiric dilation performed. + H.PYLORI  . Orif hip fracture Left 09/09/2013    Procedure: OPEN TREATMENT INTERNAL FIXATION LEFT HIP FRACTURE;  Surgeon: Sanjuana Kava, MD;  Location: [facility];  Service: Orthopedics;  Laterality: Left;  . Esophagogastroduodenoscopy (egd) with esophageal dilation N/A 09/13/2013    Procedure: ESOPHAGOGASTRODUODENOSCOPY (EGD) WITH ESOPHAGEAL DILATION;  Surgeon: Daneil Dolin, MD;  Location: [facility];  Service: Endoscopy;  Laterality: N/A;    Medications: Reviewed & Updated - see associated section Social History: Reviewed -  reports that she has never smoked. She does not have any smokeless  tobacco history on file.  Objective Findings:  Vitals: BP 140/88  Physical Examination: General appearance - alert, well appearing, and in no distress and oriented to person, place, and time Pelvic -  VULVA: normal appearing vulva with no masses, tenderness or lesions,  VAGINA: normal appearing vagina with normal color and discharge, no lesions, gellhorn pessary in place,  CERVIX: surgically absent,  UTERUS: surgically absent, vaginal cuff well healed,  ADNEXA: normal adnexa in size, nontender and no masses   Assessment & Plan:   A: 1. Pessary recheck, good support  2. No evidence of acute infection   P: 1. Recheck in 1 year.

## 2013-11-02 ENCOUNTER — Ambulatory Visit (INDEPENDENT_AMBULATORY_CARE_PROVIDER_SITE_OTHER): Payer: Medicare Other | Admitting: Gastroenterology

## 2013-11-02 ENCOUNTER — Telehealth: Payer: Self-pay | Admitting: Gastroenterology

## 2013-11-02 ENCOUNTER — Encounter: Payer: Self-pay | Admitting: Gastroenterology

## 2013-11-02 ENCOUNTER — Encounter (INDEPENDENT_AMBULATORY_CARE_PROVIDER_SITE_OTHER): Payer: Self-pay

## 2013-11-02 VITALS — BP 120/70 | HR 75 | Temp 97.6°F | Ht 60.0 in

## 2013-11-02 DIAGNOSIS — R1319 Other dysphagia: Secondary | ICD-10-CM

## 2013-11-02 DIAGNOSIS — R1310 Dysphagia, unspecified: Secondary | ICD-10-CM

## 2013-11-02 NOTE — Patient Instructions (Addendum)
I need to request the speech reports from [facility].   Continue to take Prilosec each morning, 30 minutes before breakfast. Continue swallowing precautions per Speech therapy.   I have ordered a special swallow test to see how your esophagus moves and if there is any area of stricture.

## 2013-11-02 NOTE — Progress Notes (Signed)
Spoke with Santiago Glad, speech pathology, at [facility] regarding patient. States she has dry coughing, complains of pain after eating. Just worked with patient today. Santiago Glad was not aware that she had already undergone dilation.

## 2013-11-02 NOTE — Assessment & Plan Note (Addendum)
80 year old female with dysphagia and previous abnormal MBSS in March 2015. Speech Pathology notes from [facility] not available at time of visit. I have contacted [facility] on 2 separate occasions today and unable to speak directly with the speech therapist. Nursing does state that she has difficulty with meds and liquids. Likely dealing with age-related changes; she has had a recent EGD with empiric dilation. Will proceed with BPE. Likely options are limited for patient at this time; doubt a repeat procedure is indicated currently.

## 2013-11-02 NOTE — Telephone Encounter (Signed)
OPEN IN ERROR 

## 2013-11-02 NOTE — Progress Notes (Signed)
Referring Provider: Jani Gravel, MD Primary Care Physician:  Jani Gravel, MD Primary GI: Dr. Gala Romney  Chief Complaint  Patient presents with  . Dysphagia    HPI:   Madison Hickman presents today with a history of dysphagia, seen while inpatient at [facility] on September 13, 2013. Due to complaints of choking, she had speech therapy evaluation including MBSS on 09/12/13. Barium tablet became lodged above the LES concerning for stricture. When additional food and liquid was given behind it, the esophagus filled the distal half and backflow into the cervical esophagus was noted. She was felt to be at risk for backflow into the larynx.  For this reason, EGD performed with no obvious stricture noted. Empiric dilation performed. Esophagus with ribbon-like plaques covering esophagus diffusely. Path with mild inflammation. Query pill-induced injury possibly. She presents today with an aide, but the aide is unclear why she is here in follow-up. I have no records at time of visit.   Patient notes intermittent dysphagia, difficult to ascertain whether oropharyngeal or esophageal. No abdominal pain. Complains of back pain. Feels like food "stops somewhere". Vague nausea. Prilosec 40 mg daily.   Spoke with primary nurse for patient. States patient has difficulty swallowing meds, even difficulty with liquids.    Past Medical History  Diagnosis Date  . Back pain, chronic   . Headache(784.0)   . Hypertension   . Arthritis   . Meningioma 04/15/2012  . IDA (iron deficiency anemia)   . Diastolic dysfunction Oct 5573    EF 50-55%   . Hypercalcemia   . Bladder prolapse, female, acquired   . LVH (left ventricular hypertrophy) 03/22/2013    Ejection fraction 50-55%.  . Chronic diastolic heart failure     Past Surgical History  Procedure Laterality Date  . Abdominal hysterectomy    . Arm surgery    . Esophagogastroduodenoscopy  04/18/2012    UKG:URKYH hiatal hernia/NO OBVIOUS SOURCE FOR PROFOUND  ANEMIA IDENTIFIED.MODERATE GASTRITIS CONTRIBUTES TO ANEMIA/no definite stricture identified, empiric dilation performed. + H.PYLORI  . Orif hip fracture Left 09/09/2013    Procedure: OPEN TREATMENT INTERNAL FIXATION LEFT HIP FRACTURE;  Surgeon: Sanjuana Kava, MD;  Location: [facility];  Service: Orthopedics;  Laterality: Left;  . Esophagogastroduodenoscopy (egd) with esophageal dilation N/A 09/13/2013    Dr. Rourk:thin, linear ribbon-like plaques covering esophagus diffusely, esophagus patent, D1 and D2 examined with 2 juxta ampullary duodenal diverticula, s/p 52 F dilation , path with slight inflammation but no abnormalities    Current Outpatient Prescriptions  Medication Sig Dispense Refill  . amLODipine (NORVASC) 5 MG tablet Take 1 tablet (5 mg total) by mouth daily. For blood pressure  30 tablet  3  . aspirin EC 81 MG tablet Take 81 mg by mouth daily.      . calcium carbonate (TUMS - DOSED IN MG ELEMENTAL CALCIUM) 500 MG chewable tablet Chew 2 tablets by mouth every 8 (eight) hours as needed for indigestion or heartburn.      . Cholecalciferol (VITAMIN D-3) 1000 UNITS CAPS Take by mouth.      . docusate sodium (COLACE) 100 MG capsule Take 100 mg by mouth daily as needed for constipation.      . enalapril (VASOTEC) 10 MG tablet Take 1 tablet (10 mg total) by mouth 2 (two) times daily.  60 tablet  3  . feeding supplement, ENSURE COMPLETE, (ENSURE COMPLETE) LIQD Take 237 mLs by mouth 2 (two) times daily between meals.      Marland Kitchen  fentaNYL (DURAGESIC - DOSED MCG/HR) 12 MCG/HR Place 1 patch (12.5 mcg total) onto the skin every 3 (three) days.  5 patch  0  . lidocaine (LIDODERM) 5 % Place 1 patch onto the skin daily. Remove & Discard patch within 12 hours or as directed by MD      . LORazepam (ATIVAN) 0.5 MG tablet Take 0.25 mg by mouth every 6 (six) hours as needed for anxiety.      . nitroGLYCERIN (NITROSTAT) 0.4 MG SL tablet Place 1 tablet (0.4 mg total) under the tongue every 5 (five) minutes as needed for  chest pain.      Marland Kitchen omeprazole (PRILOSEC) 40 MG capsule Take 40 mg by mouth daily.      Marland Kitchen oxyCODONE-acetaminophen (PERCOCET/ROXICET) 5-325 MG per tablet Take 1 tablet by mouth every 4 (four) hours as needed for moderate pain or severe pain.  30 tablet  0  . polyethylene glycol (MIRALAX / GLYCOLAX) packet Take 17 g by mouth daily.      . sennosides-docusate sodium (SENOKOT-S) 8.6-50 MG tablet Take 1 tablet by mouth 2 (two) times daily.      . sertraline (ZOLOFT) 25 MG tablet Take 25 mg by mouth at bedtime.      . Skin Protectants, Misc. (EUCERIN) cream Apply 1 application topically as needed for dry skin.      . sodium phosphate (FLEET) 7-19 GM/118ML ENEM Place 1 enema rectally daily as needed for severe constipation.    0  . sucralfate (CARAFATE) 1 GM/10ML suspension Take 10 mLs (1 g total) by mouth 4 (four) times daily -  with meals and at bedtime.  420 mL  0  . enoxaparin (LOVENOX) 30 MG/0.3ML injection Inject 0.3 mLs (30 mg total) into the skin daily.  0 Syringe     No current facility-administered medications for this visit.    Allergies as of 11/02/2013  . (No Known Allergies)      Review of Systems: As mentioned in HPI.   Physical Exam: BP 120/70  Pulse 75  Temp(Src) 97.6 F (36.4 C) (Oral)  Ht 5' (1.524 m) General:   Alert and oriented to person only. Thin, frail-appearing.  Eyes:  Conjuctiva clear without scleral icterus. Mouth:  Oral mucosa pink and moist. Edentulous. Heart:  S1, S2 present without murmurs, Abdomen:  Limited exam with patient sitting in wheelchair. No obvious mass or tenderness to palpation. Extremities:  Without edema. Neuro: Alert and oriented to person only Skin: fragile

## 2013-11-06 ENCOUNTER — Ambulatory Visit (HOSPITAL_COMMUNITY)
Admission: RE | Admit: 2013-11-06 | Discharge: 2013-11-06 | Disposition: A | Discharge status: Home / Self Care | Payer: Medicare Other | Source: Ambulatory Visit | Attending: Gastroenterology | Admitting: Gastroenterology

## 2013-11-06 DIAGNOSIS — K224 Dyskinesia of esophagus: Secondary | ICD-10-CM | POA: Insufficient documentation

## 2013-11-06 DIAGNOSIS — R1319 Other dysphagia: Secondary | ICD-10-CM

## 2013-11-06 DIAGNOSIS — R1310 Dysphagia, unspecified: Secondary | ICD-10-CM | POA: Insufficient documentation

## 2013-11-07 NOTE — Progress Notes (Signed)
cc'd to pcp 

## 2013-11-14 NOTE — Progress Notes (Signed)
Quick Note:  BPE reviewed. No obvious stricture. Her symptoms are likely secondary to esophageal dysmotility as suspected.  I recommend Speech Pathology evaluation, with dysphagia diet. Other supportive measures per Speech. No indication for repeat EGD. ______

## 2013-11-15 ENCOUNTER — Telehealth: Payer: Self-pay | Admitting: Gastroenterology

## 2013-11-15 DIAGNOSIS — A048 Other specified bacterial intestinal infections: Secondary | ICD-10-CM

## 2013-11-15 NOTE — Telephone Encounter (Signed)
Can we call patient's facility and see if she took the Prevpac for H.pylori in the past? I just want to make sure it was completed.

## 2013-11-20 NOTE — Progress Notes (Signed)
Quick Note:  Yes, please arrange eval with speech. ______

## 2013-11-23 NOTE — Telephone Encounter (Signed)
Pt was at the [facility] center in 2013 when she would have been treated for the H.pylori. She is now at [facility]. I called the medical records department at the [facility] but the lady was not there today. We will have to call back Friday and check with her.

## 2013-11-30 NOTE — Telephone Encounter (Signed)
Faxed request to [place] records.

## 2013-12-01 NOTE — Telephone Encounter (Signed)
Routing to [place], please let me know when results come in.

## 2013-12-13 NOTE — Progress Notes (Signed)
Susan, please schedule. 

## 2013-12-13 NOTE — Telephone Encounter (Signed)
We just need to get an H.pylori stool antigen on file. Needs to be off PPI X 14 days.

## 2013-12-13 NOTE — Progress Notes (Signed)
Patient discharged from speech therapy on 11/07/13 due to consistent progress with skilled interventions. Pharyngeal swallow ability improved overall but continued signs and symptoms of primary esophageal based dysphagia. Puree consistency thought to be best at this time. Needs distant supervision for meals.   Please have patient return in 3 months.

## 2013-12-15 NOTE — Progress Notes (Signed)
Reminder in epic °

## 2013-12-25 ENCOUNTER — Other Ambulatory Visit: Payer: Self-pay

## 2013-12-25 DIAGNOSIS — A048 Other specified bacterial intestinal infections: Secondary | ICD-10-CM

## 2013-12-25 NOTE — Telephone Encounter (Signed)
Letter and lab order have been done and faxed to [facility].   Routing to Bonnie as an Pharmacist, hospital, this is a [facility] pt.

## 2014-01-04 ENCOUNTER — Other Ambulatory Visit (HOSPITAL_COMMUNITY): Payer: Self-pay | Admitting: Internal Medicine

## 2014-01-04 DIAGNOSIS — R51 Headache: Principal | ICD-10-CM

## 2014-01-04 DIAGNOSIS — R519 Headache, unspecified: Secondary | ICD-10-CM

## 2014-01-04 DIAGNOSIS — R4689 Other symptoms and signs involving appearance and behavior: Secondary | ICD-10-CM

## 2014-01-09 ENCOUNTER — Ambulatory Visit (HOSPITAL_COMMUNITY): Admission: RE | Admit: 2014-01-09 | Payer: Medicare Other | Source: Ambulatory Visit

## 2014-01-22 ENCOUNTER — Ambulatory Visit (HOSPITAL_COMMUNITY)
Admission: RE | Admit: 2014-01-22 | Discharge: 2014-01-22 | Disposition: A | Discharge status: Home / Self Care | Payer: Medicare Other | Source: Ambulatory Visit | Attending: Internal Medicine | Admitting: Internal Medicine

## 2014-01-22 DIAGNOSIS — G939 Disorder of brain, unspecified: Secondary | ICD-10-CM | POA: Insufficient documentation

## 2014-01-22 DIAGNOSIS — R4689 Other symptoms and signs involving appearance and behavior: Secondary | ICD-10-CM

## 2014-01-22 DIAGNOSIS — R51 Headache: Secondary | ICD-10-CM | POA: Insufficient documentation

## 2014-01-22 DIAGNOSIS — R519 Headache, unspecified: Secondary | ICD-10-CM

## 2014-03-02 ENCOUNTER — Encounter: Payer: Self-pay | Admitting: Internal Medicine

## 2014-03-07 ENCOUNTER — Telehealth: Payer: Self-pay | Admitting: Internal Medicine

## 2014-03-16 ENCOUNTER — Other Ambulatory Visit (HOSPITAL_COMMUNITY): Payer: Self-pay | Admitting: Internal Medicine

## 2014-03-16 DIAGNOSIS — R1310 Dysphagia, unspecified: Secondary | ICD-10-CM

## 2014-03-27 ENCOUNTER — Inpatient Hospital Stay (HOSPITAL_COMMUNITY): Admission: RE | Admit: 2014-03-27 | Payer: Medicare Other | Source: Ambulatory Visit

## 2014-03-27 ENCOUNTER — Telehealth (HOSPITAL_COMMUNITY): Payer: Self-pay

## 2014-03-27 ENCOUNTER — Ambulatory Visit (HOSPITAL_COMMUNITY): Payer: Medicare Other | Admitting: Speech Pathology

## 2014-03-27 NOTE — Telephone Encounter (Signed)
Message left from [facility] in Radiology that the [facility] has been cancelled.

## 2014-04-05 ENCOUNTER — Encounter (HOSPITAL_COMMUNITY): Payer: Medicare Other | Admitting: Speech Pathology

## 2014-04-12 ENCOUNTER — Encounter: Payer: Self-pay | Admitting: Gastroenterology

## 2014-04-12 ENCOUNTER — Ambulatory Visit: Payer: Medicare Other | Admitting: Gastroenterology

## 2014-06-20 ENCOUNTER — Encounter (HOSPITAL_COMMUNITY): Payer: Self-pay | Admitting: *Deleted

## 2014-06-20 ENCOUNTER — Emergency Department (HOSPITAL_COMMUNITY): Payer: Medicare Other

## 2014-06-20 ENCOUNTER — Emergency Department (HOSPITAL_COMMUNITY)
Admission: EM | Admit: 2014-06-20 | Discharge: 2014-06-20 | Disposition: A | Discharge status: Other Acute Inpatient Hospital | Payer: Medicare Other | Attending: Emergency Medicine | Admitting: Emergency Medicine

## 2014-06-20 DIAGNOSIS — R05 Cough: Secondary | ICD-10-CM | POA: Insufficient documentation

## 2014-06-20 DIAGNOSIS — Z862 Personal history of diseases of the blood and blood-forming organs and certain disorders involving the immune mechanism: Secondary | ICD-10-CM | POA: Insufficient documentation

## 2014-06-20 DIAGNOSIS — Z7982 Long term (current) use of aspirin: Secondary | ICD-10-CM | POA: Insufficient documentation

## 2014-06-20 DIAGNOSIS — I5032 Chronic diastolic (congestive) heart failure: Secondary | ICD-10-CM | POA: Diagnosis not present

## 2014-06-20 DIAGNOSIS — G8929 Other chronic pain: Secondary | ICD-10-CM | POA: Diagnosis not present

## 2014-06-20 DIAGNOSIS — I10 Essential (primary) hypertension: Secondary | ICD-10-CM | POA: Insufficient documentation

## 2014-06-20 DIAGNOSIS — R531 Weakness: Secondary | ICD-10-CM

## 2014-06-20 DIAGNOSIS — R4182 Altered mental status, unspecified: Secondary | ICD-10-CM | POA: Diagnosis present

## 2014-06-20 DIAGNOSIS — Z79899 Other long term (current) drug therapy: Secondary | ICD-10-CM | POA: Diagnosis not present

## 2014-06-20 DIAGNOSIS — R55 Syncope and collapse: Secondary | ICD-10-CM | POA: Insufficient documentation

## 2014-06-20 DIAGNOSIS — Z87448 Personal history of other diseases of urinary system: Secondary | ICD-10-CM | POA: Diagnosis not present

## 2014-06-20 DIAGNOSIS — M1990 Unspecified osteoarthritis, unspecified site: Secondary | ICD-10-CM | POA: Insufficient documentation

## 2014-06-20 LAB — URINALYSIS, ROUTINE W REFLEX MICROSCOPIC
BILIRUBIN URINE: NEGATIVE
Glucose, UA: NEGATIVE mg/dL
HGB URINE DIPSTICK: NEGATIVE
KETONES UR: NEGATIVE mg/dL
Leukocytes, UA: NEGATIVE
NITRITE: NEGATIVE
PROTEIN: NEGATIVE mg/dL
SPECIFIC GRAVITY, URINE: 1.02 (ref 1.005–1.030)
Urobilinogen, UA: 0.2 mg/dL (ref 0.0–1.0)
pH: 6.5 (ref 5.0–8.0)

## 2014-06-20 LAB — CBC WITH DIFFERENTIAL/PLATELET
Basophils Absolute: 0 10*3/uL (ref 0.0–0.1)
Basophils Relative: 1 % (ref 0–1)
EOS PCT: 5 % (ref 0–5)
Eosinophils Absolute: 0.3 10*3/uL (ref 0.0–0.7)
HCT: 36.4 % (ref 36.0–46.0)
HEMOGLOBIN: 11.3 g/dL — AB (ref 12.0–15.0)
LYMPHS ABS: 1.6 10*3/uL (ref 0.7–4.0)
LYMPHS PCT: 22 % (ref 12–46)
MCH: 27.5 pg (ref 26.0–34.0)
MCHC: 31 g/dL (ref 30.0–36.0)
MCV: 88.6 fL (ref 78.0–100.0)
Monocytes Absolute: 0.5 10*3/uL (ref 0.1–1.0)
Monocytes Relative: 7 % (ref 3–12)
NEUTROS PCT: 65 % (ref 43–77)
Neutro Abs: 4.6 10*3/uL (ref 1.7–7.7)
Platelets: 402 10*3/uL — ABNORMAL HIGH (ref 150–400)
RBC: 4.11 MIL/uL (ref 3.87–5.11)
RDW: 15.4 % (ref 11.5–15.5)
WBC: 7 10*3/uL (ref 4.0–10.5)

## 2014-06-20 LAB — COMPREHENSIVE METABOLIC PANEL
ALK PHOS: 86 U/L (ref 39–117)
ALT: 6 U/L (ref 0–35)
AST: 14 U/L (ref 0–37)
Albumin: 3.3 g/dL — ABNORMAL LOW (ref 3.5–5.2)
Anion gap: 12 (ref 5–15)
BUN: 12 mg/dL (ref 6–23)
CO2: 24 mEq/L (ref 19–32)
Calcium: 10.9 mg/dL — ABNORMAL HIGH (ref 8.4–10.5)
Chloride: 108 mEq/L (ref 96–112)
Creatinine, Ser: 0.78 mg/dL (ref 0.50–1.10)
GFR, EST AFRICAN AMERICAN: 84 mL/min — AB (ref >90)
GFR, EST NON AFRICAN AMERICAN: 73 mL/min — AB (ref >90)
GLUCOSE: 84 mg/dL (ref 70–99)
POTASSIUM: 3.9 meq/L (ref 3.7–5.3)
Sodium: 144 mEq/L (ref 137–147)
Total Bilirubin: 0.4 mg/dL (ref 0.3–1.2)
Total Protein: 6.5 g/dL (ref 6.0–8.3)

## 2014-06-20 LAB — TROPONIN I: Troponin I: <0.3 ng/mL (ref <0.30)

## 2014-06-20 NOTE — ED Provider Notes (Signed)
CSN: [id number]     Arrival date & time 06/20/14  9509 History  This chart was scribe for Maudry Diego, MD by Judithann Sauger, ED Scribe. The patient was seen in room [facility]18/[facility]18 and the patient's care was started at 7:36 AM.    Chief Complaint  Patient presents with  . Altered Mental Status   Patient is a 80 y.o. female presenting with altered mental status. The history is provided by the patient (pt c/o AMS). No language interpreter was used.  Altered Mental Status Severity:  Unable to specify Most recent episode:  Today Episode history:  Single Duration:  2 hours Chronicity:  New Context: nursing home resident   Associated symptoms: no abdominal pain, no hallucinations, no headaches, no rash and no seizures    HPI Comments: Madison Hickman is a 80 y.o. female brought in by ambulance, who presents to the Emergency Department found unresponsive about 5:45am this morning. She is from [place] and facility would like her to be checked although she was able to gain consciousness PTA.  The pt states that she has a slight cough.   Past Medical History  Diagnosis Date  . Back pain, chronic   . Headache(784.0)   . Hypertension   . Arthritis   . Meningioma 04/15/2012  . IDA (iron deficiency anemia)   . Diastolic dysfunction Oct 3267    EF 50-55%   . Hypercalcemia   . Bladder prolapse, female, acquired   . LVH (left ventricular hypertrophy) 03/22/2013    Ejection fraction 50-55%.  . Chronic diastolic heart failure    Past Surgical History  Procedure Laterality Date  . Abdominal hysterectomy    . Arm surgery    . Esophagogastroduodenoscopy  04/18/2012    TIW:PYKDX hiatal hernia/NO OBVIOUS SOURCE FOR PROFOUND ANEMIA IDENTIFIED.MODERATE GASTRITIS CONTRIBUTES TO ANEMIA/no definite stricture identified, empiric dilation performed. + H.PYLORI  . Orif hip fracture Left 09/09/2013    Procedure: OPEN TREATMENT INTERNAL FIXATION LEFT HIP FRACTURE;  Surgeon: Sanjuana Kava, MD;  Location: [facility];  Service: Orthopedics;  Laterality: Left;  . Esophagogastroduodenoscopy (egd) with esophageal dilation N/A 09/13/2013    Dr. Rourk:thin, linear ribbon-like plaques covering esophagus diffusely, esophagus patent, D1 and D2 examined with 2 juxta ampullary duodenal diverticula, s/p 85 F dilation , path with slight inflammation but no abnormalities   History reviewed. No pertinent family history. History  Substance Use Topics  . Smoking status: Never Smoker   . Smokeless tobacco: Not on file  . Alcohol Use: No   OB History    No data available     Review of Systems  Constitutional: Negative for appetite change and fatigue.  HENT: Negative for congestion, ear discharge and sinus pressure.   Eyes: Negative for discharge.  Respiratory: Positive for cough (Slight).   Cardiovascular: Negative for chest pain.  Gastrointestinal: Negative for abdominal pain and diarrhea.  Genitourinary: Negative for frequency and hematuria.  Musculoskeletal: Negative for back pain.  Skin: Negative for rash.  Neurological: Negative for seizures and headaches.  Psychiatric/Behavioral: Negative for hallucinations.      Allergies  Review of patient's allergies indicates no known allergies.  Home Medications   Prior to Admission medications   Medication Sig Start Date End Date Taking? Authorizing Provider  amLODipine (NORVASC) 5 MG tablet Take 1 tablet (5 mg total) by mouth daily. For blood pressure 01/27/13   Alycia Rossetti, MD  aspirin EC 81 MG tablet Take 81 mg by mouth daily.    Historical Provider,  MD  calcium carbonate (TUMS - DOSED IN MG ELEMENTAL CALCIUM) 500 MG chewable tablet Chew 2 tablets by mouth every 8 (eight) hours as needed for indigestion or heartburn.    Historical Provider, MD  Cholecalciferol (VITAMIN D-3) 1000 UNITS CAPS Take by mouth.    Historical Provider, MD  docusate sodium (COLACE) 100 MG capsule Take 100 mg by mouth daily as needed for constipation. 08/17/12   Delfina Redwood, MD  enalapril (VASOTEC) 10 MG tablet Take 1 tablet (10 mg total) by mouth 2 (two) times daily. 01/19/13   Alycia Rossetti, MD  enoxaparin (LOVENOX) 30 MG/0.3ML injection Inject 0.3 mLs (30 mg total) into the skin daily. 09/12/13 10/13/13  Radene Gunning, NP  feeding supplement, ENSURE COMPLETE, (ENSURE COMPLETE) LIQD Take 237 mLs by mouth 2 (two) times daily between meals. 09/12/13   Radene Gunning, NP  fentaNYL (DURAGESIC - DOSED MCG/HR) 12 MCG/HR Place 1 patch (12.5 mcg total) onto the skin every 3 (three) days. 09/12/13   Radene Gunning, NP  lidocaine (LIDODERM) 5 % Place 1 patch onto the skin daily. Remove & Discard patch within 12 hours or as directed by MD    Historical Provider, MD  LORazepam (ATIVAN) 0.5 MG tablet Take 0.25 mg by mouth every 6 (six) hours as needed for anxiety.    Historical Provider, MD  nitroGLYCERIN (NITROSTAT) 0.4 MG SL tablet Place 1 tablet (0.4 mg total) under the tongue every 5 (five) minutes as needed for chest pain. 03/22/13   [name], MD  omeprazole (PRILOSEC) 40 MG capsule Take 40 mg by mouth daily.    Historical Provider, MD  oxyCODONE-acetaminophen (PERCOCET/ROXICET) 5-325 MG per tablet Take 1 tablet by mouth every 4 (four) hours as needed for moderate pain or severe pain. 09/12/13   Radene Gunning, NP  polyethylene glycol (MIRALAX / GLYCOLAX) packet Take 17 g by mouth daily.    Historical Provider, MD  sennosides-docusate sodium (SENOKOT-S) 8.6-50 MG tablet Take 1 tablet by mouth 2 (two) times daily.    Historical Provider, MD  sertraline (ZOLOFT) 25 MG tablet Take 25 mg by mouth at bedtime.    Historical Provider, MD  Skin Protectants, Misc. (EUCERIN) cream Apply 1 application topically as needed for dry skin.    Historical Provider, MD  sodium phosphate (FLEET) 7-19 GM/118ML ENEM Place 1 enema rectally daily as needed for severe constipation. 09/14/13   Radene Gunning, NP  sucralfate (CARAFATE) 1 GM/10ML suspension Take 10 mLs (1 g total) by mouth 4 (four)  times daily -  with meals and at bedtime. 09/14/13   Radene Gunning, NP   BP 142/101 mmHg  Pulse 72  Temp(Src) 98.2 F (36.8 C) (Oral)  Resp 16  Ht 5' (1.524 m)  Wt 100 lb (45.36 kg)  BMI 19.53 kg/m2  SpO2 94% Physical Exam  Constitutional: She is oriented to person, place, and time. She appears well-developed.  HENT:  Head: Normocephalic.  Eyes: Conjunctivae and EOM are normal. No scleral icterus.  Neck: Neck supple. No thyromegaly present.  Cardiovascular: Exam reveals no gallop and no friction rub.   No murmur heard. Tachycardiac   Pulmonary/Chest: No stridor. She has no wheezes. She has no rales. She exhibits no tenderness.  Mild crackles bilateral in luncgs  Abdominal: She exhibits no distension. There is no tenderness. There is no rebound.  Musculoskeletal: Normal range of motion. She exhibits no edema.  Lymphadenopathy:    She has no cervical adenopathy.  Neurological:  She is oriented to person, place, and time. She exhibits normal muscle tone. Coordination normal.  Skin: No rash noted. No erythema.  Psychiatric: She has a normal mood and affect. Her behavior is normal.  Nursing note and vitals reviewed.   ED Course  Procedures (including critical care time) DIAGNOSTIC STUDIES: Oxygen Saturation is 94% on RA, normal by my interpretation.    COORDINATION OF CARE: 7:39 AM- Pt advised of plan for treatment and pt agrees.    Labs Review Labs Reviewed - No data to display  Imaging Review No results found.   EKG Interpretation   Date/Time:  Wednesday June 20 2014 07:41:34 EST Ventricular Rate:  70 PR Interval:  145 QRS Duration: 96 QT Interval:  405 QTC Calculation: 437 R Axis:   -65 Text Interpretation:  Sinus rhythm Left anterior fascicular block  Anteroseptal infarct, old Nonspecific T abnormalities, lateral leads  Baseline wander in lead(s) I III aVL V4 Confirmed by Stephanine Reas  MD, Wyatt Galvan  ([id number]) on 06/20/2014 8:56:44 AM      MDM   Final diagnoses:   None    Syncope,  Nl studies,  Will d/c pt back to [facility]  I personally performed the services described in this documentation, which was scribed in my presence. The recorded information has been reviewed and is accurate.    Maudry Diego, MD 06/20/14 [phone]

## 2014-06-20 NOTE — ED Notes (Signed)
Per EMS and [facility] nurse pt was found unresponsive with her eyes open around 0545 this am for a period of 15-51min. Per Nurse at bedside pt is back at her baseline. This nurse observed two fentanyl patches on patient (one dated 12/11 of her left chest and one dated 12/14 on her central chest) MD made aware and the 12/11 patch removed from pt at this time.)

## 2014-06-20 NOTE — ED Notes (Signed)
Daughter called and made aware of pt's d/c back to SNF ([facility]).

## 2014-06-20 NOTE — Discharge Instructions (Signed)
Follow up with your md in one week 

## 2014-06-20 NOTE — ED Notes (Signed)
EMS called for transport back to SNF.

## 2014-06-20 NOTE — ED Notes (Signed)
Per EMS, pt is from [place], was found unresponsive at 0545, pt usually alert, pt is now at baseline per facility, but facility would like to have the pt examined for neuro reasons.

## 2015-04-19 ENCOUNTER — Other Ambulatory Visit: Payer: Self-pay

## 2015-04-19 ENCOUNTER — Ambulatory Visit (INDEPENDENT_AMBULATORY_CARE_PROVIDER_SITE_OTHER): Payer: Medicare Other | Admitting: Gastroenterology

## 2015-04-19 ENCOUNTER — Encounter: Payer: Self-pay | Admitting: Gastroenterology

## 2015-04-19 VITALS — BP 126/82 | HR 68 | Temp 96.6°F | Ht 60.0 in | Wt 80.8 lb

## 2015-04-19 DIAGNOSIS — Z800 Family history of malignant neoplasm of digestive organs: Secondary | ICD-10-CM

## 2015-04-19 DIAGNOSIS — K921 Melena: Secondary | ICD-10-CM

## 2015-04-19 DIAGNOSIS — E611 Iron deficiency: Secondary | ICD-10-CM

## 2015-04-19 MED ORDER — BISACODYL 5 MG PO TBEC: 5.0000 mg | DELAYED_RELEASE_TABLET | Freq: Every day | ORAL | Status: AC | PRN

## 2015-04-19 MED ORDER — PEG 3350-KCL-NA BICARB-NACL 420 G PO SOLR: 4000.0000 mL | Freq: Once | ORAL | Status: AC

## 2015-04-19 NOTE — Progress Notes (Signed)
Referring Provider: Jani Gravel, MD Primary Care Physician:  Jani Gravel, MD  Primary GI: Dr. Gala Romney   Chief Complaint  Patient presents with  . melena  . low hemoglobin    HPI:   Madison Hickman is a 81 y.o. female presenting today with a history of dysphagia. seen while inpatient at [facility] on September 13, 2013. Due to complaints of choking, she had speech therapy evaluation including MBSS on 09/12/13. Barium tablet became lodged above the LES concerning for stricture. When additional food and liquid was given behind it, the esophagus filled the distal half and backflow into the cervical esophagus was noted. She was felt to be at risk for backflow into the larynx. EGD without obvious stricture noted with empiric dilation. Esophagus with ribbon-like plaques covering esophagus diffusely. Path with mild inflammation. Query pill-induced injury possibly. May 2015, BPE with esophageal dysmotility.   Now presents today at request of [facility] secondary to black stool and IDA. Discussed with nurse, who states she is heme positive. Reports that they recently started seeing "black stool". Hgb 8 mid September. Microcytic anemia Appears her last Hgb in Dec 2015 was 11 range. Ferritin low at 16, iron 16. Creatinine 0.69. To be scanned.   States hurts all over. No hematochezia. Feels constipation "just about all the time". She can't remember if she has ever had a colonoscopy. STATES HER FATHER HAD COLON CANCER. Sometimes food gets "hung up". Some rare nausea. Thinks she has a good appetite. Doesn't like to be "full".   Past Medical History  Diagnosis Date  . Back pain, chronic   . Headache(784.0)   . Hypertension   . Arthritis   . Meningioma ([place]) 04/15/2012  . IDA (iron deficiency anemia)   . Diastolic dysfunction Oct 4825    EF 50-55%   . Hypercalcemia   . Bladder prolapse, female, acquired   . LVH (left ventricular hypertrophy) 03/22/2013    Ejection fraction 50-55%.  . Chronic diastolic heart  failure [facility])     Past Surgical History  Procedure Laterality Date  . Abdominal hysterectomy    . Arm surgery    . Esophagogastroduodenoscopy  04/18/2012    OIB:BCWUG hiatal hernia/NO OBVIOUS SOURCE FOR PROFOUND ANEMIA IDENTIFIED.MODERATE GASTRITIS CONTRIBUTES TO ANEMIA/no definite stricture identified, empiric dilation performed. + H.PYLORI  . Orif hip fracture Left 09/09/2013    Procedure: OPEN TREATMENT INTERNAL FIXATION LEFT HIP FRACTURE;  Surgeon: Sanjuana Kava, MD;  Location: [facility];  Service: Orthopedics;  Laterality: Left;  . Esophagogastroduodenoscopy (egd) with esophageal dilation N/A 09/13/2013    Dr. Rourk:thin, linear ribbon-like plaques covering esophagus diffusely, esophagus patent, D1 and D2 examined with 2 juxta ampullary duodenal diverticula, s/p 52 F dilation , path with slight inflammation but no abnormalities    Current Outpatient Prescriptions  Medication Sig Dispense Refill  . amLODipine (NORVASC) 5 MG tablet Take 1 tablet (5 mg total) by mouth daily. For blood pressure 30 tablet 3  . calcium carbonate (TUMS - DOSED IN MG ELEMENTAL CALCIUM) 500 MG chewable tablet Chew 2 tablets by mouth every 8 (eight) hours as needed for indigestion or heartburn.    . Cholecalciferol (VITAMIN D-3) 1000 UNITS CAPS Take by mouth.    . diclofenac sodium (VOLTAREN) 1 % GEL Apply 2 g topically 3 (three) times daily.    . enalapril (VASOTEC) 10 MG tablet Take 1 tablet (10 mg total) by mouth 2 (two) times daily. 60 tablet 3  . feeding supplement, ENSURE COMPLETE, (ENSURE COMPLETE) LIQD  Take 237 mLs by mouth 2 (two) times daily between meals.    . fentaNYL (DURAGESIC - DOSED MCG/HR) 12 MCG/HR Place 1 patch (12.5 mcg total) onto the skin every 3 (three) days. 5 patch 0  . LORazepam (ATIVAN) 0.5 MG tablet Take 0.25 mg by mouth every 6 (six) hours as needed for anxiety.    . mirtazapine (REMERON) 7.5 MG tablet     . omeprazole (PRILOSEC) 40 MG capsule Take 40 mg by mouth daily.    Marland Kitchen  oxyCODONE-acetaminophen (PERCOCET/ROXICET) 5-325 MG per tablet Take 1 tablet by mouth every 4 (four) hours as needed for moderate pain or severe pain. 30 tablet 0  . potassium chloride (K-DUR,KLOR-CON) 10 MEQ tablet     . sennosides-docusate sodium (SENOKOT-S) 8.6-50 MG tablet Take 1 tablet by mouth 2 (two) times daily.    . sertraline (ZOLOFT) 25 MG tablet Take 25 mg by mouth at bedtime.    . bisacodyl (BISACODYL) 5 MG EC tablet Take 1 tablet (5 mg total) by mouth daily as needed for moderate constipation. 2 tablet 0  . docusate sodium (COLACE) 100 MG capsule Take 100 mg by mouth daily as needed for constipation.    . lidocaine (LIDODERM) 5 % Place 1 patch onto the skin daily. Remove & Discard patch within 12 hours or as directed by MD    . nitroGLYCERIN (NITROSTAT) 0.4 MG SL tablet Place 1 tablet (0.4 mg total) under the tongue every 5 (five) minutes as needed for chest pain. (Patient not taking: Reported on 04/19/2015)    . polyethylene glycol-electrolytes (NULYTELY/GOLYTELY) 420 G solution Take 4,000 mLs by mouth once. 4000 mL 0  . sucralfate (CARAFATE) 1 GM/10ML suspension Take 10 mLs (1 g total) by mouth 4 (four) times daily -  with meals and at bedtime. (Patient not taking: Reported on 04/19/2015) 420 mL 0   No current facility-administered medications for this visit.    Allergies as of 04/19/2015  . (No Known Allergies)    Family History  Problem Relation Age of Onset  . Colon cancer Father     Social History   Social History  . Marital Status: Widowed    Spouse Name: N/A  . Number of Children: 3  . Years of Education: N/A   Occupational History  . retired Regulatory affairs officer    Social History Main Topics  . Smoking status: Never Smoker   . Smokeless tobacco: None  . Alcohol Use: No  . Drug Use: No  . Sexual Activity: No   Other Topics Concern  . None   Social History Narrative   Lives w/ mentally-challenged son   2 other living children    Review of Systems: Gen: see  HPI, feels weak  CV: Denies chest pain, palpitations, syncope, peripheral edema, and claudication. Resp: +DOE GI: see HPI  Derm: Denies rash, itching, dry skin Psych: Denies depression, anxiety, memory loss, confusion. No homicidal or suicidal ideation.  Heme: Denies bruising, bleeding, and enlarged lymph nodes.  Physical Exam: BP 126/82 mmHg  Pulse 68  Temp(Src) 96.6 F (35.9 C)  Ht 5' (1.524 m)  Wt 80 lb 12.8 oz (36.651 kg)  BMI 15.78 kg/m2 General:   Alert and oriented. No distress noted. Pleasant and cooperative. Doesn't know year, thinks she was in [place].  Head:  Normocephalic and atraumatic. Eyes:  Conjuctiva clear without scleral icterus. Mouth:  Oral mucosa pink and moist.  Heart:  S1, S2 present without murmurs, rubs, or gallops. Regular rate and rhythm. Abdomen:  Limited exam with patient in wheelchair, as she is unable to get on exam table. +BS, soft, non-tender and non-distended. No rebound or guarding.  Msk:  Slight kyphosis  Extremities:  Arthritic changes in hand  Neurologic:  Alert and  oriented x4;  grossly normal neurologically. Psych:  Alert and cooperative. Normal mood and affect.   Hgb 8 mid September. Microcytic anemia Appears her last Hgb in Dec 2015 was 11 range. Ferritin low at 16, iron 16. Creatinine 0.69. To be scanned.

## 2015-04-19 NOTE — Assessment & Plan Note (Signed)
81 year old with new onset anemia, microcytic pattern with Hgb 8 and low iron/ferritin indicative of IDA. Lives at [facility], with nursing staff reporting recent evidence of possible melena. Heme positive stool per [facility]. No NSAIDs, and 81 mg ASA has been held since evidence of possible melena. Last EGD march 2015 with possibly pill-induced injury. Unclear when last colonoscopy was completed, and patient reports positive history of colon cancer in her father. Needs CBC now to ensure Hgb stable, with colonoscopy/EGD to follow. I have elected to order a capsule at time of EGD ONLY to be placed endoscopically if necessary, as I doubt she could do this as an outpatient due to known esophageal dysmotility. Endoscopic placement directly into duodenum would be best choice ONLY if capsule study felt to be needed.   Proceed with TCS/EGD with Dr. Gala Romney in near future: the risks, benefits, and alternatives have been discussed with the patient in detail. The patient states understanding and desires to proceed. Capsule study placed endoscopically only if no clear etiology on TCS/EGD

## 2015-04-19 NOTE — Assessment & Plan Note (Signed)
CBC now

## 2015-04-19 NOTE — Patient Instructions (Addendum)
Please have blood work done today.   We have scheduled you for a colonoscopy, possible upper endoscopy with possible capsule study of your small intestine in the near future.

## 2015-04-22 ENCOUNTER — Telehealth: Payer: Self-pay | Admitting: Internal Medicine

## 2015-04-22 NOTE — Progress Notes (Signed)
CC'ED TO PCP 

## 2015-04-22 NOTE — Telephone Encounter (Signed)
Madison Hickman with [facility] called to say that the family requested to cancel procedure with RMR on the 19th.  She asked if we could put another resident in that spot that's needing a colonoscopy and I told her that I would CJ call her back.

## 2015-04-22 NOTE — Telephone Encounter (Signed)
Spoke with Fraser Din from [facility] and pt is requesting that pt not have procedure done due to her age. They do not want to put her through the procedure

## 2015-04-24 ENCOUNTER — Ambulatory Visit (HOSPITAL_COMMUNITY): Admission: RE | Admit: 2015-04-24 | Payer: Medicare Other | Source: Ambulatory Visit | Admitting: Internal Medicine

## 2015-04-24 ENCOUNTER — Encounter (HOSPITAL_COMMUNITY): Admission: RE | Payer: Self-pay | Source: Ambulatory Visit

## 2015-04-24 SURGERY — COLONOSCOPY
Anesthesia: Moderate Sedation

## 2015-04-30 ENCOUNTER — Other Ambulatory Visit: Payer: Self-pay

## 2015-04-30 DIAGNOSIS — D509 Iron deficiency anemia, unspecified: Secondary | ICD-10-CM

## 2015-04-30 NOTE — Telephone Encounter (Signed)
Spoke with Daughter Freda Munro). Daughter is addiment about pt not being put under sedation. Will fax lab order to [facility]

## 2015-04-30 NOTE — Telephone Encounter (Signed)
She had reports of melena and a significant drop in Hgb from December to present. Also needed to have done a CBC.   IF SHE DOES NOT WANT A COLONOSCOPY, she at the minimum needs an EGD with capsule placement due to her IDA and ?melena. Also needs CBC now.

## 2015-04-30 NOTE — Telephone Encounter (Signed)
Called daughter Freda Munro. LMOM to call office back

## 2015-05-06 NOTE — Telephone Encounter (Signed)
Madison Hickman, can we get the CBC report from [facility]? Thanks!

## 2015-05-06 NOTE — Telephone Encounter (Signed)
SPOKE TO [facility] WILL FAX

## 2015-05-07 NOTE — Telephone Encounter (Signed)
GAVE RESULTS TO JULIE

## 2015-05-08 NOTE — Telephone Encounter (Signed)
Tried to call pt- LMOM 

## 2015-05-08 NOTE — Telephone Encounter (Signed)
Updated labs Oct 2016 with Hgb 9.9. Microcytic anemia. Improved from mid September when Hgb was 8. If patient's family is adamant against procedures, would recommend periodic H/H checks. With her family history of possible colon cancer, there is no way to guarantee any absence of occult malignancy. In presence of aspirin, could have gastritis, possible small bowel AVM involvement. I recommend evaluation with procedures, but we respect if the family chooses not to do so, as long as they are fully aware of ramifications. Please let family know.

## 2015-05-09 NOTE — Telephone Encounter (Signed)
Spoke with the pts daughter, she is aware of recommendations and they do not wish to proceed with any procedures d/t the pts age and her current health. She is going to call [facility] and make sure they are checking her hgb periodically.

## 2015-05-29 ENCOUNTER — Ambulatory Visit (INDEPENDENT_AMBULATORY_CARE_PROVIDER_SITE_OTHER): Payer: Medicare Other | Admitting: Nurse Practitioner

## 2015-05-29 ENCOUNTER — Encounter: Payer: Self-pay | Admitting: Nurse Practitioner

## 2015-05-29 VITALS — BP 144/96 | HR 84 | Temp 97.0°F | Ht 60.0 in | Wt 86.6 lb

## 2015-05-29 DIAGNOSIS — R05 Cough: Secondary | ICD-10-CM | POA: Diagnosis not present

## 2015-05-29 DIAGNOSIS — R1310 Dysphagia, unspecified: Secondary | ICD-10-CM

## 2015-05-29 DIAGNOSIS — R059 Cough, unspecified: Secondary | ICD-10-CM | POA: Insufficient documentation

## 2015-05-29 NOTE — Assessment & Plan Note (Signed)
Agent with a persistent dry cough during the exam. Per patient she has a chronic cough and this does not appear to be related to eating she was not eating in the office. May consider PCP evaluation and further workup for dry cough including possibly ENT evaluation or pulmonary. Return follow-up as needed.

## 2015-05-29 NOTE — Progress Notes (Signed)
Referring Provider: Jani Gravel, MD Primary Care Physician:  Jani Gravel, MD Primary GI:  Dr. Oneida Alar  Chief Complaint  Patient presents with  . Dysphagia    HPI:   81 year old female presents for follow-up on dysphagia. Last seen for dysphagia and May 2015. Endoscopy completed 09/13/2013 with 80 [place] Maloney dilation findings includes skinny esophageal plaques possible esophagitis the scans, status post impaired dilation and biopsy. Recommended add Carafate to PPI regimen, soft diet, swallowing precautions. Surgical pathology found squamous mucosa with slight inflammation. She was ultimately seen in May 2015 for noted dysphagia and pain after eating. She was evaluated and worked with by speech therapy at the facility and subsequently discharged due to good progress. Barium pill esophagram completed 11/06/2013 found moderate to markedly degraded exam, esophageal dysmotility likely presbyesophagus, no gross focal narrowing to suggest stricture. Last dietary recommendations found included. Diet with this in supervision.  Today she states she has a lot of coughing. When asked if it's better or worse then her previous coughing and dysphagia she states she hasn't seen anyone for it. States she feels like there's something in her throat that wont go away and has been going on about a year. Admits abdominal pain and occasional nausea. Denies vomiting. Still having dysphagia. Aid from facility states she is on a puree diet. Complains of persistent dry cough, not apparently related to eating. Denies chest pain, dyspnea, dizziness, lightheadedness, syncope, near syncope. Denies any other upper or lower GI symptoms.  Past Medical History  Diagnosis Date  . Back pain, chronic   . Headache(784.0)   . Hypertension   . Arthritis   . Meningioma ([place]) 04/15/2012  . IDA (iron deficiency anemia)   . Diastolic dysfunction Oct 0000000    EF 50-55%   . Hypercalcemia   . Bladder prolapse, female, acquired   .  LVH (left ventricular hypertrophy) 03/22/2013    Ejection fraction 50-55%.  . Chronic diastolic heart failure [facility])     Past Surgical History  Procedure Laterality Date  . Abdominal hysterectomy    . Arm surgery    . Esophagogastroduodenoscopy  04/18/2012    [id number] hiatal hernia/NO OBVIOUS SOURCE FOR PROFOUND ANEMIA IDENTIFIED.MODERATE GASTRITIS CONTRIBUTES TO ANEMIA/no definite stricture identified, empiric dilation performed. + H.PYLORI  . Orif hip fracture Left 09/09/2013    Procedure: OPEN TREATMENT INTERNAL FIXATION LEFT HIP FRACTURE;  Surgeon: Sanjuana Kava, MD;  Location: [facility];  Service: Orthopedics;  Laterality: Left;  . Esophagogastroduodenoscopy (egd) with esophageal dilation N/A 09/13/2013    Dr. Rourk:thin, linear ribbon-like plaques covering esophagus diffusely, esophagus patent, D1 and D2 examined with 2 juxta ampullary duodenal diverticula, s/p 52 F dilation , path with slight inflammation but no abnormalities    Current Outpatient Prescriptions  Medication Sig Dispense Refill  . amLODipine (NORVASC) 5 MG tablet Take 1 tablet (5 mg total) by mouth daily. For blood pressure 30 tablet 3  . bisacodyl (BISACODYL) 5 MG EC tablet Take 1 tablet (5 mg total) by mouth daily as needed for moderate constipation. 2 tablet 0  . calcium carbonate (TUMS - DOSED IN MG ELEMENTAL CALCIUM) 500 MG chewable tablet Chew 2 tablets by mouth every 8 (eight) hours as needed for indigestion or heartburn.    . Cholecalciferol (VITAMIN D-3) 1000 UNITS CAPS Take by mouth.    . diclofenac sodium (VOLTAREN) 1 % GEL Apply 2 g topically 3 (three) times daily.    Marland Kitchen docusate sodium (COLACE) 100 MG capsule Take 100 mg by mouth daily  as needed for constipation.    . enalapril (VASOTEC) 10 MG tablet Take 1 tablet (10 mg total) by mouth 2 (two) times daily. 60 tablet 3  . feeding supplement, ENSURE COMPLETE, (ENSURE COMPLETE) LIQD Take 237 mLs by mouth 2 (two) times daily between meals.    . fentaNYL (DURAGESIC -  DOSED MCG/HR) 12 MCG/HR Place 1 patch (12.5 mcg total) onto the skin every 3 (three) days. 5 patch 0  . lidocaine (LIDODERM) 5 % Place 1 patch onto the skin daily. Remove & Discard patch within 12 hours or as directed by MD    . LORazepam (ATIVAN) 0.5 MG tablet Take 0.25 mg by mouth every 6 (six) hours as needed for anxiety.    . mirtazapine (REMERON) 7.5 MG tablet     . omeprazole (PRILOSEC) 40 MG capsule Take 40 mg by mouth daily.    Marland Kitchen oxyCODONE-acetaminophen (PERCOCET/ROXICET) 5-325 MG per tablet Take 1 tablet by mouth every 4 (four) hours as needed for moderate pain or severe pain. 30 tablet 0  . potassium chloride (K-DUR,KLOR-CON) 10 MEQ tablet     . sennosides-docusate sodium (SENOKOT-S) 8.6-50 MG tablet Take 1 tablet by mouth 2 (two) times daily.    . sertraline (ZOLOFT) 25 MG tablet Take 25 mg by mouth at bedtime.    . nitroGLYCERIN (NITROSTAT) 0.4 MG SL tablet Place 1 tablet (0.4 mg total) under the tongue every 5 (five) minutes as needed for chest pain. (Patient not taking: Reported on 04/19/2015)    . polyethylene glycol-electrolytes (NULYTELY/GOLYTELY) 420 G solution Take 4,000 mLs by mouth once. (Patient not taking: Reported on 05/29/2015) 4000 mL 0  . sucralfate (CARAFATE) 1 GM/10ML suspension Take 10 mLs (1 g total) by mouth 4 (four) times daily -  with meals and at bedtime. (Patient not taking: Reported on 04/19/2015) 420 mL 0   No current facility-administered medications for this visit.    Allergies as of 05/29/2015  . (No Known Allergies)    Family History  Problem Relation Age of Onset  . Colon cancer Father     Social History   Social History  . Marital Status: Widowed    Spouse Name: N/A  . Number of Children: 3  . Years of Education: N/A   Occupational History  . retired Regulatory affairs officer    Social History Main Topics  . Smoking status: Never Smoker   . Smokeless tobacco: None  . Alcohol Use: No  . Drug Use: No  . Sexual Activity: No   Other Topics Concern    . None   Social History Narrative   Lives w/ mentally-challenged son   2 other living children    Review of Systems: General: Negative for fever, chills. ENT: Negative for hoarseness. CV: Negative for chest pain, angina, palpitations, peripheral edema.  Respiratory: Negative for dyspnea at rest, sputum, wheezing.  GI: See history of present illness.    Physical Exam: BP 144/96 mmHg  Pulse 84  Temp(Src) 97 F (36.1 C)  Ht 5' (1.524 m)  Wt 86 lb 9.6 oz (39.282 kg)  BMI 16.91 kg/m2 General:   Alert and oriented. Pleasant and cooperative. Sitting in wheelchair, accompanied by facility aid.  Head:  Normocephalic and atraumatic. Eyes:  Without icterus, sclera clear and conjunctiva pink.  Cardiovascular:  S1, S2 present without murmurs appreciated. Extremities without clubbing or edema. Respiratory:  Clear to auscultation bilaterally. No wheezes, rales, or rhonchi. No distress. Persistent dry non-productive cough noted during the exam. Gastrointestinal:  +BS, soft, non-tender  and non-distended. No HSM noted. No guarding or rebound. No masses appreciated.  Rectal:  Deferred  Psych:  Alert and cooperative. Normal mood and affect.    05/29/2015 9:10 AM

## 2015-05-29 NOTE — Assessment & Plan Note (Signed)
Agent with a history of dysphagia including EGD last year with empiric dilation. Follow-up barium pill esophagram found esophageal dysmotility possibly presbyesophagus and no noted stricture. She is on a pure diet currently and should continue this along with supervised eating. There is likely little offer for her dysphagia from a GI standpoint. Family is declining further EGD per notes provided by facility. The patient made good progress with speech therapy last year and can consider facility referral for speech therapy reevaluation and treatment to reinforce swallowing precautions. Return for follow-up as needed.

## 2015-05-29 NOTE — Patient Instructions (Signed)
1. Into any. Diet and swallowing precautions with supervised meals. 2. Consider speech therapy evaluation and treatment for swallowing precautions and dysphagia likely not amenable to procedural intervention. 3. Consider further evaluation of non-GI etiology behind chronic dry cough.

## 2015-05-29 NOTE — Progress Notes (Signed)
CC'ED TO PCP 

## 2016-02-13 ENCOUNTER — Telehealth: Payer: Self-pay

## 2016-02-13 NOTE — Telephone Encounter (Signed)
Noted  

## 2016-02-13 NOTE — Telephone Encounter (Signed)
Received a phone call from pt's daughter, Freda Munro, and she was inquiring about her mom's appt here on Monday 02/17/2016. She said [facility] made the appt and she does not even know why they made it. I told her the appt is for abdominal pain. She said " My mom doesn't even have abdominal pain." She said " I see her ALMOST every other day and she does not have abdominal pain."   I told her she should discuss with her mom's nurses at [facility], they may be aware of something that she is not aware of. She said " we have been through this before. I asked if she was her mom's power of attorney and she said no, she does not have one. There are 3 children in the family. I spoke with Almyra Free and she advised me to just tell Freda Munro to discuss with the nurses at [facility] and if they feel she does not need to come in then they can cancel the appt.  She said her mom is 82 years old and she does not want her to have to go through tests that she does not need.  Routing as FYI to Walden Field, NP whom pt is scheduled to see on 02/17/2016.

## 2016-02-14 NOTE — Telephone Encounter (Signed)
See cancellation note. Per Carrington Clamp called to cancel the appt for pt on Monday, said that her daughter did not want her to come.

## 2016-02-17 ENCOUNTER — Ambulatory Visit: Payer: Medicare Other | Admitting: Nurse Practitioner

## 2016-05-06 DEATH — deceased
